# Patient Record
Sex: Male | Born: 1985 | Race: White | Hispanic: No | Marital: Single | Smoking: Current every day smoker
Health system: Southern US, Community
[De-identification: ages and names within clinical notes are randomized; demographics above are authoritative.]

## PROBLEM LIST (undated history)

## (undated) HISTORY — PX: FRACTURE SURGERY: SHX138

---
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
## 2005-07-14 ENCOUNTER — Emergency Department (HOSPITAL_COMMUNITY): Admission: EM | Admit: 2005-07-14 | Discharge: 2005-07-14 | Payer: Self-pay | Admitting: *Deleted

## 2019-02-08 ENCOUNTER — Emergency Department (HOSPITAL_COMMUNITY): Payer: Self-pay

## 2019-02-08 ENCOUNTER — Other Ambulatory Visit: Payer: Self-pay

## 2019-02-08 ENCOUNTER — Emergency Department (HOSPITAL_COMMUNITY)
Admission: EM | Admit: 2019-02-08 | Discharge: 2019-02-08 | Disposition: A | Discharge status: Home / Self Care | Payer: Self-pay | Attending: Emergency Medicine | Admitting: Emergency Medicine

## 2019-02-08 ENCOUNTER — Encounter (HOSPITAL_COMMUNITY): Payer: Self-pay | Admitting: Oncology

## 2019-02-08 DIAGNOSIS — S52572A Other intraarticular fracture of lower end of left radius, initial encounter for closed fracture: Secondary | ICD-10-CM | POA: Insufficient documentation

## 2019-02-08 DIAGNOSIS — Y9351 Activity, roller skating (inline) and skateboarding: Secondary | ICD-10-CM | POA: Insufficient documentation

## 2019-02-08 DIAGNOSIS — Y999 Unspecified external cause status: Secondary | ICD-10-CM | POA: Insufficient documentation

## 2019-02-08 DIAGNOSIS — Y929 Unspecified place or not applicable: Secondary | ICD-10-CM | POA: Insufficient documentation

## 2019-02-08 DIAGNOSIS — W102XXA Fall (on)(from) incline, initial encounter: Secondary | ICD-10-CM | POA: Insufficient documentation

## 2019-02-08 LAB — CBC WITH DIFFERENTIAL/PLATELET
Abs Immature Granulocytes: 0.04 10*3/uL (ref 0.00–0.07)
Basophils Absolute: 0.1 10*3/uL (ref 0.0–0.1)
Basophils Relative: 1 %
Eosinophils Absolute: 0.2 10*3/uL (ref 0.0–0.5)
Eosinophils Relative: 1 %
HCT: 52.1 % — ABNORMAL HIGH (ref 39.0–52.0)
Hemoglobin: 18.3 g/dL — ABNORMAL HIGH (ref 13.0–17.0)
Immature Granulocytes: 0 %
Lymphocytes Relative: 31 %
Lymphs Abs: 3.6 10*3/uL (ref 0.7–4.0)
MCH: 33.2 pg (ref 26.0–34.0)
MCHC: 35.1 g/dL (ref 30.0–36.0)
MCV: 94.4 fL (ref 80.0–100.0)
Monocytes Absolute: 0.9 10*3/uL (ref 0.1–1.0)
Monocytes Relative: 8 %
Neutro Abs: 6.9 10*3/uL (ref 1.7–7.7)
Neutrophils Relative %: 59 %
Platelets: 274 10*3/uL (ref 150–400)
RBC: 5.52 MIL/uL (ref 4.22–5.81)
RDW: 13.4 % (ref 11.5–15.5)
WBC: 11.7 10*3/uL — ABNORMAL HIGH (ref 4.0–10.5)
nRBC: 0 % (ref 0.0–0.2)

## 2019-02-08 LAB — BASIC METABOLIC PANEL
Anion gap: 17 — ABNORMAL HIGH (ref 5–15)
BUN: 16 mg/dL (ref 6–20)
CO2: 17 mmol/L — ABNORMAL LOW (ref 22–32)
Calcium: 9.5 mg/dL (ref 8.9–10.3)
Chloride: 99 mmol/L (ref 98–111)
Creatinine, Ser: 1.22 mg/dL (ref 0.61–1.24)
GFR calc Af Amer: >60 mL/min (ref >60)
GFR calc non Af Amer: >60 mL/min (ref >60)
Glucose, Bld: 145 mg/dL — ABNORMAL HIGH (ref 70–99)
Potassium: 3.4 mmol/L — ABNORMAL LOW (ref 3.5–5.1)
Sodium: 133 mmol/L — ABNORMAL LOW (ref 135–145)

## 2019-02-08 MED ORDER — OXYCODONE-ACETAMINOPHEN 5-325 MG PO TABS: 1.0000 | ORAL_TABLET | ORAL | 0 refills | Status: DC | PRN

## 2019-02-08 MED ORDER — HYDROMORPHONE HCL 1 MG/ML IJ SOLN
1.0000 mg | Freq: Once | INTRAMUSCULAR | Status: AC
  Administered 2019-02-08: 20:00:00 1 mg via INTRAVENOUS
  Filled 2019-02-08: qty 1

## 2019-02-08 MED ORDER — HYDROMORPHONE HCL 1 MG/ML IJ SOLN
1.0000 mg | Freq: Once | INTRAMUSCULAR | Status: AC
  Administered 2019-02-08: 22:00:00 1 mg via INTRAVENOUS
  Filled 2019-02-08: qty 1

## 2019-02-08 MED ORDER — HYDROMORPHONE HCL 1 MG/ML IJ SOLN
1.0000 mg | Freq: Once | INTRAMUSCULAR | Status: AC
  Administered 2019-02-08: 21:00:00 1 mg via INTRAVENOUS
  Filled 2019-02-08: qty 1

## 2019-02-08 MED ORDER — PROPOFOL 10 MG/ML IV BOLUS
INTRAVENOUS | Status: AC
  Filled 2019-02-08: qty 20

## 2019-02-08 MED ORDER — HYDROMORPHONE HCL 1 MG/ML IJ SOLN
1.0000 mg | Freq: Once | INTRAMUSCULAR | Status: AC
  Administered 2019-02-08: 19:00:00 1 mg via INTRAVENOUS
  Filled 2019-02-08: qty 1

## 2019-02-08 MED ORDER — OXYCODONE-ACETAMINOPHEN 5-325 MG PO TABS: 1.0000 | ORAL_TABLET | Freq: Once | ORAL | Status: DC

## 2019-02-08 MED ORDER — PROPOFOL 10 MG/ML IV BOLUS
INTRAVENOUS | Status: AC | PRN
  Administered 2019-02-08 (×2): 10 mg via INTRAVENOUS
  Administered 2019-02-08: 50 mg via INTRAVENOUS
  Administered 2019-02-08: 20 mg via INTRAVENOUS
  Administered 2019-02-08 (×2): 10 mg via INTRAVENOUS
  Administered 2019-02-08: 20 mg via INTRAVENOUS

## 2019-02-08 MED ORDER — ONDANSETRON HCL 4 MG/2ML IJ SOLN
4.0000 mg | Freq: Once | INTRAMUSCULAR | Status: AC
  Administered 2019-02-08: 19:00:00 4 mg via INTRAVENOUS
  Filled 2019-02-08: qty 2

## 2019-02-08 NOTE — ED Notes (Signed)
Patient transported to X-ray 

## 2019-02-08 NOTE — ED Provider Notes (Signed)
Medical screening examination/treatment/procedure(s) were conducted as a shared visit with non-physician practitioner(s) and myself.  I personally evaluated the patient during the encounter. Briefly, the patient is a 33 y.o. male with no significant medical history presents the ED with left wrist deformity after skating.  Appears neurologically intact.  Normal pulses.  Obvious deformity.  X-ray showed left wrist fracture.  Patient was sedated with propofol by myself and my physician assistant reduced the fracture.  Repeat x-ray showed much improved alignment.  Patient was placed in a splint.  Will prescribe pain medication.  We will have him follow-up with hand surgery.  This chart was dictated using voice recognition software.  Despite best efforts to proofread,  errors can occur which can change the documentation meaning.   .Sedation  Date/Time: 02/08/2019 9:26 PM Performed by: Lennice Sites, DO Authorized by: Lennice Sites, DO   Consent:    Consent obtained:  Verbal   Consent given by:  Patient   Risks discussed:  Allergic reaction, dysrhythmia, inadequate sedation, nausea, prolonged hypoxia resulting in organ damage, prolonged sedation necessitating reversal, respiratory compromise necessitating ventilatory assistance and intubation and vomiting   Alternatives discussed:  Analgesia without sedation, anxiolysis and regional anesthesia Universal protocol:    Procedure explained and questions answered to patient or proxy's satisfaction: yes     Relevant documents present and verified: yes     Test results available and properly labeled: yes     Imaging studies available: yes     Required blood products, implants, devices, and special equipment available: yes     Site/side marked: yes     Immediately prior to procedure a time out was called: yes     Patient identity confirmation method:  Verbally with patient Indications:    Procedure performed:  Fracture reduction   Procedure necessitating  sedation performed by:  Physician performing sedation Pre-sedation assessment:    Time since last food or drink:  Hours   ASA classification: class 1 - normal, healthy patient     Neck mobility: normal     Mouth opening:  3 or more finger widths   Thyromental distance:  4 finger widths   Mallampati score:  I - soft palate, uvula, fauces, pillars visible   Pre-sedation assessments completed and reviewed: airway patency, cardiovascular function, hydration status, mental status, nausea/vomiting, pain level, respiratory function and temperature   Immediate pre-procedure details:    Reassessment: Patient reassessed immediately prior to procedure     Reviewed: vital signs, relevant labs/tests and NPO status     Verified: bag valve mask available, emergency equipment available, intubation equipment available, IV patency confirmed, oxygen available and suction available   Procedure details (see MAR for exact dosages):    Preoxygenation:  Nasal cannula   Sedation:  Propofol   Intra-procedure monitoring:  Blood pressure monitoring, cardiac monitor, continuous pulse oximetry, frequent LOC assessments, frequent vital sign checks and continuous capnometry   Intra-procedure events: none     Total Provider sedation time (minutes):  30 Post-procedure details:    Post-sedation assessment completed:  02/08/2019 9:27 PM   Attendance: Constant attendance by certified staff until patient recovered     Recovery: Patient returned to pre-procedure baseline     Post-sedation assessments completed and reviewed: airway patency, cardiovascular function, hydration status, mental status, nausea/vomiting, pain level, respiratory function and temperature     Patient is stable for discharge or admission: yes     Patient tolerance:  Tolerated well, no immediate complications  EKG Interpretation None           Virgina NorfolkCuratolo, Shantale Holtmeyer, DO 02/08/19 2127

## 2019-02-08 NOTE — ED Triage Notes (Signed)
Pt has deformity to left wrist.  Pt was skating states he was doing a jump, was approximately 8 feet in the air and landed on his left wrist.  Pt rates pain 10/10.

## 2019-02-08 NOTE — ED Notes (Signed)
Pt is A&O x 4.  Respirations even and unlabored.  Pt c/o pain.  Will inform [place], [place].

## 2019-02-08 NOTE — ED Provider Notes (Signed)
[facility] EM[facility]GENC[facility] Provider Note   CSN: [id number] Arrival date & time: 02/08/19  1854     History   Chief Complaint No chief complaint on file.   HPI Scott [name] is a 33 y.o. male.     The history is provided by the patient. No language interpreter was used.  Wrist Injury    33 year old male presenting for evaluation of wrist injury.  Patient states he was skating prior to arrival when he jumped off a ramp and landed awkwardly on his left wrist.  He report acute onset of severe pain about the wrist radiates to his left elbow.  Pain is severe, persistent without any numbness.  No other injury.  No shoulder pain.  No past medical history on file.  There are no active problems to display for this patient.   Past Surgical History:  Procedure Laterality Date  . FRACTURE SURGERY          Home Medications    Prior to Admission medications   Not on File    Family History No family history on file.  Social History Social History   Tobacco Use  . Smoking status: Not on file  Substance Use Topics  . Alcohol use: Not on file  . Drug use: Not on file     Allergies   Patient has no allergy information on record.   Review of Systems Review of Systems  All other systems reviewed and are negative.    Physical Exam Updated Vital Signs BP (!) 145/100 (BP Location: Right Arm)   Pulse (!) 105   Temp 98.4 F (36.9 C) (Oral)   Resp 20   SpO2 95%   Physical Exam Vitals signs and nursing note reviewed.  Constitutional:      Appearance: He is well-developed. He is diaphoretic.     Comments: Patient appears very uncomfortable, crying, diaphoretic  HENT:     Head: Atraumatic.  Eyes:     Conjunctiva/sclera: Conjunctivae normal.  Neck:     Musculoskeletal: Neck supple.  Cardiovascular:     Rate and Rhythm: Tachycardia present.  Pulmonary:     Comments: Mildly tachypneic Musculoskeletal:        General: Deformity (Left  wrist: Obvious close deformity about the wrist with intact radial pulse.  Exquisitely tender to palpation.  Tenderness to left posterior elbow on palpation with decreased range of motion.  Sensation is intact to left hand.) present.  Skin:    Findings: No rash.  Neurological:     Mental Status: He is alert.      ED Treatments / Results  Labs (all labs ordered are listed, but only abnormal results are displayed) Labs Reviewed - No data to display  EKG None  Radiology Dg Elbow Complete Left  Result Date: 02/08/2019 CLINICAL DATA:  Pain following fall EXAM: LEFT ELBOW - COMPLETE 3+ VIEW COMPARISON:  None. FINDINGS: Frontal, lateral, and bilateral oblique views were obtained. No appreciable fracture or dislocation. Joint spaces appear normal. No joint effusion. No erosive change. IMPRESSION: No fracture or dislocation. No joint effusion. No appreciable arthropathic change. Electronically Signed   By: Bretta BangWilliam  Woodruff III M.D.   On: 02/08/2019 19:50   Dg Wrist Complete Left  Addendum Date: 02/08/2019   ADDENDUM REPORT: 02/08/2019 21:11 ADDENDUM: There is also avulsion of the ulnar styloid. Electronically Signed   By: Bretta BangWilliam  Woodruff III M.D.   On: 02/08/2019 21:11   Result Date: 02/08/2019 CLINICAL DATA:  Pain following  fall EXAM: LEFT WRIST - COMPLETE 3+ VIEW COMPARISON:  None. FINDINGS: Frontal, oblique, and lateral views obtained. There is a comminuted fracture of the distal radial metaphysis with dorsal angulation distally. Fracture fragments extend into the radiocarpal joint as well as into the distal diaphysis of the radius. No other fractures are evident. No dislocation. Joint spaces appear normal. No erosive change. IMPRESSION: Comminuted fracture distal radius with dorsal angulation distally. Fracture fragments extend into the radiocarpal joint. No dislocation. No other fractures. No appreciable arthropathy. Electronically Signed: By: Lowella Grip III M.D. On: 02/08/2019 19:49    Dg Wrist Complete Left  Result Date: 02/08/2019 CLINICAL DATA:  Post reduction for fracture EXAM: LEFT WRIST - COMPLETE 3+ VIEW COMPARISON:  February 08, 2019 study obtained earlier in the day FINDINGS: Frontal, oblique, and lateral views obtained. Immobilization device in place. Comminuted fracture of the distal radius is in overall near anatomic alignment. Note that there are fracture fragments which are displaced dorsally with respect to the remainder of the radius. There is avulsion of the ulnar styloid. No dislocation. No appreciable arthropathy. IMPRESSION: The comminuted fracture of the distal radius is now in overall near anatomic alignment, although there are fracture fragments displaced dorsally in the distal metaphyseal region. There is avulsion of the ulnar styloid. No other fractures are evident. No dislocation. No evident arthropathy. Electronically Signed   By: Lowella Grip III M.D.   On: 02/08/2019 21:11    Procedures .Ortho Injury Treatment  Date/Time: 02/08/2019 9:23 PM Performed by: Domenic Moras, PA-C Authorized by: Domenic Moras, PA-C   Consent:    Consent obtained:  Written   Consent given by:  Patient   Risks discussed:  Fracture, nerve damage and recurrent dislocation   Alternatives discussed:  Referral and delayed treatmentInjury location: wrist Location details: left wrist Injury type: fracture Fracture type: distal radius and distal radius and ulnar styloid Pre-procedure neurovascular assessment: neurovascularly intact Pre-procedure distal perfusion: normal Pre-procedure neurological function: normal Pre-procedure range of motion: reduced  Anesthesia: Local anesthesia used: no  Patient sedated: Yes. Refer to sedation procedure documentation for details of sedation. Manipulation performed: yes Skeletal traction used: yes Reduction successful: yes X-ray confirmed reduction: yes Immobilization: splint and sling Splint type: sugar tong Supplies used: Ortho-Glass  Post-procedure neurovascular assessment: post-procedure neurovascularly intact Post-procedure distal perfusion: normal Post-procedure range of motion: improved Patient tolerance: patient tolerated the procedure well with no immediate complications    (including critical care time)  Medications Ordered in ED Medications  HYDROmorphone (DILAUDID) injection 1 mg (has no administration in time range)  HYDROmorphone (DILAUDID) injection 1 mg (1 mg Intravenous Given 02/08/19 1915)  ondansetron (ZOFRAN) injection 4 mg (4 mg Intravenous Given 02/08/19 1915)     Initial Impression / Assessment and Plan / ED Course  I have reviewed the triage vital signs and the nursing notes.  Pertinent labs & imaging results that were available during my care of the patient were reviewed by me and considered in my medical decision making (see chart for details).        BP (!) 153/91   Pulse 74   Temp 98.4 F (36.9 C) (Oral)   Resp 18   Ht 6' (1.829 m)   Wt 68 kg   SpO2 96%   BMI 20.34 kg/m    Final Clinical Impressions(s) / ED Diagnoses   Final diagnoses:  Other closed intra-articular fracture of distal end of left radius, initial encounter    ED Discharge Orders  Ordered    oxyCODONE-acetaminophen (PERCOCET) 5-325 MG tablet  Every 4 hours PRN,   Status:  Discontinued     02/08/19 2133    oxyCODONE-acetaminophen (PERCOCET) 5-325 MG tablet  Every 4 hours PRN     02/08/19 2137         8:18 PM Patient had a mechanical fall and injured his left wrist.  He has an obvious close deformity on initial examination.  He is neurovascular intact.  X-ray of the left wrist demonstrate comminuted fracture of the distal radius with dorsal angulation distally with fragment extending to the radiocarpal joint.  will perform conscious sedation and reduce wrist, place patient in a sugar tong splint.  9:23 PM Successful reduction of left wrist fracture with near anatomical alignment on postreduction  film.  Reduction was done under supervision and guidance of Dr. Lockie Molauratolo.  Appreciate consultation from hand specialist Dr. Izora Ribasoley who agrees to see patient next week for further care.   Fayrene Helperran, Towana Stenglein, PA-C 02/08/19 2137    Virgina Norfolkuratolo, Adam, DO 02/08/19 2352

## 2019-02-08 NOTE — Discharge Instructions (Addendum)
Please call and follow up closely with hand specialist next week for further management of your broken wrist.  Your elbow is normal.  Take pain medication as needed.

## 2020-07-07 IMAGING — CR LEFT ELBOW - COMPLETE 3+ VIEW
4 series · 4 of 4 positions shown · non-contrast
Comparison: None.

CLINICAL DATA: Pain following fall

EXAM:
LEFT ELBOW - COMPLETE 3+ VIEW

[elbow ap]
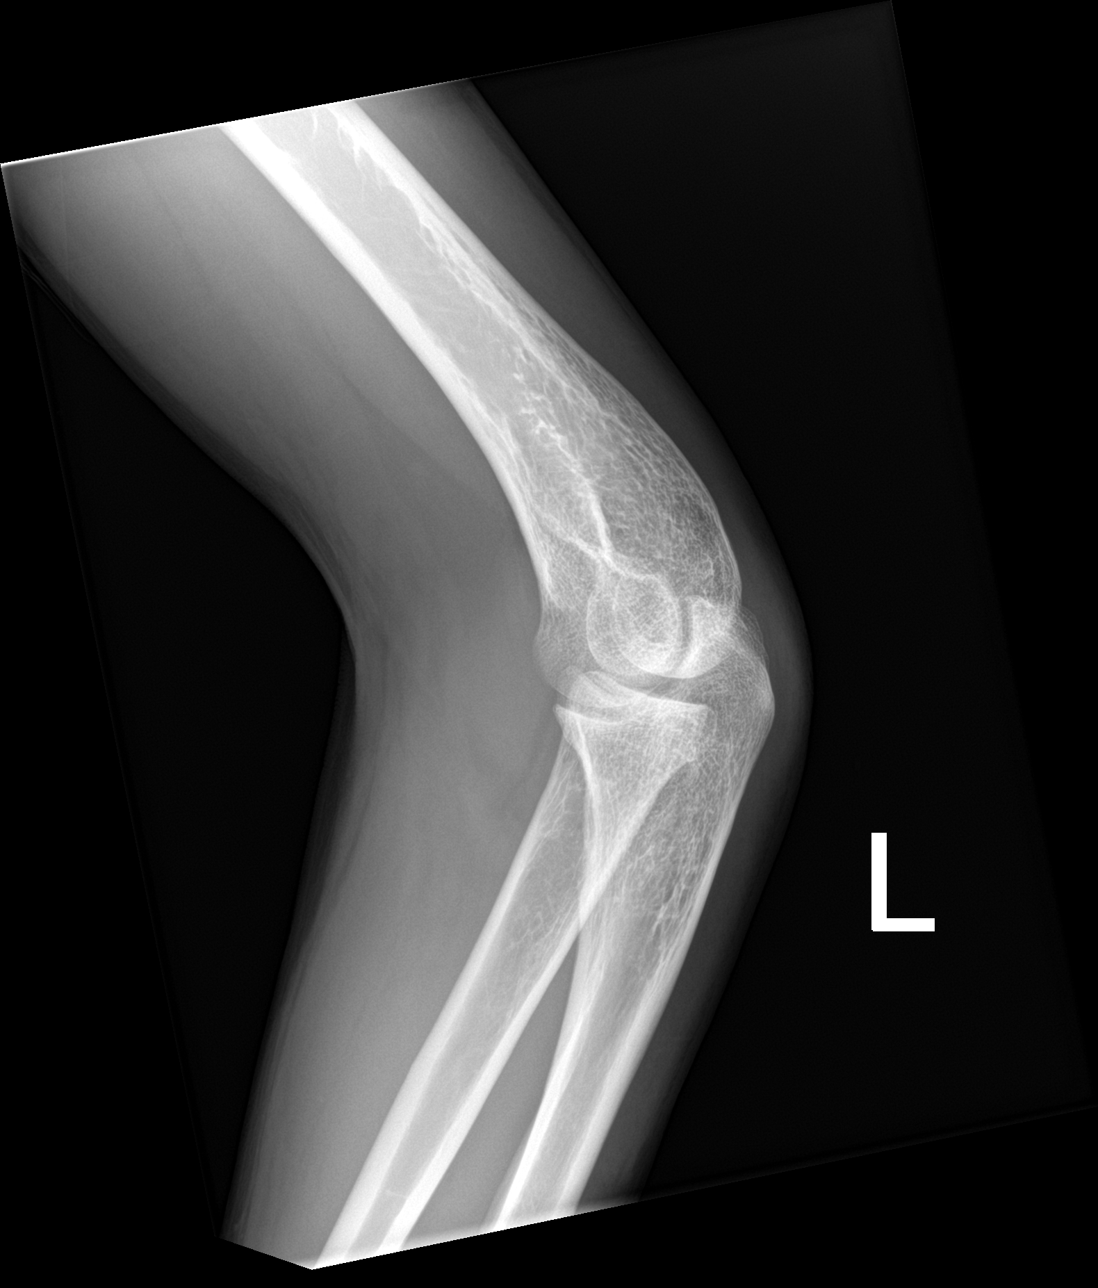

[elbow obl (1 of 2)]
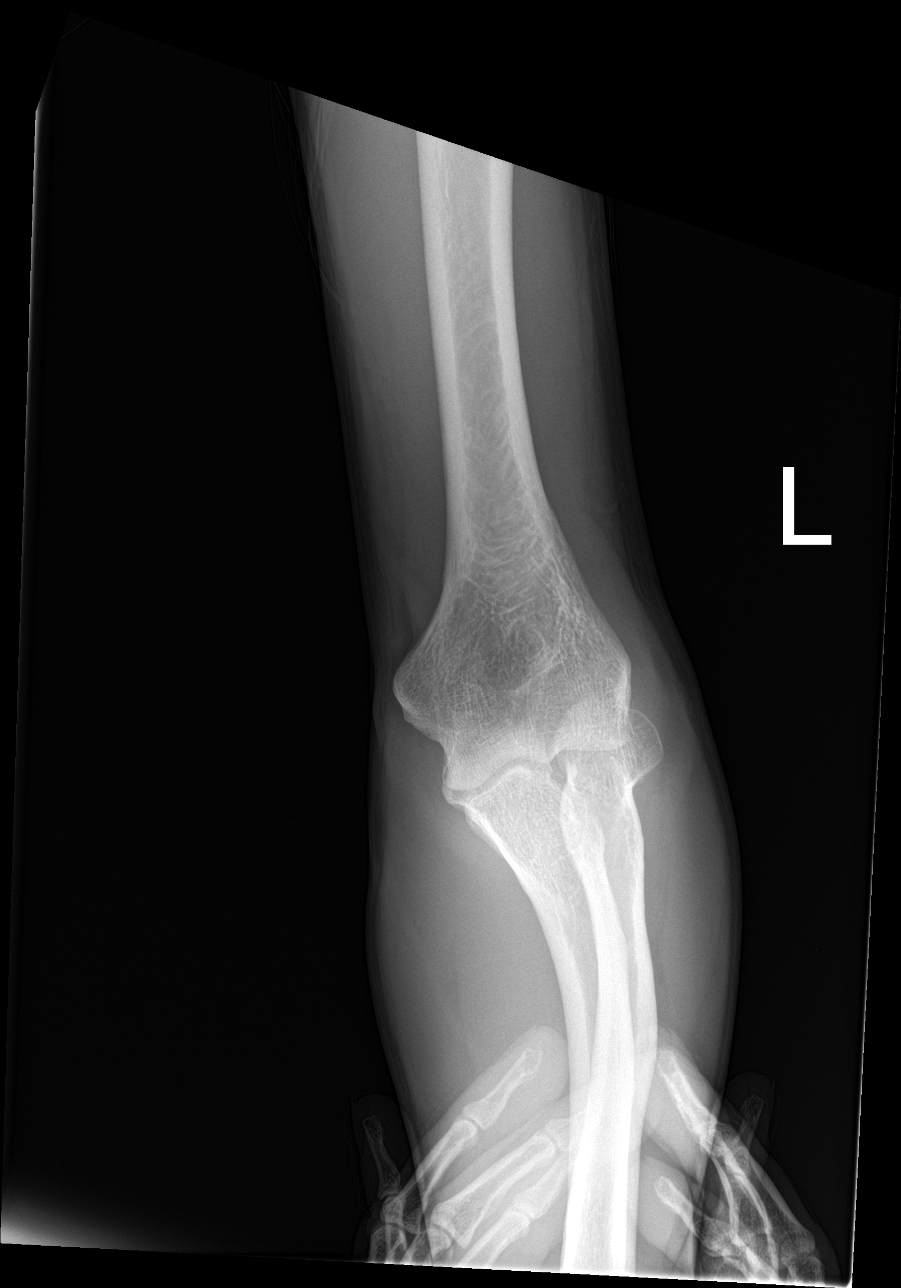

[elbow obl (2 of 2)]
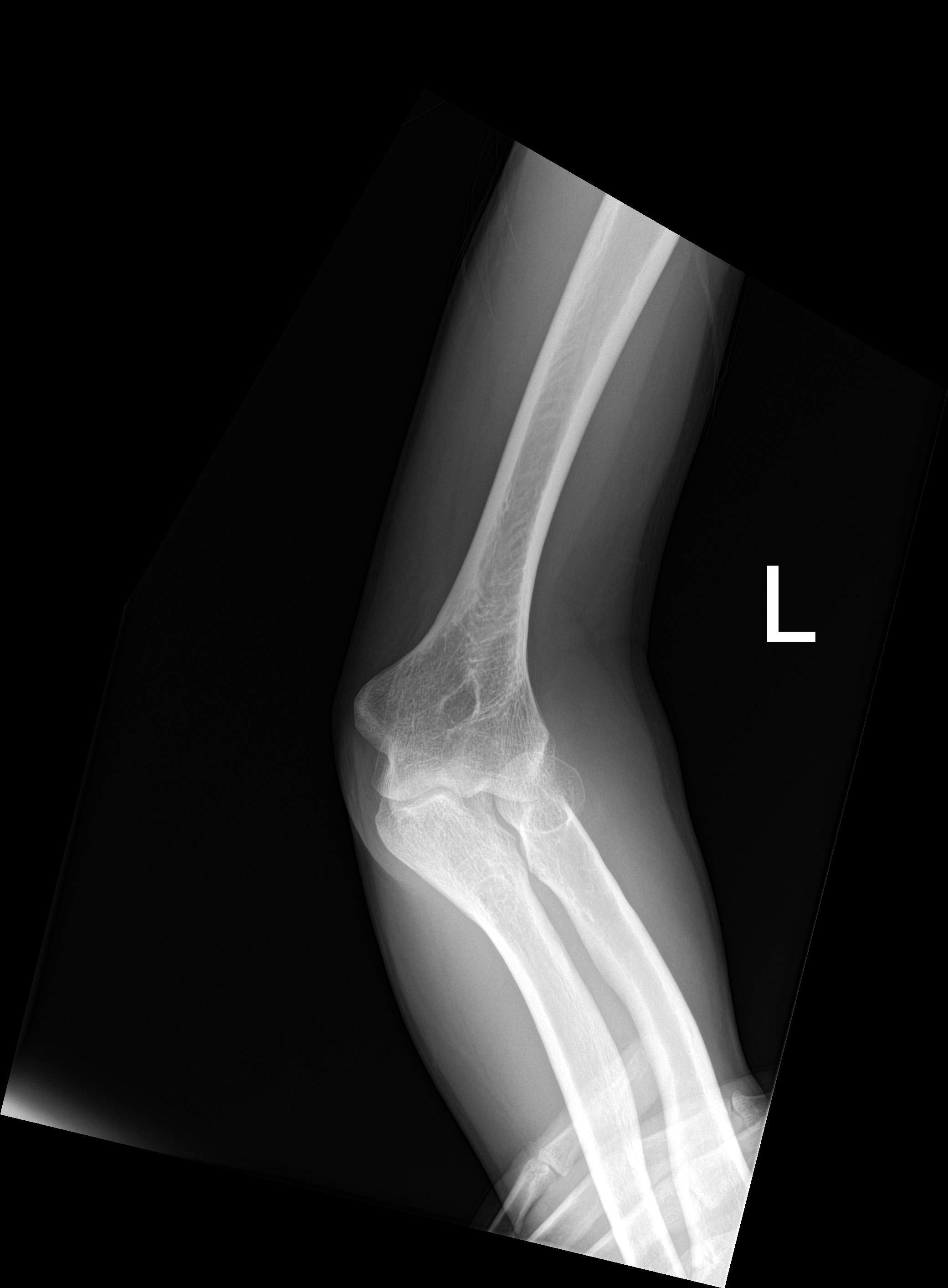

[elbow lat]
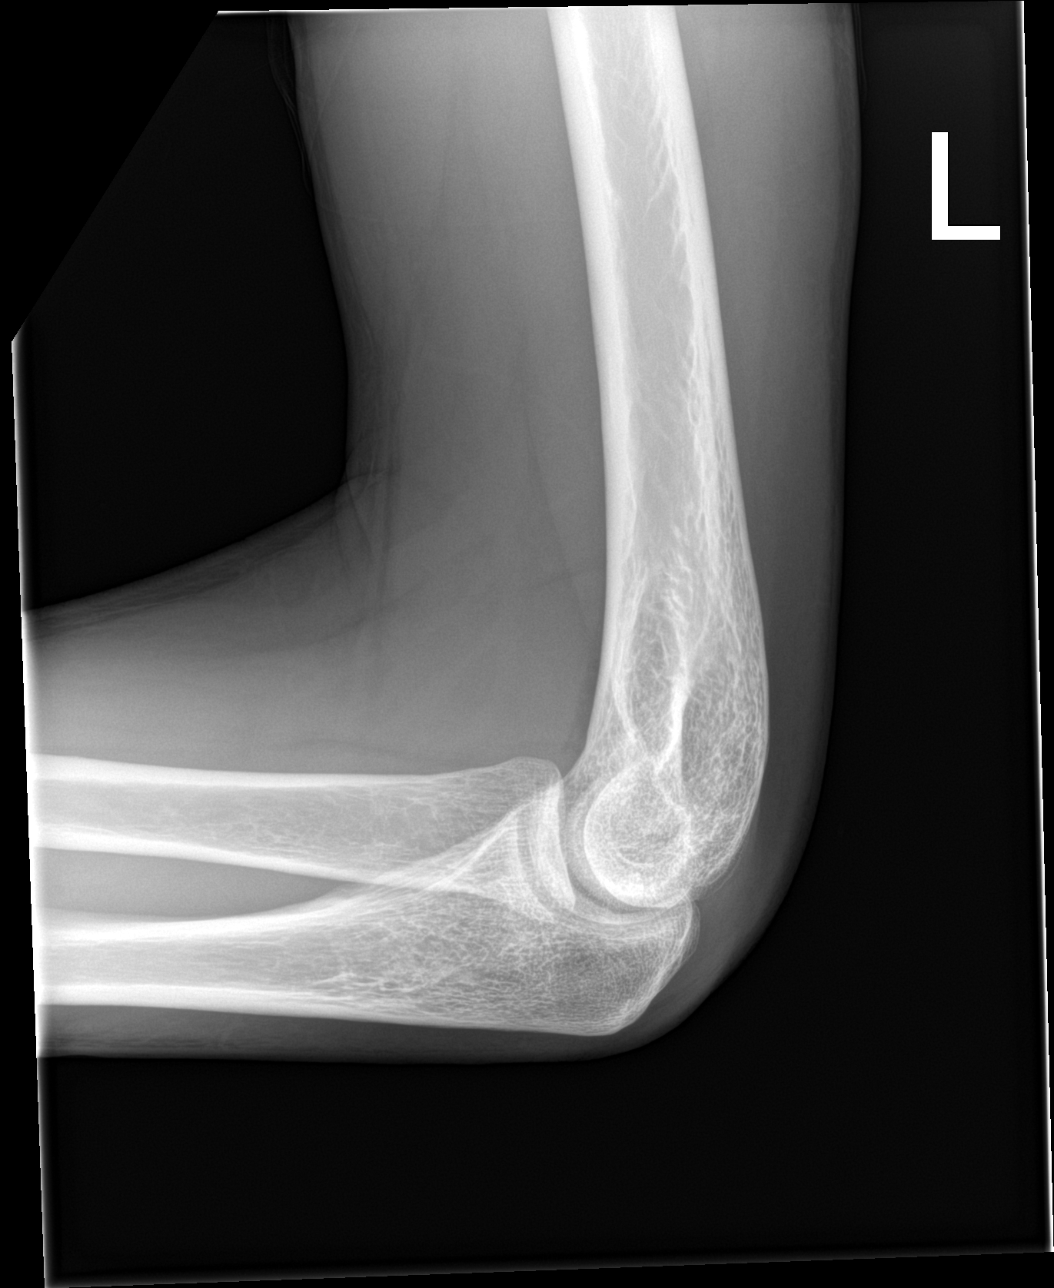

[4 of 4 positions shown; findings below may reference images not displayed]

FINDINGS: Frontal, lateral, and bilateral oblique views were obtained. No
appreciable fracture or dislocation. Joint spaces appear normal. No
joint effusion. No erosive change.
IMPRESSION: No fracture or dislocation. No joint effusion. No appreciable
arthropathic change.

## 2021-02-15 ENCOUNTER — Inpatient Hospital Stay (HOSPITAL_COMMUNITY)
Admission: EM | Admit: 2021-02-15 | Discharge: 2021-04-01 | DRG: 004 | Disposition: A | Discharge status: Rehabilitation Facility | Payer: 59 | Attending: Surgery | Admitting: Surgery

## 2021-02-15 ENCOUNTER — Emergency Department (HOSPITAL_COMMUNITY): Payer: 59

## 2021-02-15 DIAGNOSIS — Z452 Encounter for adjustment and management of vascular access device: Secondary | ICD-10-CM

## 2021-02-15 DIAGNOSIS — S020XXA Fracture of vault of skull, initial encounter for closed fracture: Secondary | ICD-10-CM | POA: Diagnosis present

## 2021-02-15 DIAGNOSIS — I82409 Acute embolism and thrombosis of unspecified deep veins of unspecified lower extremity: Secondary | ICD-10-CM | POA: Diagnosis not present

## 2021-02-15 DIAGNOSIS — S06819A Injury of right internal carotid artery, intracranial portion, not elsewhere classified with loss of consciousness of unspecified duration, initial encounter: Secondary | ICD-10-CM | POA: Diagnosis present

## 2021-02-15 DIAGNOSIS — Z4659 Encounter for fitting and adjustment of other gastrointestinal appliance and device: Secondary | ICD-10-CM

## 2021-02-15 DIAGNOSIS — N390 Urinary tract infection, site not specified: Secondary | ICD-10-CM | POA: Diagnosis not present

## 2021-02-15 DIAGNOSIS — S069X9A Unspecified intracranial injury with loss of consciousness of unspecified duration, initial encounter: Secondary | ICD-10-CM | POA: Diagnosis present

## 2021-02-15 DIAGNOSIS — Z9289 Personal history of other medical treatment: Secondary | ICD-10-CM

## 2021-02-15 DIAGNOSIS — L8990 Pressure ulcer of unspecified site, unspecified stage: Secondary | ICD-10-CM | POA: Insufficient documentation

## 2021-02-15 DIAGNOSIS — Z20822 Contact with and (suspected) exposure to covid-19: Secondary | ICD-10-CM | POA: Diagnosis present

## 2021-02-15 DIAGNOSIS — F17210 Nicotine dependence, cigarettes, uncomplicated: Secondary | ICD-10-CM | POA: Diagnosis present

## 2021-02-15 DIAGNOSIS — R41844 Frontal lobe and executive function deficit: Secondary | ICD-10-CM | POA: Diagnosis not present

## 2021-02-15 DIAGNOSIS — I82451 Acute embolism and thrombosis of right peroneal vein: Secondary | ICD-10-CM | POA: Diagnosis not present

## 2021-02-15 DIAGNOSIS — R4182 Altered mental status, unspecified: Secondary | ICD-10-CM | POA: Diagnosis present

## 2021-02-15 DIAGNOSIS — E870 Hyperosmolality and hypernatremia: Secondary | ICD-10-CM | POA: Diagnosis not present

## 2021-02-15 DIAGNOSIS — S01112A Laceration without foreign body of left eyelid and periocular area, initial encounter: Secondary | ICD-10-CM | POA: Diagnosis present

## 2021-02-15 DIAGNOSIS — S0291XA Unspecified fracture of skull, initial encounter for closed fracture: Secondary | ICD-10-CM

## 2021-02-15 DIAGNOSIS — T07XXXA Unspecified multiple injuries, initial encounter: Secondary | ICD-10-CM | POA: Diagnosis not present

## 2021-02-15 DIAGNOSIS — S42192A Fracture of other part of scapula, left shoulder, initial encounter for closed fracture: Secondary | ICD-10-CM | POA: Diagnosis present

## 2021-02-15 DIAGNOSIS — S06829A Injury of left internal carotid artery, intracranial portion, not elsewhere classified with loss of consciousness of unspecified duration, initial encounter: Secondary | ICD-10-CM | POA: Diagnosis present

## 2021-02-15 DIAGNOSIS — J9600 Acute respiratory failure, unspecified whether with hypoxia or hypercapnia: Secondary | ICD-10-CM

## 2021-02-15 DIAGNOSIS — Y92410 Unspecified street and highway as the place of occurrence of the external cause: Secondary | ICD-10-CM

## 2021-02-15 DIAGNOSIS — S0181XA Laceration without foreign body of other part of head, initial encounter: Secondary | ICD-10-CM | POA: Diagnosis present

## 2021-02-15 DIAGNOSIS — D75838 Other thrombocytosis: Secondary | ICD-10-CM | POA: Diagnosis not present

## 2021-02-15 DIAGNOSIS — S069X0S Unspecified intracranial injury without loss of consciousness, sequela: Secondary | ICD-10-CM | POA: Diagnosis not present

## 2021-02-15 DIAGNOSIS — S022XXA Fracture of nasal bones, initial encounter for closed fracture: Secondary | ICD-10-CM | POA: Diagnosis present

## 2021-02-15 DIAGNOSIS — S42022S Displaced fracture of shaft of left clavicle, sequela: Secondary | ICD-10-CM | POA: Diagnosis not present

## 2021-02-15 DIAGNOSIS — S42102A Fracture of unspecified part of scapula, left shoulder, initial encounter for closed fracture: Secondary | ICD-10-CM | POA: Diagnosis present

## 2021-02-15 DIAGNOSIS — Z23 Encounter for immunization: Secondary | ICD-10-CM

## 2021-02-15 DIAGNOSIS — J939 Pneumothorax, unspecified: Secondary | ICD-10-CM

## 2021-02-15 DIAGNOSIS — S0219XA Other fracture of base of skull, initial encounter for closed fracture: Secondary | ICD-10-CM | POA: Diagnosis present

## 2021-02-15 DIAGNOSIS — J80 Acute respiratory distress syndrome: Secondary | ICD-10-CM | POA: Diagnosis present

## 2021-02-15 DIAGNOSIS — S065X9A Traumatic subdural hemorrhage with loss of consciousness of unspecified duration, initial encounter: Secondary | ICD-10-CM | POA: Diagnosis present

## 2021-02-15 DIAGNOSIS — I609 Nontraumatic subarachnoid hemorrhage, unspecified: Secondary | ICD-10-CM

## 2021-02-15 DIAGNOSIS — Z79899 Other long term (current) drug therapy: Secondary | ICD-10-CM

## 2021-02-15 DIAGNOSIS — S066X9A Traumatic subarachnoid hemorrhage with loss of consciousness of unspecified duration, initial encounter: Principal | ICD-10-CM | POA: Diagnosis present

## 2021-02-15 DIAGNOSIS — Z930 Tracheostomy status: Secondary | ICD-10-CM

## 2021-02-15 DIAGNOSIS — J189 Pneumonia, unspecified organism: Secondary | ICD-10-CM

## 2021-02-15 DIAGNOSIS — R531 Weakness: Secondary | ICD-10-CM | POA: Diagnosis not present

## 2021-02-15 DIAGNOSIS — R000 Tachycardia, unspecified: Secondary | ICD-10-CM | POA: Diagnosis not present

## 2021-02-15 DIAGNOSIS — R339 Retention of urine, unspecified: Secondary | ICD-10-CM | POA: Diagnosis present

## 2021-02-15 DIAGNOSIS — R1310 Dysphagia, unspecified: Secondary | ICD-10-CM | POA: Diagnosis present

## 2021-02-15 DIAGNOSIS — R0902 Hypoxemia: Secondary | ICD-10-CM

## 2021-02-15 DIAGNOSIS — J15212 Pneumonia due to Methicillin resistant Staphylococcus aureus: Secondary | ICD-10-CM | POA: Diagnosis present

## 2021-02-15 DIAGNOSIS — Z01818 Encounter for other preprocedural examination: Secondary | ICD-10-CM

## 2021-02-15 DIAGNOSIS — S42112A Displaced fracture of body of scapula, left shoulder, initial encounter for closed fracture: Secondary | ICD-10-CM | POA: Diagnosis present

## 2021-02-15 DIAGNOSIS — S069XAA Unspecified intracranial injury with loss of consciousness status unknown, initial encounter: Secondary | ICD-10-CM | POA: Diagnosis present

## 2021-02-15 DIAGNOSIS — E739 Lactose intolerance, unspecified: Secondary | ICD-10-CM | POA: Diagnosis present

## 2021-02-15 DIAGNOSIS — S40212A Abrasion of left shoulder, initial encounter: Secondary | ICD-10-CM | POA: Diagnosis present

## 2021-02-15 DIAGNOSIS — J9601 Acute respiratory failure with hypoxia: Secondary | ICD-10-CM

## 2021-02-15 DIAGNOSIS — Z781 Physical restraint status: Secondary | ICD-10-CM

## 2021-02-15 DIAGNOSIS — F1020 Alcohol dependence, uncomplicated: Secondary | ICD-10-CM | POA: Diagnosis present

## 2021-02-15 DIAGNOSIS — J9690 Respiratory failure, unspecified, unspecified whether with hypoxia or hypercapnia: Secondary | ICD-10-CM

## 2021-02-15 DIAGNOSIS — R1312 Dysphagia, oropharyngeal phase: Secondary | ICD-10-CM | POA: Diagnosis not present

## 2021-02-15 DIAGNOSIS — I9589 Other hypotension: Secondary | ICD-10-CM | POA: Diagnosis not present

## 2021-02-15 DIAGNOSIS — R4020 Unspecified coma: Secondary | ICD-10-CM | POA: Diagnosis not present

## 2021-02-15 DIAGNOSIS — E876 Hypokalemia: Secondary | ICD-10-CM | POA: Diagnosis not present

## 2021-02-15 DIAGNOSIS — S069X0D Unspecified intracranial injury without loss of consciousness, subsequent encounter: Secondary | ICD-10-CM | POA: Diagnosis not present

## 2021-02-15 DIAGNOSIS — S069X3S Unspecified intracranial injury with loss of consciousness of 1 hour to 5 hours 59 minutes, sequela: Secondary | ICD-10-CM | POA: Diagnosis not present

## 2021-02-15 DIAGNOSIS — S42022A Displaced fracture of shaft of left clavicle, initial encounter for closed fracture: Secondary | ICD-10-CM | POA: Diagnosis present

## 2021-02-15 DIAGNOSIS — J151 Pneumonia due to Pseudomonas: Secondary | ICD-10-CM | POA: Diagnosis not present

## 2021-02-15 DIAGNOSIS — Z0189 Encounter for other specified special examinations: Secondary | ICD-10-CM

## 2021-02-15 DIAGNOSIS — H6093 Unspecified otitis externa, bilateral: Secondary | ICD-10-CM | POA: Diagnosis not present

## 2021-02-15 DIAGNOSIS — I10 Essential (primary) hypertension: Secondary | ICD-10-CM | POA: Diagnosis not present

## 2021-02-15 DIAGNOSIS — R140 Abdominal distension (gaseous): Secondary | ICD-10-CM

## 2021-02-15 DIAGNOSIS — S066X9D Traumatic subarachnoid hemorrhage with loss of consciousness of unspecified duration, subsequent encounter: Secondary | ICD-10-CM | POA: Diagnosis not present

## 2021-02-15 DIAGNOSIS — R509 Fever, unspecified: Secondary | ICD-10-CM

## 2021-02-15 DIAGNOSIS — I160 Hypertensive urgency: Secondary | ICD-10-CM | POA: Diagnosis present

## 2021-02-15 DIAGNOSIS — S42009A Fracture of unspecified part of unspecified clavicle, initial encounter for closed fracture: Secondary | ICD-10-CM | POA: Diagnosis present

## 2021-02-15 DIAGNOSIS — M25512 Pain in left shoulder: Secondary | ICD-10-CM | POA: Diagnosis not present

## 2021-02-15 DIAGNOSIS — T1490XA Injury, unspecified, initial encounter: Secondary | ICD-10-CM

## 2021-02-15 DIAGNOSIS — E8770 Fluid overload, unspecified: Secondary | ICD-10-CM | POA: Diagnosis not present

## 2021-02-15 DIAGNOSIS — R0602 Shortness of breath: Secondary | ICD-10-CM

## 2021-02-15 DIAGNOSIS — R0603 Acute respiratory distress: Secondary | ICD-10-CM

## 2021-02-15 DIAGNOSIS — T148XXA Other injury of unspecified body region, initial encounter: Secondary | ICD-10-CM

## 2021-02-15 LAB — I-STAT ARTERIAL BLOOD GAS, ED
Acid-Base Excess: 0 mmol/L (ref 0.0–2.0)
Bicarbonate: 26.5 mmol/L (ref 20.0–28.0)
Calcium, Ion: 1.15 mmol/L (ref 1.15–1.40)
HCT: 46 % (ref 39.0–52.0)
Hemoglobin: 15.6 g/dL (ref 13.0–17.0)
O2 Saturation: 100 %
Patient temperature: 98.6
Potassium: 3.8 mmol/L (ref 3.5–5.1)
Sodium: 139 mmol/L (ref 135–145)
TCO2: 28 mmol/L (ref 22–32)
pCO2 arterial: 50.8 mmHg — ABNORMAL HIGH (ref 32.0–48.0)
pH, Arterial: 7.325 — ABNORMAL LOW (ref 7.350–7.450)
pO2, Arterial: 573 mmHg — ABNORMAL HIGH (ref 83.0–108.0)

## 2021-02-15 LAB — I-STAT CHEM 8, ED
BUN: 16 mg/dL (ref 6–20)
Calcium, Ion: 1.06 mmol/L — ABNORMAL LOW (ref 1.15–1.40)
Chloride: 106 mmol/L (ref 98–111)
Creatinine, Ser: 1.2 mg/dL (ref 0.61–1.24)
Glucose, Bld: 133 mg/dL — ABNORMAL HIGH (ref 70–99)
HCT: 47 % (ref 39.0–52.0)
Hemoglobin: 16 g/dL (ref 13.0–17.0)
Potassium: 3.6 mmol/L (ref 3.5–5.1)
Sodium: 140 mmol/L (ref 135–145)
TCO2: 22 mmol/L (ref 22–32)

## 2021-02-15 LAB — COMPREHENSIVE METABOLIC PANEL
ALT: 29 U/L (ref 0–44)
AST: 40 U/L (ref 15–41)
Albumin: 4.2 g/dL (ref 3.5–5.0)
Alkaline Phosphatase: 96 U/L (ref 38–126)
Anion gap: 13 (ref 5–15)
BUN: 15 mg/dL (ref 6–20)
CO2: 21 mmol/L — ABNORMAL LOW (ref 22–32)
Calcium: 8.7 mg/dL — ABNORMAL LOW (ref 8.9–10.3)
Chloride: 104 mmol/L (ref 98–111)
Creatinine, Ser: 0.94 mg/dL (ref 0.61–1.24)
GFR, Estimated: >60 mL/min (ref >60)
Glucose, Bld: 132 mg/dL — ABNORMAL HIGH (ref 70–99)
Potassium: 3.5 mmol/L (ref 3.5–5.1)
Sodium: 138 mmol/L (ref 135–145)
Total Bilirubin: 0.3 mg/dL (ref 0.3–1.2)
Total Protein: 6.8 g/dL (ref 6.5–8.1)

## 2021-02-15 LAB — CBC
HCT: 46.4 % (ref 39.0–52.0)
Hemoglobin: 16 g/dL (ref 13.0–17.0)
MCH: 32.3 pg (ref 26.0–34.0)
MCHC: 34.5 g/dL (ref 30.0–36.0)
MCV: 93.5 fL (ref 80.0–100.0)
Platelets: 256 10*3/uL (ref 150–400)
RBC: 4.96 MIL/uL (ref 4.22–5.81)
RDW: 13.2 % (ref 11.5–15.5)
WBC: 16.8 10*3/uL — ABNORMAL HIGH (ref 4.0–10.5)
nRBC: 0 % (ref 0.0–0.2)

## 2021-02-15 LAB — PROTIME-INR
INR: 1 (ref 0.8–1.2)
Prothrombin Time: 13.4 seconds (ref 11.4–15.2)

## 2021-02-15 LAB — SAMPLE TO BLOOD BANK

## 2021-02-15 LAB — RESP PANEL BY RT-PCR (FLU A&B, COVID) ARPGX2
Influenza A by PCR: NEGATIVE
Influenza B by PCR: NEGATIVE
SARS Coronavirus 2 by RT PCR: NEGATIVE

## 2021-02-15 LAB — HIV ANTIBODY (ROUTINE TESTING W REFLEX): HIV Screen 4th Generation wRfx: NONREACTIVE

## 2021-02-15 LAB — ETHANOL: Alcohol, Ethyl (B): 166 mg/dL — ABNORMAL HIGH (ref <10)

## 2021-02-15 LAB — LACTIC ACID, PLASMA: Lactic Acid, Venous: 3.1 mmol/L (ref 0.5–1.9)

## 2021-02-15 MED ORDER — LIDOCAINE HCL (PF) 1 % IJ SOLN
INTRAMUSCULAR | Status: AC
  Filled 2021-02-15: qty 30

## 2021-02-15 MED ORDER — BACITRACIN ZINC 500 UNIT/GM EX OINT
TOPICAL_OINTMENT | Freq: Two times a day (BID) | CUTANEOUS | Status: DC
  Administered 2021-02-15 – 2021-03-26 (×39): 1 via TOPICAL
  Filled 2021-02-15 (×34): qty 0.9
  Filled 2021-02-15: qty 28.4
  Filled 2021-02-15 (×2): qty 0.9

## 2021-02-15 MED ORDER — LEVETIRACETAM IN NACL 500 MG/100ML IV SOLN
500.0000 mg | Freq: Two times a day (BID) | INTRAVENOUS | Status: DC
  Administered 2021-02-15 – 2021-02-21 (×12): 500 mg via INTRAVENOUS
  Filled 2021-02-15 (×13): qty 100

## 2021-02-15 MED ORDER — FENTANYL CITRATE (PF) 100 MCG/2ML IJ SOLN
50.0000 ug | Freq: Once | INTRAMUSCULAR | Status: DC
Start: 2021-02-15 — End: 2021-02-22

## 2021-02-15 MED ORDER — PROPOFOL 1000 MG/100ML IV EMUL
0.0000 ug/kg/min | INTRAVENOUS | Status: DC
  Administered 2021-02-15: 30 ug/kg/min via INTRAVENOUS

## 2021-02-15 MED ORDER — FENTANYL CITRATE (PF) 100 MCG/2ML IJ SOLN
100.0000 ug | INTRAMUSCULAR | Status: DC | PRN
Start: 2021-02-15 — End: 2021-02-15

## 2021-02-15 MED ORDER — MIDAZOLAM HCL 2 MG/2ML IJ SOLN
2.0000 mg | INTRAMUSCULAR | Status: DC | PRN
  Administered 2021-02-16 – 2021-02-27 (×9): 2 mg via INTRAVENOUS
  Filled 2021-02-15 (×5): qty 2

## 2021-02-15 MED ORDER — ORAL CARE MOUTH RINSE
15.0000 mL | OROMUCOSAL | Status: DC
  Administered 2021-02-15 – 2021-03-23 (×348): 15 mL via OROMUCOSAL

## 2021-02-15 MED ORDER — MIDAZOLAM HCL 2 MG/2ML IJ SOLN
INTRAMUSCULAR | Status: AC
  Administered 2021-02-17: 2 mg via INTRAVENOUS
  Filled 2021-02-15: qty 2

## 2021-02-15 MED ORDER — ETOMIDATE 2 MG/ML IV SOLN
INTRAVENOUS | Status: AC | PRN
Start: 2021-02-15 — End: 2021-02-15
  Administered 2021-02-15: 20 mg via INTRAVENOUS

## 2021-02-15 MED ORDER — MIDAZOLAM HCL 2 MG/2ML IJ SOLN
2.0000 mg | INTRAMUSCULAR | Status: AC | PRN
Start: 2021-02-15 — End: 2021-02-17
  Administered 2021-02-15 – 2021-02-16 (×2): 2 mg via INTRAVENOUS
  Filled 2021-02-15 (×3): qty 2

## 2021-02-15 MED ORDER — ONDANSETRON 4 MG PO TBDP: 4.0000 mg | ORAL_TABLET | Freq: Four times a day (QID) | ORAL | Status: DC | PRN

## 2021-02-15 MED ORDER — SUCCINYLCHOLINE CHLORIDE 20 MG/ML IJ SOLN
INTRAMUSCULAR | Status: AC | PRN
  Administered 2021-02-15: 120 mg via INTRAVENOUS

## 2021-02-15 MED ORDER — SODIUM CHLORIDE 0.9 % IV SOLN
INTRAVENOUS | Status: AC | PRN
  Administered 2021-02-15: 75 mL/h via INTRAVENOUS

## 2021-02-15 MED ORDER — FENTANYL BOLUS VIA INFUSION
50.0000 ug | INTRAVENOUS | Status: DC | PRN
  Administered 2021-02-21: 100 ug via INTRAVENOUS
  Administered 2021-02-21: 50 ug via INTRAVENOUS
  Administered 2021-02-21: 100 ug via INTRAVENOUS
  Administered 2021-02-21: 50 ug via INTRAVENOUS
  Administered 2021-02-21: 100 ug via INTRAVENOUS
  Administered 2021-02-22: 50 ug via INTRAVENOUS
  Filled 2021-02-15: qty 100

## 2021-02-15 MED ORDER — ACETAMINOPHEN 160 MG/5ML PO SOLN
650.0000 mg | Freq: Four times a day (QID) | ORAL | Status: DC
  Administered 2021-02-15 – 2021-02-16 (×2): 650 mg
  Filled 2021-02-15 (×2): qty 20.3

## 2021-02-15 MED ORDER — OXYCODONE HCL 5 MG/5ML PO SOLN: 5.0000 mg | ORAL | Status: DC | PRN

## 2021-02-15 MED ORDER — FENTANYL CITRATE (PF) 100 MCG/2ML IJ SOLN
100.0000 ug | INTRAMUSCULAR | Status: DC | PRN
  Administered 2021-02-15: 100 ug via INTRAVENOUS

## 2021-02-15 MED ORDER — SODIUM CHLORIDE 0.9 % IV SOLN: INTRAVENOUS | Status: DC

## 2021-02-15 MED ORDER — CHLORHEXIDINE GLUCONATE 0.12% ORAL RINSE (MEDLINE KIT)
15.0000 mL | Freq: Two times a day (BID) | OROMUCOSAL | Status: DC
Start: 2021-02-15 — End: 2021-03-23
  Administered 2021-02-15 – 2021-03-23 (×72): 15 mL via OROMUCOSAL

## 2021-02-15 MED ORDER — ONDANSETRON HCL 4 MG/2ML IJ SOLN
4.0000 mg | Freq: Four times a day (QID) | INTRAMUSCULAR | Status: DC | PRN
  Administered 2021-03-28: 4 mg via INTRAVENOUS
  Filled 2021-02-15: qty 2

## 2021-02-15 MED ORDER — IOHEXOL 300 MG/ML  SOLN
100.0000 mL | Freq: Once | INTRAMUSCULAR | Status: AC | PRN
  Administered 2021-02-15: 100 mL via INTRAVENOUS

## 2021-02-15 MED ORDER — TETANUS-DIPHTH-ACELL PERTUSSIS 5-2.5-18.5 LF-MCG/0.5 IM SUSY
0.5000 mL | PREFILLED_SYRINGE | Freq: Once | INTRAMUSCULAR | Status: AC
  Administered 2021-02-15: 0.5 mL via INTRAMUSCULAR
  Filled 2021-02-15: qty 0.5

## 2021-02-15 MED ORDER — PROPOFOL 1000 MG/100ML IV EMUL
0.0000 ug/kg/min | INTRAVENOUS | Status: DC
  Administered 2021-02-15: 45 ug/kg/min via INTRAVENOUS
  Administered 2021-02-15 – 2021-02-16 (×2): 40 ug/kg/min via INTRAVENOUS
  Administered 2021-02-16: 50 ug/kg/min via INTRAVENOUS
  Administered 2021-02-16: 40 ug/kg/min via INTRAVENOUS
  Administered 2021-02-16: 30 ug/kg/min via INTRAVENOUS
  Administered 2021-02-16: 40 ug/kg/min via INTRAVENOUS
  Administered 2021-02-17 – 2021-02-21 (×28): 50 ug/kg/min via INTRAVENOUS
  Filled 2021-02-15 (×2): qty 100
  Filled 2021-02-15: qty 200
  Filled 2021-02-15 (×19): qty 100
  Filled 2021-02-15: qty 300
  Filled 2021-02-15 (×3): qty 100
  Filled 2021-02-15: qty 300
  Filled 2021-02-15 (×3): qty 100

## 2021-02-15 MED ORDER — DOCUSATE SODIUM 50 MG/5ML PO LIQD
100.0000 mg | Freq: Two times a day (BID) | ORAL | Status: DC
  Administered 2021-02-16 – 2021-03-15 (×32): 100 mg
  Filled 2021-02-15 (×35): qty 10

## 2021-02-15 MED ORDER — CHLORHEXIDINE GLUCONATE CLOTH 2 % EX PADS
6.0000 | MEDICATED_PAD | Freq: Every day | CUTANEOUS | Status: DC
  Administered 2021-02-16 – 2021-03-01 (×14): 6 via TOPICAL

## 2021-02-15 MED ORDER — HYDRALAZINE HCL 20 MG/ML IJ SOLN
10.0000 mg | INTRAMUSCULAR | Status: AC | PRN
  Administered 2021-02-20 – 2021-02-21 (×4): 10 mg via INTRAVENOUS
  Filled 2021-02-15 (×4): qty 1

## 2021-02-15 MED ORDER — POLYETHYLENE GLYCOL 3350 17 G PO PACK
17.0000 g | PACK | Freq: Every day | ORAL | Status: DC
  Administered 2021-02-16 – 2021-03-14 (×13): 17 g
  Filled 2021-02-15 (×16): qty 1

## 2021-02-15 MED ORDER — METOPROLOL TARTRATE 5 MG/5ML IV SOLN
5.0000 mg | Freq: Four times a day (QID) | INTRAVENOUS | Status: DC | PRN
Start: 2021-02-15 — End: 2021-02-21
  Administered 2021-02-20 – 2021-02-21 (×3): 5 mg via INTRAVENOUS
  Filled 2021-02-15 (×3): qty 5

## 2021-02-15 MED ORDER — FENTANYL 2500MCG IN NS 250ML (10MCG/ML) PREMIX INFUSION
0.0000 ug/h | INTRAVENOUS | Status: DC
  Administered 2021-02-15: 100 ug/h via INTRAVENOUS
  Administered 2021-02-16: 200 ug/h via INTRAVENOUS
  Administered 2021-02-17: 250 ug/h via INTRAVENOUS
  Administered 2021-02-17: 150 ug/h via INTRAVENOUS
  Administered 2021-02-18: 200 ug/h via INTRAVENOUS
  Administered 2021-02-18: 250 ug/h via INTRAVENOUS
  Administered 2021-02-19: 200 ug/h via INTRAVENOUS
  Administered 2021-02-19: 50 ug/h via INTRAVENOUS
  Administered 2021-02-20 (×3): 400 ug/h via INTRAVENOUS
  Administered 2021-02-20: 350 ug/h via INTRAVENOUS
  Administered 2021-02-21: 40 ug/h via INTRAVENOUS
  Administered 2021-02-21: 400 ug/h via INTRAVENOUS
  Administered 2021-02-21: 350 ug/h via INTRAVENOUS
  Filled 2021-02-15 (×16): qty 250

## 2021-02-15 NOTE — Progress Notes (Signed)
Transition of Care [facility]) - CAGE-AID Screening   Patient Details  Name: Scott [name] MRN: [id number] Date of Birth: [birth year]  Transition of Care [facility]): Etheleen Sia, RN, TRN Phone Number: 02/15/2021, 10:12 PM   CAGE-AID Screening: Substance Abuse Screening unable to be completed due to: : Patient unable to participate (Ventilated and Sedated)
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
## 2021-02-15 NOTE — ED Notes (Signed)
ENT at bedside

## 2021-02-15 NOTE — Progress Notes (Signed)
Orthopedic Tech Progress Note Patient Details:  [name] 03-Dec-1985 [id number] Level 1 Trauma Patient ID: Scott [name], male   DOB: 1985-12-18, 35 y.o.   MRN: [id number]  [name] 02/15/2021, 7:41 PM
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
## 2021-02-15 NOTE — ED Notes (Signed)
Father Harlene Ramus [phone] would like an update
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
## 2021-02-16 ENCOUNTER — Inpatient Hospital Stay (HOSPITAL_COMMUNITY): Payer: 59

## 2021-02-16 LAB — URINALYSIS, ROUTINE W REFLEX MICROSCOPIC
Bilirubin Urine: NEGATIVE
Glucose, UA: NEGATIVE mg/dL
Hgb urine dipstick: NEGATIVE
Ketones, ur: 5 mg/dL — AB
Leukocytes,Ua: NEGATIVE
Nitrite: NEGATIVE
Protein, ur: NEGATIVE mg/dL
Specific Gravity, Urine: >1.046 — ABNORMAL HIGH (ref 1.005–1.030)
pH: 5 (ref 5.0–8.0)

## 2021-02-16 LAB — COMPREHENSIVE METABOLIC PANEL
ALT: 29 U/L (ref 0–44)
AST: 43 U/L — ABNORMAL HIGH (ref 15–41)
Albumin: 3.1 g/dL — ABNORMAL LOW (ref 3.5–5.0)
Alkaline Phosphatase: 61 U/L (ref 38–126)
Anion gap: 6 (ref 5–15)
BUN: 11 mg/dL (ref 6–20)
CO2: 22 mmol/L (ref 22–32)
Calcium: 7.6 mg/dL — ABNORMAL LOW (ref 8.9–10.3)
Chloride: 109 mmol/L (ref 98–111)
Creatinine, Ser: 0.75 mg/dL (ref 0.61–1.24)
GFR, Estimated: >60 mL/min (ref >60)
Glucose, Bld: 92 mg/dL (ref 70–99)
Potassium: 3.6 mmol/L (ref 3.5–5.1)
Sodium: 137 mmol/L (ref 135–145)
Total Bilirubin: 0.9 mg/dL (ref 0.3–1.2)
Total Protein: 5.2 g/dL — ABNORMAL LOW (ref 6.5–8.1)

## 2021-02-16 LAB — BASIC METABOLIC PANEL
Anion gap: 11 (ref 5–15)
BUN: 11 mg/dL (ref 6–20)
CO2: 23 mmol/L (ref 22–32)
Calcium: 8.5 mg/dL — ABNORMAL LOW (ref 8.9–10.3)
Chloride: 102 mmol/L (ref 98–111)
Creatinine, Ser: 0.71 mg/dL (ref 0.61–1.24)
GFR, Estimated: >60 mL/min (ref >60)
Glucose, Bld: 115 mg/dL — ABNORMAL HIGH (ref 70–99)
Potassium: 3.5 mmol/L (ref 3.5–5.1)
Sodium: 136 mmol/L (ref 135–145)

## 2021-02-16 LAB — TRIGLYCERIDES: Triglycerides: 211 mg/dL — ABNORMAL HIGH (ref <150)

## 2021-02-16 LAB — CBC
HCT: 42.3 % (ref 39.0–52.0)
Hemoglobin: 14.5 g/dL (ref 13.0–17.0)
MCH: 31.3 pg (ref 26.0–34.0)
MCHC: 34.3 g/dL (ref 30.0–36.0)
MCV: 91.4 fL (ref 80.0–100.0)
Platelets: 215 10*3/uL (ref 150–400)
RBC: 4.63 MIL/uL (ref 4.22–5.81)
RDW: 13.4 % (ref 11.5–15.5)
WBC: 12.6 10*3/uL — ABNORMAL HIGH (ref 4.0–10.5)
nRBC: 0 % (ref 0.0–0.2)

## 2021-02-16 LAB — RAPID URINE DRUG SCREEN, HOSP PERFORMED
Amphetamines: NOT DETECTED
Barbiturates: NOT DETECTED
Benzodiazepines: POSITIVE — AB
Cocaine: NOT DETECTED
Opiates: NOT DETECTED
Tetrahydrocannabinol: POSITIVE — AB

## 2021-02-16 LAB — MRSA NEXT GEN BY PCR, NASAL: MRSA by PCR Next Gen: NOT DETECTED

## 2021-02-16 LAB — GLUCOSE, CAPILLARY
Glucose-Capillary: 118 mg/dL — ABNORMAL HIGH (ref 70–99)
Glucose-Capillary: 134 mg/dL — ABNORMAL HIGH (ref 70–99)
Glucose-Capillary: 125 mg/dL — ABNORMAL HIGH (ref 70–99)

## 2021-02-16 LAB — PHOSPHORUS: Phosphorus: 3.1 mg/dL (ref 2.5–4.6)

## 2021-02-16 LAB — MAGNESIUM: Magnesium: 1.9 mg/dL (ref 1.7–2.4)

## 2021-02-16 MED ORDER — FOLIC ACID 1 MG PO TABS
1.0000 mg | ORAL_TABLET | Freq: Every day | ORAL | Status: DC
  Administered 2021-02-16 – 2021-04-01 (×45): 1 mg
  Filled 2021-02-16 (×45): qty 1

## 2021-02-16 MED ORDER — PROSOURCE TF PO LIQD
45.0000 mL | Freq: Two times a day (BID) | ORAL | Status: DC
  Administered 2021-02-16: 45 mL
  Filled 2021-02-16: qty 45

## 2021-02-16 MED ORDER — ADULT MULTIVITAMIN W/MINERALS CH
1.0000 | ORAL_TABLET | Freq: Every day | ORAL | Status: DC
  Administered 2021-02-16 – 2021-04-01 (×45): 1
  Filled 2021-02-16 (×45): qty 1

## 2021-02-16 MED ORDER — OXYCODONE HCL 5 MG/5ML PO SOLN
5.0000 mg | ORAL | Status: DC | PRN
  Administered 2021-02-16: 10 mg
  Administered 2021-02-16: 5 mg
  Administered 2021-02-16 – 2021-03-13 (×19): 10 mg
  Administered 2021-03-16: 5 mg
  Administered 2021-03-16 – 2021-03-22 (×3): 10 mg
  Filled 2021-02-16 (×8): qty 10
  Filled 2021-02-16: qty 5
  Filled 2021-02-16 (×17): qty 10

## 2021-02-16 MED ORDER — LORAZEPAM 2 MG/ML IJ SOLN: 1.0000 mg | INTRAMUSCULAR | Status: AC | PRN

## 2021-02-16 MED ORDER — ACETAMINOPHEN 160 MG/5ML PO SOLN
1000.0000 mg | Freq: Four times a day (QID) | ORAL | Status: DC
  Administered 2021-02-16 – 2021-03-15 (×100): 1000 mg
  Filled 2021-02-16 (×103): qty 40.6

## 2021-02-16 MED ORDER — THIAMINE HCL 100 MG PO TABS
100.0000 mg | ORAL_TABLET | Freq: Every day | ORAL | Status: DC
  Administered 2021-02-16 – 2021-04-01 (×37): 100 mg
  Filled 2021-02-16 (×37): qty 1

## 2021-02-16 MED ORDER — METHOCARBAMOL 500 MG PO TABS
1000.0000 mg | ORAL_TABLET | Freq: Three times a day (TID) | ORAL | Status: DC
Start: 2021-02-16 — End: 2021-03-26
  Administered 2021-02-16 – 2021-03-26 (×111): 1000 mg
  Filled 2021-02-16 (×109): qty 2

## 2021-02-16 MED ORDER — PIVOT 1.5 CAL PO LIQD
1000.0000 mL | ORAL | Status: DC
  Administered 2021-02-16 – 2021-02-21 (×5): 1000 mL
  Filled 2021-02-16 (×2): qty 1000

## 2021-02-16 MED ORDER — PANTOPRAZOLE SODIUM 40 MG PO PACK
40.0000 mg | PACK | Freq: Every day | ORAL | Status: DC
  Administered 2021-02-17 – 2021-02-21 (×5): 40 mg
  Filled 2021-02-16 (×5): qty 20

## 2021-02-16 MED ORDER — LORAZEPAM 1 MG PO TABS
1.0000 mg | ORAL_TABLET | ORAL | Status: AC | PRN
  Administered 2021-02-17: 2 mg
  Filled 2021-02-16: qty 2

## 2021-02-16 MED ORDER — BETHANECHOL CHLORIDE 10 MG PO TABS
10.0000 mg | ORAL_TABLET | Freq: Three times a day (TID) | ORAL | Status: DC
  Administered 2021-02-16 – 2021-02-27 (×34): 10 mg
  Filled 2021-02-16 (×35): qty 1

## 2021-02-16 MED ORDER — VITAL HIGH PROTEIN PO LIQD
1000.0000 mL | ORAL | Status: AC
  Administered 2021-02-16: 1000 mL

## 2021-02-16 MED ORDER — IOHEXOL 350 MG/ML SOLN
75.0000 mL | Freq: Once | INTRAVENOUS | Status: AC | PRN
  Administered 2021-02-16: 75 mL via INTRAVENOUS

## 2021-02-16 MED ORDER — PANTOPRAZOLE SODIUM 40 MG IV SOLR
40.0000 mg | INTRAVENOUS | Status: DC
  Administered 2021-02-16: 40 mg via INTRAVENOUS
  Filled 2021-02-16: qty 40

## 2021-02-16 MED ORDER — THIAMINE HCL 100 MG/ML IJ SOLN
100.0000 mg | Freq: Every day | INTRAMUSCULAR | Status: DC
  Administered 2021-02-23 – 2021-03-31 (×8): 100 mg via INTRAVENOUS
  Filled 2021-02-16 (×11): qty 2

## 2021-02-16 MED ORDER — QUETIAPINE FUMARATE 25 MG PO TABS
50.0000 mg | ORAL_TABLET | Freq: Two times a day (BID) | ORAL | Status: DC
  Administered 2021-02-16 (×2): 50 mg
  Filled 2021-02-16 (×2): qty 2

## 2021-02-16 NOTE — Progress Notes (Signed)
Pt placed on PSV 10/5 by MD. Pt is tolerating well at this time. RT to continue to monitor. 
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
## 2021-02-16 NOTE — Procedures (Signed)
Cortrak  Person Inserting Tube:  Kendell Bane C, RD Tube Type:  Cortrak - 43 inches Tube Size:  10 Tube Location:  Left nare Initial Placement:  Stomach Secured by: Bridle Technique Used to Measure Tube Placement:  Marking at nare/corner of mouth Cortrak Secured At:  80 cm  Cortrak Tube Team Note:  Consult received to place a Cortrak feeding tube.   X-ray is required, abdominal x-ray has been ordered by the Cortrak team. Please confirm tube placement before using the Cortrak tube.   If the tube becomes dislodged please keep the tube and contact the Cortrak team at [email or web address] (password [email or web address]) for replacement.  If after hours and replacement cannot be delayed, place a NG tube and confirm placement with an abdominal x-ray.    Cammy Copa., RD, LDN, CNSC See AMiON for contact information
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
## 2021-02-16 NOTE — Progress Notes (Signed)
Pt placed back on full vent support due to RR at 6-7 times per minute. RN at bedside.

## 2021-02-16 NOTE — Progress Notes (Signed)
Patient transported to CT scan/MRI on full vent support.

## 2021-02-16 NOTE — Progress Notes (Addendum)
Clinical update provided to patient's wife at bedside. All questions answered. Wife reports patient stopped smoking 1y ago. C-collar removed per NSGY recs. Tolerated PSV x3.5h this AM. Foley placed for urinary retention. Urecholine started. Per ortho, clavicle is operative. Cleared for OR with Ortho.   Additional critical care time:  Diamantina Monks, MD General and Trauma Surgery [facility]
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
## 2021-02-17 ENCOUNTER — Other Ambulatory Visit: Payer: Self-pay | Admitting: Otolaryngology

## 2021-02-17 LAB — BASIC METABOLIC PANEL
Anion gap: 5 (ref 5–15)
BUN: 9 mg/dL (ref 6–20)
CO2: 25 mmol/L (ref 22–32)
Calcium: 7.8 mg/dL — ABNORMAL LOW (ref 8.9–10.3)
Chloride: 108 mmol/L (ref 98–111)
Creatinine, Ser: 0.66 mg/dL (ref 0.61–1.24)
GFR, Estimated: >60 mL/min (ref >60)
Glucose, Bld: 114 mg/dL — ABNORMAL HIGH (ref 70–99)
Potassium: 3.8 mmol/L (ref 3.5–5.1)
Sodium: 138 mmol/L (ref 135–145)

## 2021-02-17 LAB — GLUCOSE, CAPILLARY
Glucose-Capillary: 169 mg/dL — ABNORMAL HIGH (ref 70–99)
Glucose-Capillary: 118 mg/dL — ABNORMAL HIGH (ref 70–99)
Glucose-Capillary: 129 mg/dL — ABNORMAL HIGH (ref 70–99)
Glucose-Capillary: 115 mg/dL — ABNORMAL HIGH (ref 70–99)
Glucose-Capillary: 147 mg/dL — ABNORMAL HIGH (ref 70–99)

## 2021-02-17 LAB — CBC
HCT: 34.9 % — ABNORMAL LOW (ref 39.0–52.0)
Hemoglobin: 12 g/dL — ABNORMAL LOW (ref 13.0–17.0)
MCH: 32.3 pg (ref 26.0–34.0)
MCHC: 34.4 g/dL (ref 30.0–36.0)
MCV: 93.8 fL (ref 80.0–100.0)
Platelets: 169 10*3/uL (ref 150–400)
RBC: 3.72 MIL/uL — ABNORMAL LOW (ref 4.22–5.81)
RDW: 13.6 % (ref 11.5–15.5)
WBC: 8.7 10*3/uL (ref 4.0–10.5)
nRBC: 0 % (ref 0.0–0.2)

## 2021-02-17 MED ORDER — CLONAZEPAM 1 MG PO TABS
1.0000 mg | ORAL_TABLET | Freq: Two times a day (BID) | ORAL | Status: DC
  Administered 2021-02-17 – 2021-02-21 (×10): 1 mg
  Filled 2021-02-17 (×5): qty 1
  Filled 2021-02-17: qty 2
  Filled 2021-02-17 (×4): qty 1

## 2021-02-17 MED ORDER — QUETIAPINE FUMARATE 100 MG PO TABS
100.0000 mg | ORAL_TABLET | Freq: Two times a day (BID) | ORAL | Status: DC
  Administered 2021-02-17 – 2021-02-21 (×10): 100 mg
  Filled 2021-02-17 (×10): qty 1

## 2021-02-17 NOTE — Progress Notes (Signed)
RR 6 breath/min, SaO2 99%. Fentanyl decreased, RT notified. Switched vent to SIMV 20, TV 600, Peep 5, FiO2 40% per RT's request.
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
## 2021-02-18 LAB — CBC
HCT: 29.8 % — ABNORMAL LOW (ref 39.0–52.0)
Hemoglobin: 10.3 g/dL — ABNORMAL LOW (ref 13.0–17.0)
MCH: 32.5 pg (ref 26.0–34.0)
MCHC: 34.6 g/dL (ref 30.0–36.0)
MCV: 94 fL (ref 80.0–100.0)
Platelets: 144 10*3/uL — ABNORMAL LOW (ref 150–400)
RBC: 3.17 MIL/uL — ABNORMAL LOW (ref 4.22–5.81)
RDW: 13.8 % (ref 11.5–15.5)
WBC: 10.4 10*3/uL (ref 4.0–10.5)
nRBC: 0 % (ref 0.0–0.2)

## 2021-02-18 LAB — BASIC METABOLIC PANEL
Anion gap: 3 — ABNORMAL LOW (ref 5–15)
BUN: 8 mg/dL (ref 6–20)
CO2: 24 mmol/L (ref 22–32)
Calcium: 7.9 mg/dL — ABNORMAL LOW (ref 8.9–10.3)
Chloride: 112 mmol/L — ABNORMAL HIGH (ref 98–111)
Creatinine, Ser: 0.65 mg/dL (ref 0.61–1.24)
GFR, Estimated: >60 mL/min (ref >60)
Glucose, Bld: 120 mg/dL — ABNORMAL HIGH (ref 70–99)
Potassium: 3.7 mmol/L (ref 3.5–5.1)
Sodium: 139 mmol/L (ref 135–145)

## 2021-02-18 LAB — MAGNESIUM: Magnesium: 2 mg/dL (ref 1.7–2.4)

## 2021-02-18 LAB — PHOSPHORUS: Phosphorus: 1.4 mg/dL — ABNORMAL LOW (ref 2.5–4.6)

## 2021-02-18 LAB — GLUCOSE, CAPILLARY
Glucose-Capillary: 100 mg/dL — ABNORMAL HIGH (ref 70–99)
Glucose-Capillary: 116 mg/dL — ABNORMAL HIGH (ref 70–99)
Glucose-Capillary: 122 mg/dL — ABNORMAL HIGH (ref 70–99)
Glucose-Capillary: 128 mg/dL — ABNORMAL HIGH (ref 70–99)
Glucose-Capillary: 130 mg/dL — ABNORMAL HIGH (ref 70–99)
Glucose-Capillary: 169 mg/dL — ABNORMAL HIGH (ref 70–99)

## 2021-02-18 MED ORDER — BUPIVACAINE-EPINEPHRINE (PF) 0.25% -1:200000 IJ SOLN
INTRAMUSCULAR | Status: AC
  Filled 2021-02-18: qty 30

## 2021-02-18 MED ORDER — POTASSIUM PHOSPHATES 15 MMOLE/5ML IV SOLN
45.0000 mmol | Freq: Once | INTRAVENOUS | Status: AC
  Administered 2021-02-18: 45 mmol via INTRAVENOUS
  Filled 2021-02-18: qty 15
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
## 2021-02-19 ENCOUNTER — Inpatient Hospital Stay (HOSPITAL_COMMUNITY): Payer: 59

## 2021-02-19 LAB — GLUCOSE, CAPILLARY
Glucose-Capillary: 133 mg/dL — ABNORMAL HIGH (ref 70–99)
Glucose-Capillary: 118 mg/dL — ABNORMAL HIGH (ref 70–99)
Glucose-Capillary: 157 mg/dL — ABNORMAL HIGH (ref 70–99)
Glucose-Capillary: 122 mg/dL — ABNORMAL HIGH (ref 70–99)
Glucose-Capillary: 123 mg/dL — ABNORMAL HIGH (ref 70–99)

## 2021-02-19 LAB — CULTURE, RESPIRATORY W GRAM STAIN: Culture: NORMAL

## 2021-02-19 LAB — CBC
HCT: 33.6 % — ABNORMAL LOW (ref 39.0–52.0)
Hemoglobin: 11.2 g/dL — ABNORMAL LOW (ref 13.0–17.0)
MCH: 31.7 pg (ref 26.0–34.0)
MCHC: 33.3 g/dL (ref 30.0–36.0)
MCV: 95.2 fL (ref 80.0–100.0)
Platelets: 150 10*3/uL (ref 150–400)
RBC: 3.53 MIL/uL — ABNORMAL LOW (ref 4.22–5.81)
RDW: 14.6 % (ref 11.5–15.5)
WBC: 9.6 10*3/uL (ref 4.0–10.5)
nRBC: 0 % (ref 0.0–0.2)

## 2021-02-19 LAB — BASIC METABOLIC PANEL
Anion gap: 7 (ref 5–15)
BUN: 9 mg/dL (ref 6–20)
CO2: 24 mmol/L (ref 22–32)
Calcium: 8.1 mg/dL — ABNORMAL LOW (ref 8.9–10.3)
Chloride: 109 mmol/L (ref 98–111)
Creatinine, Ser: 0.56 mg/dL — ABNORMAL LOW (ref 0.61–1.24)
GFR, Estimated: >60 mL/min (ref >60)
Glucose, Bld: 144 mg/dL — ABNORMAL HIGH (ref 70–99)
Potassium: 3.8 mmol/L (ref 3.5–5.1)
Sodium: 140 mmol/L (ref 135–145)

## 2021-02-19 LAB — TRIGLYCERIDES: Triglycerides: 81 mg/dL (ref <150)

## 2021-02-19 LAB — MAGNESIUM: Magnesium: 1.9 mg/dL (ref 1.7–2.4)

## 2021-02-19 LAB — PHOSPHORUS: Phosphorus: 2.7 mg/dL (ref 2.5–4.6)

## 2021-02-19 MED ORDER — MIDAZOLAM HCL 2 MG/2ML IJ SOLN
4.0000 mg | Freq: Once | INTRAMUSCULAR | Status: AC
  Administered 2021-02-20: 4 mg via INTRAVENOUS
  Filled 2021-02-19: qty 4

## 2021-02-19 MED ORDER — HALOPERIDOL LACTATE 5 MG/ML IJ SOLN
INTRAMUSCULAR | Status: AC
  Administered 2021-02-19: 10 mg
  Filled 2021-02-19: qty 1

## 2021-02-19 MED ORDER — HALOPERIDOL 5 MG PO TABS
5.0000 mg | ORAL_TABLET | Freq: Once | ORAL | Status: DC
Start: 2021-02-20 — End: 2021-02-22
  Filled 2021-02-19: qty 1

## 2021-02-19 MED ORDER — HALOPERIDOL LACTATE 5 MG/ML IJ SOLN: 10.0000 mg | Freq: Once | INTRAMUSCULAR | Status: DC

## 2021-02-19 MED ORDER — HALOPERIDOL 5 MG PO TABS
5.0000 mg | ORAL_TABLET | Freq: Once | ORAL | Status: DC
Start: 2021-02-20 — End: 2021-02-19

## 2021-02-19 MED ORDER — HALOPERIDOL LACTATE 5 MG/ML IJ SOLN
5.0000 mg | Freq: Once | INTRAMUSCULAR | Status: DC
Start: 2021-02-20 — End: 2021-02-19

## 2021-02-19 MED ORDER — HALOPERIDOL LACTATE 5 MG/ML IJ SOLN
INTRAMUSCULAR | Status: AC
  Administered 2021-02-19: 5 mg
  Filled 2021-02-19: qty 1

## 2021-02-19 NOTE — Progress Notes (Signed)
No new problems or events overnight.  Patient still requires sedation for safety.  When patient's sedation is lifted the patient is combative and difficult to control.  The patient shows no signs of higher brain function or awakening.  He will not follow commands.  Follow-up head CT scan demonstrates continued presence of significant cerebral contusions and associated edema particular in his right temporal lobe.  No new areas of significant hemorrhage or mass-effect.  Severe multifocal traumatic brain injury.  Continue supportive care.  No indications to image further.
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
## 2021-02-20 ENCOUNTER — Inpatient Hospital Stay (HOSPITAL_COMMUNITY): Payer: 59

## 2021-02-20 ENCOUNTER — Inpatient Hospital Stay: Payer: Self-pay

## 2021-02-20 LAB — BASIC METABOLIC PANEL
Anion gap: 7 (ref 5–15)
BUN: 9 mg/dL (ref 6–20)
CO2: 24 mmol/L (ref 22–32)
Calcium: 8 mg/dL — ABNORMAL LOW (ref 8.9–10.3)
Chloride: 108 mmol/L (ref 98–111)
Creatinine, Ser: 0.49 mg/dL — ABNORMAL LOW (ref 0.61–1.24)
GFR, Estimated: >60 mL/min (ref >60)
Glucose, Bld: 177 mg/dL — ABNORMAL HIGH (ref 70–99)
Potassium: 3.9 mmol/L (ref 3.5–5.1)
Sodium: 139 mmol/L (ref 135–145)

## 2021-02-20 LAB — CBC
HCT: 32.5 % — ABNORMAL LOW (ref 39.0–52.0)
Hemoglobin: 11 g/dL — ABNORMAL LOW (ref 13.0–17.0)
MCH: 32.3 pg (ref 26.0–34.0)
MCHC: 33.8 g/dL (ref 30.0–36.0)
MCV: 95.3 fL (ref 80.0–100.0)
Platelets: 191 10*3/uL (ref 150–400)
RBC: 3.41 MIL/uL — ABNORMAL LOW (ref 4.22–5.81)
RDW: 14.6 % (ref 11.5–15.5)
WBC: 11.2 10*3/uL — ABNORMAL HIGH (ref 4.0–10.5)
nRBC: 0 % (ref 0.0–0.2)

## 2021-02-20 LAB — GLUCOSE, CAPILLARY
Glucose-Capillary: 120 mg/dL — ABNORMAL HIGH (ref 70–99)
Glucose-Capillary: 140 mg/dL — ABNORMAL HIGH (ref 70–99)
Glucose-Capillary: 147 mg/dL — ABNORMAL HIGH (ref 70–99)
Glucose-Capillary: 159 mg/dL — ABNORMAL HIGH (ref 70–99)
Glucose-Capillary: 162 mg/dL — ABNORMAL HIGH (ref 70–99)
Glucose-Capillary: 169 mg/dL — ABNORMAL HIGH (ref 70–99)

## 2021-02-20 LAB — MAGNESIUM: Magnesium: 1.9 mg/dL (ref 1.7–2.4)

## 2021-02-20 LAB — PHOSPHORUS: Phosphorus: 2.1 mg/dL — ABNORMAL LOW (ref 2.5–4.6)

## 2021-02-20 MED ORDER — MIDAZOLAM-SODIUM CHLORIDE 100-0.9 MG/100ML-% IV SOLN
0.5000 mg/h | INTRAVENOUS | Status: DC
  Administered 2021-02-20: 0.5 mg/h via INTRAVENOUS
  Administered 2021-02-21: 2 mg/h via INTRAVENOUS
  Administered 2021-02-21: 10 mg/h via INTRAVENOUS
  Administered 2021-02-21: 5 mg/h via INTRAVENOUS
  Administered 2021-02-22 – 2021-02-23 (×5): 10 mg/h via INTRAVENOUS
  Administered 2021-02-24: 9 mg/h via INTRAVENOUS
  Administered 2021-02-24: 10 mg/h via INTRAVENOUS
  Administered 2021-02-25: 8 mg/h via INTRAVENOUS
  Filled 2021-02-20 (×12): qty 100

## 2021-02-20 MED ORDER — SODIUM CHLORIDE 0.9% FLUSH
10.0000 mL | Freq: Two times a day (BID) | INTRAVENOUS | Status: DC
  Administered 2021-02-20 – 2021-03-07 (×27): 10 mL
  Administered 2021-03-09: 20 mL

## 2021-02-20 MED ORDER — DEXMEDETOMIDINE HCL IN NACL 400 MCG/100ML IV SOLN
0.4000 ug/kg/h | INTRAVENOUS | Status: DC
  Administered 2021-02-20: 0.8 ug/kg/h via INTRAVENOUS
  Administered 2021-02-20: 0.4 ug/kg/h via INTRAVENOUS
  Administered 2021-02-20: 0.8 ug/kg/h via INTRAVENOUS
  Administered 2021-02-21: 1 ug/kg/h via INTRAVENOUS
  Administered 2021-02-21: 0.8 ug/kg/h via INTRAVENOUS
  Administered 2021-02-21 – 2021-02-23 (×9): 1 ug/kg/h via INTRAVENOUS
  Filled 2021-02-20 (×8): qty 100
  Filled 2021-02-20: qty 200
  Filled 2021-02-20 (×4): qty 100

## 2021-02-20 MED ORDER — VECURONIUM BROMIDE 10 MG IV SOLR
0.1000 mg/kg | INTRAVENOUS | Status: DC | PRN
  Administered 2021-02-20 – 2021-02-21 (×3): 8.4 mg via INTRAVENOUS
  Filled 2021-02-20 (×3): qty 10

## 2021-02-20 MED ORDER — SODIUM CHLORIDE 0.9% FLUSH: 10.0000 mL | INTRAVENOUS | Status: DC | PRN
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
## 2021-02-20 NOTE — Progress Notes (Signed)
Peripherally Inserted Central Catheter Placement  The IV Nurse has discussed with the patient and/or persons authorized to consent for the patient, the purpose of this procedure and the potential benefits and risks involved with this procedure.  The benefits include less needle sticks, lab draws from the catheter, and the patient may be discharged home with the catheter. Risks include, but not limited to, infection, bleeding, blood clot (thrombus formation), and puncture of an artery; nerve damage and irregular heartbeat and possibility to perform a PICC exchange if needed/ordered by physician.  Alternatives to this procedure were also discussed.  Bard Power PICC patient education guide, fact sheet on infection prevention and patient information card has been provided to patient /or left at bedside.  POA, girlfriend "wife", signed consent.  Father also at bedside agreeable to PICC placement.  PICC Placement Documentation  PICC Triple Lumen 02/20/21 PICC Right Basilic 39 cm 0 cm (Active)  Indication for Insertion or Continuance of Line Prolonged intravenous therapies 02/20/21 1446  Exposed Catheter (cm) 0 cm 02/20/21 1446  Site Assessment Clean;Dry;Intact 02/20/21 1446  Lumen #1 Status Flushed;Saline locked;Blood return noted 02/20/21 1446  Lumen #2 Status Flushed;Saline locked;Blood return noted 02/20/21 1446  Lumen #3 Status Flushed;Saline locked;Blood return noted 02/20/21 1446  Dressing Type Transparent 02/20/21 1446  Dressing Status Clean;Dry;Intact 02/20/21 1446  Antimicrobial disc in place? Yes 02/20/21 1446  Safety Lock Not Applicable 02/20/21 1446  Line Care Connections checked and tightened 02/20/21 1446  Line Adjustment (NICU/IV Team Only) No 02/20/21 1446  Dressing Intervention New dressing 02/20/21 1446  Dressing Change Due 02/27/21 02/20/21 1446       Elliot Dally 02/20/2021, 2:47 PM
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
## 2021-02-20 NOTE — Progress Notes (Signed)
Patient very agitated/restless, thrashing about, and raising up in the bed. Upon further assessment, the patient's IV access had been lost and the patient was no longer getting sedation medication.   Multiple IV access attempts were attempted by other ICU staff, and IV team was paged STAT, as well as TRN, Phineas Semen. TRN paged Dr. Fredricka Bonine for order for foot or IO IV access. During time without IV access and patient not receiving sedation medication, the patient's O2 saturation dropped to the low 70s. Respiratory was paged, and the patient was bagged. Patient continued to be very agitated/restless and 10mg  of Haldol was ordered and given IM.  IV access established by IV team and sedation medication resumed. Respiratory status improved, and O2 sats stable. Patient continued to be agitated while sedation was maxed out with propofol @50  and fentanyl @400 . Dr. paged by , TRN and orders for versed gtt and PRN paralytic were placed by Dr. .   Versed gtt started and PRN paralytic given. RN will continue to monitor.    Fredricka Bonine, RN

## 2021-02-21 ENCOUNTER — Inpatient Hospital Stay (HOSPITAL_COMMUNITY): Payer: 59

## 2021-02-21 LAB — CBC
HCT: 29.6 % — ABNORMAL LOW (ref 39.0–52.0)
Hemoglobin: 9.6 g/dL — ABNORMAL LOW (ref 13.0–17.0)
MCH: 31.8 pg (ref 26.0–34.0)
MCHC: 32.4 g/dL (ref 30.0–36.0)
MCV: 98 fL (ref 80.0–100.0)
Platelets: 208 10*3/uL (ref 150–400)
RBC: 3.02 MIL/uL — ABNORMAL LOW (ref 4.22–5.81)
RDW: 15 % (ref 11.5–15.5)
WBC: 12.3 10*3/uL — ABNORMAL HIGH (ref 4.0–10.5)
nRBC: 0 % (ref 0.0–0.2)

## 2021-02-21 LAB — POCT I-STAT 7, (LYTES, BLD GAS, ICA,H+H)
Acid-Base Excess: 0 mmol/L (ref 0.0–2.0)
Acid-Base Excess: 0 mmol/L (ref 0.0–2.0)
Acid-Base Excess: 1 mmol/L (ref 0.0–2.0)
Acid-Base Excess: 1 mmol/L (ref 0.0–2.0)
Bicarbonate: 24.5 mmol/L (ref 20.0–28.0)
Bicarbonate: 25.3 mmol/L (ref 20.0–28.0)
Bicarbonate: 27.2 mmol/L (ref 20.0–28.0)
Bicarbonate: 26.8 mmol/L (ref 20.0–28.0)
Calcium, Ion: 1.23 mmol/L (ref 1.15–1.40)
Calcium, Ion: 1.22 mmol/L (ref 1.15–1.40)
Calcium, Ion: 1.23 mmol/L (ref 1.15–1.40)
Calcium, Ion: 1.23 mmol/L (ref 1.15–1.40)
HCT: 29 % — ABNORMAL LOW (ref 39.0–52.0)
HCT: 29 % — ABNORMAL LOW (ref 39.0–52.0)
HCT: 27 % — ABNORMAL LOW (ref 39.0–52.0)
HCT: 27 % — ABNORMAL LOW (ref 39.0–52.0)
Hemoglobin: 9.9 g/dL — ABNORMAL LOW (ref 13.0–17.0)
Hemoglobin: 9.9 g/dL — ABNORMAL LOW (ref 13.0–17.0)
Hemoglobin: 9.2 g/dL — ABNORMAL LOW (ref 13.0–17.0)
Hemoglobin: 9.2 g/dL — ABNORMAL LOW (ref 13.0–17.0)
O2 Saturation: 91 %
O2 Saturation: 93 %
O2 Saturation: 96 %
O2 Saturation: 99 %
Patient temperature: 100.8
Patient temperature: 99.5
Patient temperature: 99.4
Patient temperature: 100.8
Potassium: 3.6 mmol/L (ref 3.5–5.1)
Potassium: 3.6 mmol/L (ref 3.5–5.1)
Potassium: 3.4 mmol/L — ABNORMAL LOW (ref 3.5–5.1)
Potassium: 3.7 mmol/L (ref 3.5–5.1)
Sodium: 142 mmol/L (ref 135–145)
Sodium: 142 mmol/L (ref 135–145)
Sodium: 143 mmol/L (ref 135–145)
Sodium: 143 mmol/L (ref 135–145)
TCO2: 26 mmol/L (ref 22–32)
TCO2: 27 mmol/L (ref 22–32)
TCO2: 29 mmol/L (ref 22–32)
TCO2: 28 mmol/L (ref 22–32)
pCO2 arterial: 40.4 mmHg (ref 32.0–48.0)
pCO2 arterial: 45 mmHg (ref 32.0–48.0)
pCO2 arterial: 52 mmHg — ABNORMAL HIGH (ref 32.0–48.0)
pCO2 arterial: 50.4 mmHg — ABNORMAL HIGH (ref 32.0–48.0)
pH, Arterial: 7.396 (ref 7.350–7.450)
pH, Arterial: 7.36 (ref 7.350–7.450)
pH, Arterial: 7.329 — ABNORMAL LOW (ref 7.350–7.450)
pH, Arterial: 7.34 — ABNORMAL LOW (ref 7.350–7.450)
pO2, Arterial: 64 mmHg — ABNORMAL LOW (ref 83.0–108.0)
pO2, Arterial: 70 mmHg — ABNORMAL LOW (ref 83.0–108.0)
pO2, Arterial: 88 mmHg (ref 83.0–108.0)
pO2, Arterial: 137 mmHg — ABNORMAL HIGH (ref 83.0–108.0)

## 2021-02-21 LAB — GLUCOSE, CAPILLARY
Glucose-Capillary: 114 mg/dL — ABNORMAL HIGH (ref 70–99)
Glucose-Capillary: 127 mg/dL — ABNORMAL HIGH (ref 70–99)
Glucose-Capillary: 136 mg/dL — ABNORMAL HIGH (ref 70–99)
Glucose-Capillary: 156 mg/dL — ABNORMAL HIGH (ref 70–99)
Glucose-Capillary: 159 mg/dL — ABNORMAL HIGH (ref 70–99)
Glucose-Capillary: 184 mg/dL — ABNORMAL HIGH (ref 70–99)
Glucose-Capillary: 98 mg/dL (ref 70–99)

## 2021-02-21 LAB — BASIC METABOLIC PANEL
Anion gap: 5 (ref 5–15)
BUN: 11 mg/dL (ref 6–20)
CO2: 23 mmol/L (ref 22–32)
Calcium: 7.6 mg/dL — ABNORMAL LOW (ref 8.9–10.3)
Chloride: 110 mmol/L (ref 98–111)
Creatinine, Ser: 0.44 mg/dL — ABNORMAL LOW (ref 0.61–1.24)
GFR, Estimated: >60 mL/min (ref >60)
Glucose, Bld: 148 mg/dL — ABNORMAL HIGH (ref 70–99)
Potassium: 3.4 mmol/L — ABNORMAL LOW (ref 3.5–5.1)
Sodium: 138 mmol/L (ref 135–145)

## 2021-02-21 MED ORDER — ASPIRIN 325 MG PO TABS
325.0000 mg | ORAL_TABLET | Freq: Every day | ORAL | Status: DC
  Administered 2021-02-21: 325 mg
  Filled 2021-02-21: qty 1

## 2021-02-21 MED ORDER — SODIUM CHLORIDE 0.9% FLUSH: 5.0000 mL | Freq: Once | INTRAVENOUS | Status: DC

## 2021-02-21 MED ORDER — ARTIFICIAL TEARS OPHTHALMIC OINT
1.0000 "application " | TOPICAL_OINTMENT | Freq: Three times a day (TID) | OPHTHALMIC | Status: DC
  Administered 2021-02-21: 1 via OPHTHALMIC
  Filled 2021-02-21: qty 3.5

## 2021-02-21 MED ORDER — LEVETIRACETAM 100 MG/ML PO SOLN: 500.0000 mg | Freq: Two times a day (BID) | ORAL | Status: DC

## 2021-02-21 MED ORDER — ESMOLOL HCL-SODIUM CHLORIDE 2000 MG/100ML IV SOLN
25.0000 ug/kg/min | INTRAVENOUS | Status: DC
Start: 2021-02-21 — End: 2021-02-27
  Administered 2021-02-21: 25 ug/kg/min via INTRAVENOUS
  Administered 2021-02-22 (×2): 125 ug/kg/min via INTRAVENOUS
  Administered 2021-02-22: 25 ug/kg/min via INTRAVENOUS
  Administered 2021-02-22: 75 ug/kg/min via INTRAVENOUS
  Administered 2021-02-22 – 2021-02-23 (×2): 125 ug/kg/min via INTRAVENOUS
  Administered 2021-02-23 (×5): 100 ug/kg/min via INTRAVENOUS
  Administered 2021-02-24 (×2): 125 ug/kg/min via INTRAVENOUS
  Administered 2021-02-24: 175 ug/kg/min via INTRAVENOUS
  Administered 2021-02-24 – 2021-02-25 (×5): 150 ug/kg/min via INTRAVENOUS
  Administered 2021-02-25: 175 ug/kg/min via INTRAVENOUS
  Administered 2021-02-25: 150 ug/kg/min via INTRAVENOUS
  Administered 2021-02-25: 250 ug/kg/min via INTRAVENOUS
  Administered 2021-02-25: 150 ug/kg/min via INTRAVENOUS
  Administered 2021-02-26: 200 ug/kg/min via INTRAVENOUS
  Administered 2021-02-26 (×5): 300 ug/kg/min via INTRAVENOUS
  Administered 2021-02-26: 200 ug/kg/min via INTRAVENOUS
  Administered 2021-02-26 (×2): 300 ug/kg/min via INTRAVENOUS
  Administered 2021-02-26 (×2): 200 ug/kg/min via INTRAVENOUS
  Administered 2021-02-26 – 2021-02-27 (×9): 300 ug/kg/min via INTRAVENOUS
  Filled 2021-02-21 (×55): qty 100

## 2021-02-21 MED ORDER — VANCOMYCIN HCL 2000 MG/400ML IV SOLN
2000.0000 mg | Freq: Once | INTRAVENOUS | Status: AC
  Administered 2021-02-21: 2000 mg via INTRAVENOUS
  Filled 2021-02-21: qty 400

## 2021-02-21 MED ORDER — ENOXAPARIN SODIUM 40 MG/0.4ML IJ SOSY: 40.0000 mg | PREFILLED_SYRINGE | Freq: Once | INTRAMUSCULAR | Status: DC

## 2021-02-21 MED ORDER — POTASSIUM CHLORIDE 10 MEQ/100ML IV SOLN
10.0000 meq | INTRAVENOUS | Status: AC
  Administered 2021-02-21 (×2): 10 meq via INTRAVENOUS
  Filled 2021-02-21 (×3): qty 100

## 2021-02-21 MED ORDER — VANCOMYCIN HCL 1250 MG/250ML IV SOLN
1250.0000 mg | Freq: Three times a day (TID) | INTRAVENOUS | Status: DC
  Administered 2021-02-21 – 2021-02-22 (×4): 1250 mg via INTRAVENOUS
  Filled 2021-02-21 (×5): qty 250

## 2021-02-21 MED ORDER — PROPOFOL 1000 MG/100ML IV EMUL
5.0000 ug/kg/min | INTRAVENOUS | Status: DC
  Administered 2021-02-21: 7 ug/kg/min via INTRAVENOUS
  Administered 2021-02-21: 75 ug/kg/min via INTRAVENOUS
  Administered 2021-02-22: 60 ug/kg/min via INTRAVENOUS
  Administered 2021-02-22: 75 ug/kg/min via INTRAVENOUS
  Administered 2021-02-22 (×2): 65 ug/kg/min via INTRAVENOUS
  Filled 2021-02-21: qty 300
  Filled 2021-02-21: qty 200
  Filled 2021-02-21 (×2): qty 100

## 2021-02-21 MED ORDER — SODIUM CHLORIDE 0.9 % IV SOLN: INTRAVENOUS | Status: DC | PRN

## 2021-02-21 MED ORDER — FUROSEMIDE 10 MG/ML IJ SOLN
INTRAMUSCULAR | Status: AC
  Administered 2021-02-21: 20 mg
  Filled 2021-02-21: qty 2

## 2021-02-21 MED ORDER — ENOXAPARIN SODIUM 30 MG/0.3ML IJ SOSY
30.0000 mg | PREFILLED_SYRINGE | Freq: Two times a day (BID) | INTRAMUSCULAR | Status: DC
  Administered 2021-02-21 – 2021-02-26 (×10): 30 mg via SUBCUTANEOUS
  Filled 2021-02-21 (×10): qty 0.3

## 2021-02-21 MED ORDER — METOPROLOL TARTRATE 5 MG/5ML IV SOLN
5.0000 mg | INTRAVENOUS | Status: DC | PRN
  Administered 2021-02-21 – 2021-02-26 (×5): 5 mg via INTRAVENOUS
  Filled 2021-02-21 (×5): qty 5

## 2021-02-21 MED ORDER — ARTIFICIAL TEARS OPHTHALMIC OINT
1.0000 | TOPICAL_OINTMENT | Freq: Three times a day (TID) | OPHTHALMIC | Status: DC
Start: 2021-02-21 — End: 2021-02-27
  Administered 2021-02-21 – 2021-02-26 (×14): 1 via OPHTHALMIC
  Filled 2021-02-21 (×4): qty 3.5

## 2021-02-21 MED ORDER — SODIUM CHLORIDE 0.9 % IV SOLN
0.0000 ug/kg/min | INTRAVENOUS | Status: DC
  Administered 2021-02-21: 3 ug/kg/min via INTRAVENOUS
  Administered 2021-02-22: 3.5 ug/kg/min via INTRAVENOUS
  Administered 2021-02-22: 3 ug/kg/min via INTRAVENOUS
  Administered 2021-02-23 – 2021-02-24 (×2): 3.5 ug/kg/min via INTRAVENOUS
  Filled 2021-02-21 (×6): qty 20

## 2021-02-21 MED ORDER — SODIUM CHLORIDE 0.9 % IV SOLN
2.0000 g | Freq: Three times a day (TID) | INTRAVENOUS | Status: AC
  Administered 2021-02-21 – 2021-03-02 (×29): 2 g via INTRAVENOUS
  Filled 2021-02-21 (×29): qty 2

## 2021-02-21 MED ORDER — PROSOURCE TF PO LIQD
45.0000 mL | Freq: Every day | ORAL | Status: DC
  Administered 2021-02-21 – 2021-03-02 (×9): 45 mL
  Filled 2021-02-21 (×9): qty 45

## 2021-02-21 MED ORDER — LEVETIRACETAM 100 MG/ML PO SOLN
500.0000 mg | Freq: Two times a day (BID) | ORAL | Status: DC
  Administered 2021-02-21: 500 mg
  Filled 2021-02-21: qty 5

## 2021-02-21 MED ORDER — IPRATROPIUM-ALBUTEROL 0.5-2.5 (3) MG/3ML IN SOLN
3.0000 mL | RESPIRATORY_TRACT | Status: DC
  Administered 2021-02-21 – 2021-02-24 (×17): 3 mL via RESPIRATORY_TRACT
  Filled 2021-02-21 (×18): qty 3
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
## 2021-02-21 NOTE — Procedures (Signed)
Arterial Catheter Insertion Procedure Note  Scott [name]  [id number]  09-02-1985  Date:02/21/21  Time:4:04 PM    Provider Performing: Elyn Peers    Procedure: Insertion of Arterial Line (12197) without US guidance  Indication(s) Blood pressure monitoring and/or need for frequent ABGs  Consent Risks of the procedure as well as the alternatives and risks of each were explained to the patient and/or caregiver.  Consent for the procedure was obtained and is signed in the bedside chart  Anesthesia None   Time Out Verified patient identification, verified procedure, site/side was marked, verified correct patient position, special equipment/implants available, medications/allergies/relevant history reviewed, required imaging and test results available.   Sterile Technique Maximal sterile technique including full sterile barrier drape, hand hygiene, sterile gown, sterile gloves, mask, hair covering, sterile ultrasound probe cover (if used).   Procedure Description Area of catheter insertion was cleaned with chlorhexidine and draped in sterile fashion. Without real-time ultrasound guidance an arterial catheter was placed into the left radial artery.  Appropriate arterial tracings confirmed on monitor.     Complications/Tolerance None; patient tolerated the procedure well.   EBL Minimal   Specimen(s) None

## 2021-02-21 NOTE — Progress Notes (Signed)
Patient with overnight asynchrony, PRN vecuronium given x1. Patient synchronous this AM but copious brown/tan secretions. Spoke with Dr Dwain Sarna and order received for CXR and sputum cx. Results called to Dr Dwain Sarna, patient remains sedated and compliant with the ventilator. Will attempt initiation of precedex if agitation resumes.
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
## 2021-02-21 NOTE — Progress Notes (Signed)
ABG results ordered and obtained on ventilator settings of VT: 600, RR: 20, FIO2: 60%, and PEEP: 5.  Results given to MD and RT instructed that could make changes to ventilator based on ARDS protocol.  Patient placed on VT: 530 (patient's 7cc) due to patient's plateau being >30.  RR increased to 28.  FIO2 increased to 80% and PEEP increased to 12.  Patient also received PRN dose of Vecuronium by RN for ventilator synchrony.  Will obtain ABG in one hour for follow up based on changes made.    Ref. Range 02/21/2021 08:41  Sample type Unknown ARTERIAL  pH, Arterial Latest Ref Range: 7.350 - 7.450  7.396  pCO2 arterial Latest Ref Range: 32.0 - 48.0 mmHg 40.4  pO2, Arterial Latest Ref Range: 83.0 - 108.0 mmHg 64 (L)  TCO2 Latest Ref Range: 22 - 32 mmol/L 26  Acid-Base Excess Latest Ref Range: 0.0 - 2.0 mmol/L 0.0  Bicarbonate Latest Ref Range: 20.0 - 28.0 mmol/L 24.5  O2 Saturation Latest Units: % 91.0  Patient temperature Unknown 100.8 F  Collection site Unknown Radial

## 2021-02-21 NOTE — Progress Notes (Signed)
Pt noted to have distended abdomen with hyperresonance noted. TMD notified, ok to place OG for decompression and obtain port Abd film for confirmation. Pt remains tachycardic even with sedation and paralytics.   [place], [place] [phone]

## 2021-02-21 NOTE — Progress Notes (Signed)
ABG obtained on ventilator settings of VT: 530, RR: 28, FIO2: 80%, and PEEP: 12.  Increased PEEP to 14.  RN also paged MD and Lasix ordered.  Patient given nebulizer and CPT performed as well.    Ref. Range 02/21/2021 10:45  Sample type Unknown ARTERIAL  pH, Arterial Latest Ref Range: 7.350 - 7.450  7.360  pCO2 arterial Latest Ref Range: 32.0 - 48.0 mmHg 45.0  pO2, Arterial Latest Ref Range: 83.0 - 108.0 mmHg 70 (L)  TCO2 Latest Ref Range: 22 - 32 mmol/L 27  Acid-Base Excess Latest Ref Range: 0.0 - 2.0 mmol/L 0.0  Bicarbonate Latest Ref Range: 20.0 - 28.0 mmol/L 25.3  O2 Saturation Latest Units: % 93.0  Patient temperature Unknown 99.5 F  Collection site Unknown Radial

## 2021-02-22 ENCOUNTER — Inpatient Hospital Stay (HOSPITAL_COMMUNITY): Payer: 59

## 2021-02-22 LAB — POCT I-STAT 7, (LYTES, BLD GAS, ICA,H+H)
Acid-Base Excess: 3 mmol/L — ABNORMAL HIGH (ref 0.0–2.0)
Acid-Base Excess: 2 mmol/L (ref 0.0–2.0)
Bicarbonate: 28.2 mmol/L — ABNORMAL HIGH (ref 20.0–28.0)
Bicarbonate: 27.5 mmol/L (ref 20.0–28.0)
Calcium, Ion: 1.22 mmol/L (ref 1.15–1.40)
Calcium, Ion: 1.22 mmol/L (ref 1.15–1.40)
HCT: 26 % — ABNORMAL LOW (ref 39.0–52.0)
HCT: 26 % — ABNORMAL LOW (ref 39.0–52.0)
Hemoglobin: 8.8 g/dL — ABNORMAL LOW (ref 13.0–17.0)
Hemoglobin: 8.8 g/dL — ABNORMAL LOW (ref 13.0–17.0)
O2 Saturation: 98 %
O2 Saturation: 91 %
Patient temperature: 99.9
Patient temperature: 99.2
Potassium: 3.8 mmol/L (ref 3.5–5.1)
Potassium: 3.8 mmol/L (ref 3.5–5.1)
Sodium: 143 mmol/L (ref 135–145)
Sodium: 144 mmol/L (ref 135–145)
TCO2: 30 mmol/L (ref 22–32)
TCO2: 29 mmol/L (ref 22–32)
pCO2 arterial: 47 mmHg (ref 32.0–48.0)
pCO2 arterial: 46.9 mmHg (ref 32.0–48.0)
pH, Arterial: 7.389 (ref 7.350–7.450)
pH, Arterial: 7.378 (ref 7.350–7.450)
pO2, Arterial: 107 mmHg (ref 83.0–108.0)
pO2, Arterial: 64 mmHg — ABNORMAL LOW (ref 83.0–108.0)

## 2021-02-22 LAB — PHOSPHORUS: Phosphorus: 2.8 mg/dL (ref 2.5–4.6)

## 2021-02-22 LAB — CULTURE, BLOOD (ROUTINE X 2)
Culture: NO GROWTH
Culture: NO GROWTH
Special Requests: ADEQUATE
Special Requests: ADEQUATE

## 2021-02-22 LAB — GLUCOSE, CAPILLARY
Glucose-Capillary: 118 mg/dL — ABNORMAL HIGH (ref 70–99)
Glucose-Capillary: 107 mg/dL — ABNORMAL HIGH (ref 70–99)
Glucose-Capillary: 122 mg/dL — ABNORMAL HIGH (ref 70–99)
Glucose-Capillary: 136 mg/dL — ABNORMAL HIGH (ref 70–99)
Glucose-Capillary: 100 mg/dL — ABNORMAL HIGH (ref 70–99)
Glucose-Capillary: 94 mg/dL (ref 70–99)

## 2021-02-22 LAB — VANCOMYCIN, PEAK: Vancomycin Pk: 21 ug/mL — ABNORMAL LOW (ref 30–40)

## 2021-02-22 LAB — TRIGLYCERIDES: Triglycerides: 141 mg/dL (ref <150)

## 2021-02-22 LAB — BASIC METABOLIC PANEL
Anion gap: 6 (ref 5–15)
BUN: 14 mg/dL (ref 6–20)
CO2: 27 mmol/L (ref 22–32)
Calcium: 8.3 mg/dL — ABNORMAL LOW (ref 8.9–10.3)
Chloride: 109 mmol/L (ref 98–111)
Creatinine, Ser: 0.59 mg/dL — ABNORMAL LOW (ref 0.61–1.24)
GFR, Estimated: >60 mL/min (ref >60)
Glucose, Bld: 116 mg/dL — ABNORMAL HIGH (ref 70–99)
Potassium: 3.8 mmol/L (ref 3.5–5.1)
Sodium: 142 mmol/L (ref 135–145)

## 2021-02-22 LAB — CBC
HCT: 27.1 % — ABNORMAL LOW (ref 39.0–52.0)
Hemoglobin: 8.9 g/dL — ABNORMAL LOW (ref 13.0–17.0)
MCH: 31.8 pg (ref 26.0–34.0)
MCHC: 32.8 g/dL (ref 30.0–36.0)
MCV: 96.8 fL (ref 80.0–100.0)
Platelets: 240 10*3/uL (ref 150–400)
RBC: 2.8 MIL/uL — ABNORMAL LOW (ref 4.22–5.81)
RDW: 15.2 % (ref 11.5–15.5)
WBC: 10.4 10*3/uL (ref 4.0–10.5)
nRBC: 0.2 % (ref 0.0–0.2)

## 2021-02-22 LAB — VANCOMYCIN, TROUGH: Vancomycin Tr: 11 ug/mL — ABNORMAL LOW (ref 15–20)

## 2021-02-22 LAB — MAGNESIUM: Magnesium: 2.3 mg/dL (ref 1.7–2.4)

## 2021-02-22 MED ORDER — POTASSIUM PHOSPHATES 15 MMOLE/5ML IV SOLN
20.0000 mmol | Freq: Once | INTRAVENOUS | Status: AC
  Administered 2021-02-22: 20 mmol via INTRAVENOUS
  Filled 2021-02-22: qty 6.67

## 2021-02-22 MED ORDER — VANCOMYCIN HCL 10 G IV SOLR
250.0000 mg | Freq: Once | INTRAVENOUS | Status: DC
  Filled 2021-02-22: qty 2.5

## 2021-02-22 MED ORDER — SODIUM CHLORIDE 0.9 % IV SOLN
0.0000 mg/h | INTRAVENOUS | Status: DC
Start: 2021-02-22 — End: 2021-02-22
  Administered 2021-02-22: 0.5 mg/h via INTRAVENOUS
  Filled 2021-02-22: qty 5

## 2021-02-22 MED ORDER — SODIUM CHLORIDE 0.9 % IV SOLN
1.0000 mg/h | INTRAVENOUS | Status: DC
  Administered 2021-02-22: 1 mg/h via INTRAVENOUS
  Administered 2021-02-23 – 2021-02-25 (×2): 1.5 mg/h via INTRAVENOUS
  Administered 2021-02-25: 3 mg/h via INTRAVENOUS
  Administered 2021-02-26 – 2021-02-27 (×2): 4 mg/h via INTRAVENOUS
  Administered 2021-02-28 – 2021-03-01 (×2): 3 mg/h via INTRAVENOUS
  Administered 2021-03-01 – 2021-03-06 (×10): 4 mg/h via INTRAVENOUS
  Administered 2021-03-07 – 2021-03-11 (×4): 1 mg/h via INTRAVENOUS
  Administered 2021-03-13: 2.5 mg/h via INTRAVENOUS
  Administered 2021-03-14: 3 mg/h via INTRAVENOUS
  Administered 2021-03-14: 3.5 mg/h via INTRAVENOUS
  Administered 2021-03-15: 2 mg/h via INTRAVENOUS
  Administered 2021-03-15: 3 mg/h via INTRAVENOUS
  Filled 2021-02-22 (×35): qty 5

## 2021-02-22 MED ORDER — CLONAZEPAM 0.5 MG PO TABS
2.0000 mg | ORAL_TABLET | Freq: Three times a day (TID) | ORAL | Status: DC
  Administered 2021-02-22 – 2021-03-27 (×97): 2 mg
  Filled 2021-02-22 (×10): qty 2
  Filled 2021-02-22: qty 4
  Filled 2021-02-22 (×23): qty 2
  Filled 2021-02-22: qty 4
  Filled 2021-02-22 (×49): qty 2
  Filled 2021-02-22: qty 4
  Filled 2021-02-22 (×13): qty 2

## 2021-02-22 MED ORDER — QUETIAPINE FUMARATE 200 MG PO TABS
200.0000 mg | ORAL_TABLET | Freq: Two times a day (BID) | ORAL | Status: DC
  Administered 2021-02-22 – 2021-03-17 (×47): 200 mg
  Filled 2021-02-22 (×12): qty 1
  Filled 2021-02-22: qty 2
  Filled 2021-02-22 (×35): qty 1

## 2021-02-22 MED ORDER — FUROSEMIDE 10 MG/ML IJ SOLN
20.0000 mg | Freq: Once | INTRAMUSCULAR | Status: AC
  Administered 2021-02-22: 20 mg via INTRAVENOUS
  Filled 2021-02-22: qty 2

## 2021-02-22 MED ORDER — VANCOMYCIN HCL 1500 MG/300ML IV SOLN
1500.0000 mg | Freq: Three times a day (TID) | INTRAVENOUS | Status: DC
  Administered 2021-02-23 – 2021-02-24 (×4): 1500 mg via INTRAVENOUS
  Filled 2021-02-22 (×5): qty 300

## 2021-02-22 MED ORDER — PANTOPRAZOLE SODIUM 40 MG IV SOLR
40.0000 mg | INTRAVENOUS | Status: DC
  Administered 2021-02-22 – 2021-03-02 (×9): 40 mg via INTRAVENOUS
  Filled 2021-02-22 (×9): qty 40

## 2021-02-22 MED ORDER — VANCOMYCIN HCL 1000 MG IV SOLR
250.0000 mg | Freq: Once | INTRAVENOUS | Status: AC
  Administered 2021-02-22: 250 mg via INTRAVENOUS
  Filled 2021-02-22: qty 5

## 2021-02-22 MED ORDER — LEVETIRACETAM IN NACL 500 MG/100ML IV SOLN
500.0000 mg | Freq: Two times a day (BID) | INTRAVENOUS | Status: DC
  Administered 2021-02-22 – 2021-02-27 (×12): 500 mg via INTRAVENOUS
  Filled 2021-02-22 (×12): qty 100

## 2021-02-22 MED ORDER — HYDROMORPHONE BOLUS VIA INFUSION
0.5000 mg | INTRAVENOUS | Status: DC | PRN
  Administered 2021-02-22 – 2021-03-07 (×7): 0.5 mg via INTRAVENOUS
  Filled 2021-02-22: qty 1

## 2021-02-22 MED ORDER — ASPIRIN 300 MG RE SUPP
300.0000 mg | Freq: Every day | RECTAL | Status: DC
  Administered 2021-02-22 – 2021-02-28 (×7): 300 mg via RECTAL
  Filled 2021-02-22 (×8): qty 1

## 2021-02-22 MED ORDER — PROPOFOL 1000 MG/100ML IV EMUL
25.0000 ug/kg/min | INTRAVENOUS | Status: DC
  Administered 2021-02-22 (×2): 50 ug/kg/min via INTRAVENOUS
  Administered 2021-02-22 – 2021-02-24 (×9): 40 ug/kg/min via INTRAVENOUS
  Administered 2021-02-24: 35 ug/kg/min via INTRAVENOUS
  Administered 2021-02-24 – 2021-03-01 (×3): 40 ug/kg/min via INTRAVENOUS
  Administered 2021-03-01: 25 ug/kg/min via INTRAVENOUS
  Administered 2021-03-01 – 2021-03-02 (×3): 40 ug/kg/min via INTRAVENOUS
  Administered 2021-03-02: 45 ug/kg/min via INTRAVENOUS
  Administered 2021-03-02: 50 ug/kg/min via INTRAVENOUS
  Administered 2021-03-02: 40 ug/kg/min via INTRAVENOUS
  Administered 2021-03-03 (×3): 60 ug/kg/min via INTRAVENOUS
  Administered 2021-03-03 (×2): 70 ug/kg/min via INTRAVENOUS
  Administered 2021-03-03: 80 ug/kg/min via INTRAVENOUS
  Administered 2021-03-03: 60 ug/kg/min via INTRAVENOUS
  Administered 2021-03-03: 70 ug/kg/min via INTRAVENOUS
  Administered 2021-03-03: 60 ug/kg/min via INTRAVENOUS
  Administered 2021-03-04 (×3): 80 ug/kg/min via INTRAVENOUS
  Administered 2021-03-04: 70 ug/kg/min via INTRAVENOUS
  Administered 2021-03-04: 80 ug/kg/min via INTRAVENOUS
  Administered 2021-03-04: 70 ug/kg/min via INTRAVENOUS
  Administered 2021-03-04 (×3): 80 ug/kg/min via INTRAVENOUS
  Administered 2021-03-05 (×2): 60 ug/kg/min via INTRAVENOUS
  Administered 2021-03-05: 70 ug/kg/min via INTRAVENOUS
  Administered 2021-03-05: 80 ug/kg/min via INTRAVENOUS
  Administered 2021-03-05 (×2): 60 ug/kg/min via INTRAVENOUS
  Administered 2021-03-05: 70 ug/kg/min via INTRAVENOUS
  Administered 2021-03-05: 60 ug/kg/min via INTRAVENOUS
  Administered 2021-03-06: 40 ug/kg/min via INTRAVENOUS
  Administered 2021-03-06 (×2): 60 ug/kg/min via INTRAVENOUS
  Administered 2021-03-06: 50 ug/kg/min via INTRAVENOUS
  Administered 2021-03-07: 30 ug/kg/min via INTRAVENOUS
  Administered 2021-03-07: 20 ug/kg/min via INTRAVENOUS
  Administered 2021-03-07 (×2): 30 ug/kg/min via INTRAVENOUS
  Administered 2021-03-08 (×2): 20 ug/kg/min via INTRAVENOUS
  Administered 2021-03-08: 30 ug/kg/min via INTRAVENOUS
  Administered 2021-03-09: 20 ug/kg/min via INTRAVENOUS
  Administered 2021-03-09 – 2021-03-11 (×4): 10 ug/kg/min via INTRAVENOUS
  Administered 2021-03-11: 15 ug/kg/min via INTRAVENOUS
  Filled 2021-02-22 (×4): qty 100
  Filled 2021-02-22: qty 200
  Filled 2021-02-22 (×3): qty 100
  Filled 2021-02-22: qty 200
  Filled 2021-02-22 (×3): qty 100
  Filled 2021-02-22: qty 200
  Filled 2021-02-22: qty 100
  Filled 2021-02-22: qty 300
  Filled 2021-02-22 (×9): qty 100
  Filled 2021-02-22: qty 200
  Filled 2021-02-22 (×11): qty 100
  Filled 2021-02-22: qty 200
  Filled 2021-02-22 (×12): qty 100
  Filled 2021-02-22: qty 200
  Filled 2021-02-22 (×11): qty 100

## 2021-02-22 NOTE — Progress Notes (Signed)
Patient ID: QUINT CHESTNUT, male   DOB: [birth year], 35 y.o.   MRN: [id number]  Informed today of pt's clearance for surgery. Will schedule ORIF left clavicle for Thursday with Dr. [name]. Please hold TF Wed at [facility].    Freeman Caldron, PA-C Orthopedic Surgery [phone]
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
## 2021-02-23 ENCOUNTER — Inpatient Hospital Stay (HOSPITAL_COMMUNITY): Payer: 59

## 2021-02-23 LAB — CULTURE, RESPIRATORY W GRAM STAIN

## 2021-02-23 LAB — BASIC METABOLIC PANEL
Anion gap: 5 (ref 5–15)
BUN: 18 mg/dL (ref 6–20)
CO2: 30 mmol/L (ref 22–32)
Calcium: 8.7 mg/dL — ABNORMAL LOW (ref 8.9–10.3)
Chloride: 109 mmol/L (ref 98–111)
Creatinine, Ser: 0.77 mg/dL (ref 0.61–1.24)
GFR, Estimated: >60 mL/min (ref >60)
Glucose, Bld: 112 mg/dL — ABNORMAL HIGH (ref 70–99)
Potassium: 4.1 mmol/L (ref 3.5–5.1)
Sodium: 144 mmol/L (ref 135–145)

## 2021-02-23 LAB — POCT I-STAT 7, (LYTES, BLD GAS, ICA,H+H)
Acid-Base Excess: 4 mmol/L — ABNORMAL HIGH (ref 0.0–2.0)
Acid-Base Excess: 3 mmol/L — ABNORMAL HIGH (ref 0.0–2.0)
Bicarbonate: 32 mmol/L — ABNORMAL HIGH (ref 20.0–28.0)
Bicarbonate: 29.9 mmol/L — ABNORMAL HIGH (ref 20.0–28.0)
Calcium, Ion: 1.27 mmol/L (ref 1.15–1.40)
Calcium, Ion: 1.26 mmol/L (ref 1.15–1.40)
HCT: 24 % — ABNORMAL LOW (ref 39.0–52.0)
HCT: 24 % — ABNORMAL LOW (ref 39.0–52.0)
Hemoglobin: 8.2 g/dL — ABNORMAL LOW (ref 13.0–17.0)
Hemoglobin: 8.2 g/dL — ABNORMAL LOW (ref 13.0–17.0)
O2 Saturation: 92 %
O2 Saturation: 92 %
Patient temperature: 101.8
Patient temperature: 102.1
Potassium: 4.1 mmol/L (ref 3.5–5.1)
Potassium: 4 mmol/L (ref 3.5–5.1)
Sodium: 144 mmol/L (ref 135–145)
Sodium: 146 mmol/L — ABNORMAL HIGH (ref 135–145)
TCO2: 34 mmol/L — ABNORMAL HIGH (ref 22–32)
TCO2: 32 mmol/L (ref 22–32)
pCO2 arterial: 73.1 mmHg (ref 32.0–48.0)
pCO2 arterial: 61.9 mmHg — ABNORMAL HIGH (ref 32.0–48.0)
pH, Arterial: 7.259 — ABNORMAL LOW (ref 7.350–7.450)
pH, Arterial: 7.301 — ABNORMAL LOW (ref 7.350–7.450)
pO2, Arterial: 83 mmHg (ref 83.0–108.0)
pO2, Arterial: 79 mmHg — ABNORMAL LOW (ref 83.0–108.0)

## 2021-02-23 LAB — GLUCOSE, CAPILLARY
Glucose-Capillary: 103 mg/dL — ABNORMAL HIGH (ref 70–99)
Glucose-Capillary: 106 mg/dL — ABNORMAL HIGH (ref 70–99)
Glucose-Capillary: 108 mg/dL — ABNORMAL HIGH (ref 70–99)
Glucose-Capillary: 112 mg/dL — ABNORMAL HIGH (ref 70–99)
Glucose-Capillary: 112 mg/dL — ABNORMAL HIGH (ref 70–99)
Glucose-Capillary: 115 mg/dL — ABNORMAL HIGH (ref 70–99)

## 2021-02-23 LAB — CBC
HCT: 28.6 % — ABNORMAL LOW (ref 39.0–52.0)
Hemoglobin: 9.3 g/dL — ABNORMAL LOW (ref 13.0–17.0)
MCH: 32.1 pg (ref 26.0–34.0)
MCHC: 32.5 g/dL (ref 30.0–36.0)
MCV: 98.6 fL (ref 80.0–100.0)
Platelets: 317 10*3/uL (ref 150–400)
RBC: 2.9 MIL/uL — ABNORMAL LOW (ref 4.22–5.81)
RDW: 15.9 % — ABNORMAL HIGH (ref 11.5–15.5)
WBC: 20.5 10*3/uL — ABNORMAL HIGH (ref 4.0–10.5)
nRBC: 0.4 % — ABNORMAL HIGH (ref 0.0–0.2)

## 2021-02-23 LAB — MAGNESIUM: Magnesium: 2.6 mg/dL — ABNORMAL HIGH (ref 1.7–2.4)

## 2021-02-23 LAB — PHOSPHORUS: Phosphorus: 3 mg/dL (ref 2.5–4.6)

## 2021-02-23 MED ORDER — FUROSEMIDE 10 MG/ML IJ SOLN
20.0000 mg | Freq: Once | INTRAMUSCULAR | Status: AC
  Administered 2021-02-23: 20 mg via INTRAVENOUS
  Filled 2021-02-23: qty 2
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
## 2021-02-23 NOTE — Progress Notes (Signed)
This RN paged Trauma MD on call Dr. Bedelia Person about patient's temperature of 101.8. No new orders at this time. RN will give scheduled tylenol at 0500, place ice packs, and continue to monitor.   Harriett Sine, RN

## 2021-02-23 NOTE — Progress Notes (Signed)
RT obtained ABG at this time on PRVC VT460, RR 28, FIO2 65%, PEEP 16.  Ph 7.259 PCO2 73.1 Critical value reported to RN at this time. PO2 83 HCO3 32 SO2  92%  Pt's VT setting was on 460 when RT entered room. Unsure when change was made as no change was charted. RT placed patient back on original setting of VT 530.

## 2021-02-23 NOTE — Progress Notes (Signed)
This RN received critical lab value by Kellie Simmering, RT.  Critical Value: PCO2 of 73.1 MD paged: Yes; Dr. Bedelia Person New orders received: Advise RT to take a verbal order to switch VT setting from 530 to 460. Jomarie Longs, RT paged by this RN and made aware of new orders.   Harriett Sine, RN

## 2021-02-23 NOTE — Progress Notes (Signed)
Orthopaedic Trauma Service   Follow up xrays of L clavicle reviewed Length and alignment maintained  Upon review of CT pt really has fracture lines that extend essentially the entire length of his clavicle. Do not necessarily think we can get adequate fixation though as stated previously ORIF is recommended due to concomitant scapula fracture   Pt is also febrile today  Will continue to manage non-operatively   Gentle ROM L shoulder permitted NWB L UEx  Sling for comfort once extubated and participatory with therapies   Mearl Latin, PA-C [phone] (C) 02/23/2021, 9:09 AM  [facility] [address] [place] [address] [phone] Collier Bullock (F)
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
## 2021-02-24 ENCOUNTER — Encounter (HOSPITAL_COMMUNITY): Admission: EM | Disposition: A | Discharge status: Rehabilitation Facility | Payer: Self-pay | Source: Home / Self Care

## 2021-02-24 LAB — PHOSPHORUS: Phosphorus: 2.2 mg/dL — ABNORMAL LOW (ref 2.5–4.6)

## 2021-02-24 LAB — POCT I-STAT 7, (LYTES, BLD GAS, ICA,H+H)
Acid-Base Excess: 3 mmol/L — ABNORMAL HIGH (ref 0.0–2.0)
Bicarbonate: 29.3 mmol/L — ABNORMAL HIGH (ref 20.0–28.0)
Calcium, Ion: 1.28 mmol/L (ref 1.15–1.40)
HCT: 28 % — ABNORMAL LOW (ref 39.0–52.0)
Hemoglobin: 9.5 g/dL — ABNORMAL LOW (ref 13.0–17.0)
O2 Saturation: 95 %
Patient temperature: 100.2
Potassium: 3.3 mmol/L — ABNORMAL LOW (ref 3.5–5.1)
Sodium: 149 mmol/L — ABNORMAL HIGH (ref 135–145)
TCO2: 31 mmol/L (ref 22–32)
pCO2 arterial: 57.8 mmHg — ABNORMAL HIGH (ref 32.0–48.0)
pH, Arterial: 7.316 — ABNORMAL LOW (ref 7.350–7.450)
pO2, Arterial: 87 mmHg (ref 83.0–108.0)

## 2021-02-24 LAB — BASIC METABOLIC PANEL
Anion gap: 13 (ref 5–15)
BUN: 20 mg/dL (ref 6–20)
CO2: 27 mmol/L (ref 22–32)
Calcium: 9.2 mg/dL (ref 8.9–10.3)
Chloride: 107 mmol/L (ref 98–111)
Creatinine, Ser: 0.77 mg/dL (ref 0.61–1.24)
GFR, Estimated: >60 mL/min (ref >60)
Glucose, Bld: 111 mg/dL — ABNORMAL HIGH (ref 70–99)
Potassium: 3.1 mmol/L — ABNORMAL LOW (ref 3.5–5.1)
Sodium: 147 mmol/L — ABNORMAL HIGH (ref 135–145)

## 2021-02-24 LAB — CBC
HCT: 25.5 % — ABNORMAL LOW (ref 39.0–52.0)
Hemoglobin: 8.2 g/dL — ABNORMAL LOW (ref 13.0–17.0)
MCH: 31.9 pg (ref 26.0–34.0)
MCHC: 32.2 g/dL (ref 30.0–36.0)
MCV: 99.2 fL (ref 80.0–100.0)
Platelets: 328 10*3/uL (ref 150–400)
RBC: 2.57 MIL/uL — ABNORMAL LOW (ref 4.22–5.81)
RDW: 16.2 % — ABNORMAL HIGH (ref 11.5–15.5)
WBC: 25.4 10*3/uL — ABNORMAL HIGH (ref 4.0–10.5)
nRBC: 0.2 % (ref 0.0–0.2)

## 2021-02-24 LAB — VANCOMYCIN, PEAK: Vancomycin Pk: 39 ug/mL (ref 30–40)

## 2021-02-24 LAB — MONKEYPOX VIRUS DNA, QUALITATIVE REAL-TIME PCR: Orthopoxvirus DNA, QL PCR: NOT DETECTED

## 2021-02-24 LAB — MAGNESIUM: Magnesium: 2.5 mg/dL — ABNORMAL HIGH (ref 1.7–2.4)

## 2021-02-24 LAB — VANCOMYCIN, TROUGH: Vancomycin Tr: 25 ug/mL (ref 15–20)

## 2021-02-24 LAB — GLUCOSE, CAPILLARY
Glucose-Capillary: 113 mg/dL — ABNORMAL HIGH (ref 70–99)
Glucose-Capillary: 136 mg/dL — ABNORMAL HIGH (ref 70–99)
Glucose-Capillary: 153 mg/dL — ABNORMAL HIGH (ref 70–99)
Glucose-Capillary: 112 mg/dL — ABNORMAL HIGH (ref 70–99)
Glucose-Capillary: 115 mg/dL — ABNORMAL HIGH (ref 70–99)

## 2021-02-24 SURGERY — OPEN REDUCTION INTERNAL FIXATION (ORIF) CLAVICULAR FRACTURE
Anesthesia: General | Laterality: Left

## 2021-02-24 MED ORDER — POTASSIUM & SODIUM PHOSPHATES 280-160-250 MG PO PACK
2.0000 | PACK | ORAL | Status: AC
Start: 2021-02-24 — End: 2021-02-24
  Administered 2021-02-24 (×3): 2
  Filled 2021-02-24 (×2): qty 2

## 2021-02-24 MED ORDER — FUROSEMIDE 10 MG/ML IJ SOLN
40.0000 mg | Freq: Once | INTRAMUSCULAR | Status: AC
  Administered 2021-02-24: 40 mg via INTRAVENOUS
  Filled 2021-02-24: qty 4

## 2021-02-24 MED ORDER — BISACODYL 10 MG RE SUPP
10.0000 mg | Freq: Once | RECTAL | Status: AC
  Administered 2021-02-24: 10 mg via RECTAL
  Filled 2021-02-24: qty 1

## 2021-02-24 MED ORDER — VANCOMYCIN HCL IN DEXTROSE 1-5 GM/200ML-% IV SOLN
1000.0000 mg | Freq: Three times a day (TID) | INTRAVENOUS | Status: AC
Start: 2021-02-24 — End: 2021-03-02
  Administered 2021-02-24 – 2021-03-02 (×20): 1000 mg via INTRAVENOUS
  Filled 2021-02-24 (×20): qty 200

## 2021-02-24 MED ORDER — POTASSIUM CHLORIDE 10 MEQ/50ML IV SOLN
10.0000 meq | INTRAVENOUS | Status: AC
  Administered 2021-02-24 (×4): 10 meq via INTRAVENOUS
  Filled 2021-02-24 (×4): qty 50

## 2021-02-24 MED ORDER — POTASSIUM & SODIUM PHOSPHATES 280-160-250 MG PO PACK
2.0000 | PACK | ORAL | Status: DC
Start: 2021-02-24 — End: 2021-02-24
  Filled 2021-02-24: qty 2

## 2021-02-24 MED ORDER — IPRATROPIUM-ALBUTEROL 0.5-2.5 (3) MG/3ML IN SOLN
3.0000 mL | Freq: Four times a day (QID) | RESPIRATORY_TRACT | Status: DC | PRN
  Administered 2021-03-01: 3 mL via RESPIRATORY_TRACT
  Filled 2021-02-24: qty 3

## 2021-02-24 NOTE — Progress Notes (Signed)
[facility]  Discussed with wife rationale for non-op treatment of L clavicle fracture Agreeable with treatment plan   Will continue to follow   Mearl Latin, PA-C [phone] (C) 02/24/2021, 1:49 PM  [facility] [address] [place] [address] [phone] Collier Bullock (F)

## 2021-02-24 NOTE — Progress Notes (Signed)
Notified by nursing staff of repeated requests from patient's significant other to discuss multiple concerns: use of chest PT in the setting of head injury, transfer to [facility], his pneumonia, room temperature, delay/cancellation of orthopedic surgery with significant other raising concern that [facility] has "no staff or is treating other patients ahead of him". Called Ms. Bing Plume this PM to discuss her litany of concerns and provided clinical update on his respiratory progress for the day. She states that she had requested cessation of chest PT and that her requests were ignored. I counseled her that chest PT is one of many tools we are using to improve his pulmonary status, which is his most critical illness at this point. I offered her the option of discontinuation as long as she understands this may be detrimental to his pulmonary status. She inquired about the risk of worsening his head injury. I  informed her this risk is incredibly small and that in my medical opinion, the benefits of this therapy far outweigh the risks. She wanted to know specifics regarding risks, to which I informed her I could not answer. She inquired about modified chest PT, which I told her was not an option. She became quite upset and stated she wanted to have the patient transferred to another hospital. I advised her of the steps required and informed her I and the other staff would assist in the transfer after she identifies an accepting facility and physician. She then returned to inquiring about risks of worsening head injury and modified chest PT. My answers remained the same and this was unsatisfactory to her. She became belligerent, threatening to file a complaint with the hospital, report me to the medical board, and "write negative reviews all over [employer] and everywhere else on the internet". I offered her an opportunity to ask any additional questions or address any other concerns, she declined. The call was terminated.   8/18 AM: I  requested Dr. Lovell Sheehan, the neurosurgeon on call, to reach out to Ms. Bing Plume to provide her with some additional clarity and/or reassurance regarding the negligible risks of chest PT on brain injury and he agreed to speak with her.     Diamantina Monks, MD General and Trauma Surgery [facility]
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
## 2021-02-24 NOTE — Progress Notes (Addendum)
Patient ID: Scott [name], male   DOB: July 09, 1986, 35 y.o.   MRN: [id number] Follow up - Trauma Critical Care  Patient Details:    Scott [name] is an 35 y.o. male.  Lines/tubes : Airway 8 mm (Active)  Secured at (cm) 24 cm 02/24/21 0352  Measured From Lips 02/24/21 0352  Secured Location Center 02/24/21 0352  Secured By [employer] 02/24/21 0352  Tube Holder Repositioned Yes 02/24/21 0352  Prone position No 02/24/21 0352  Cuff Pressure (cm H2O) Clear OR 27-39 CmH2O 02/23/21 1938  Site Condition Dry 02/24/21 0352     PICC Triple Lumen 02/20/21 PICC Right Basilic 39 cm 0 cm (Active)  Indication for Insertion or Continuance of Line Prolonged intravenous therapies 02/23/21 2000  Exposed Catheter (cm) 0 cm 02/20/21 1446  Site Assessment Clean;Dry;Intact 02/23/21 2000  Lumen #1 Status Infusing 02/23/21 2000  Lumen #2 Status Infusing 02/23/21 2000  Lumen #3 Status Infusing 02/23/21 2000  Dressing Type Transparent 02/23/21 2000  Dressing Status Clean;Dry;Intact 02/23/21 2000  Antimicrobial disc in place? Yes 02/23/21 2000  Safety Lock Not Applicable 02/21/21 0800  Line Care Connections checked and tightened 02/23/21 2000  Line Adjustment (NICU/IV Team Only) No 02/20/21 2000  Dressing Intervention New dressing 02/20/21 1446  Dressing Change Due 02/27/21 02/23/21 2000     Arterial Line 02/21/21 Left Radial (Active)  Site Assessment Clean;Dry;Intact 02/23/21 2000  Line Status Pulsatile blood flow 02/23/21 2000  Art Line Waveform Appropriate 02/23/21 2000  Art Line Interventions Connections checked and tightened 02/23/21 2000  Color/Movement/Sensation Capillary refill less than 3 sec 02/23/21 2000  Dressing Type Transparent;Non-restrictive 02/23/21 2000  Dressing Status Clean;Dry;Intact 02/23/21 2000  Dressing Change Due 02/27/21 02/23/21 2000     NG/OG Vented/Dual Lumen Orogastric 16 Fr. Oral (Active)  Tube Position (Required) Marking at nare/corner of mouth  02/23/21 2000  Measurement (cm) (Required) 55 cm 02/23/21 2000  Ongoing Placement Verification (Required) (See row information) Yes 02/23/21 2000  Site Assessment Clean;Dry;Intact 02/23/21 2000  Status Low intermittent suction 02/23/21 2000  Drainage Appearance Tan 02/23/21 2000  Output (mL) 20 mL 02/24/21 0600     Urethral Catheter [name] Troxler Straight-tip 16 Fr. (Active)  Indication for Insertion or Continuance of Catheter Chemically paralyzed patients 02/24/21 0714  Site Assessment Intact;Clean 02/24/21 0711  Catheter Maintenance Drainage bag/tubing not touching floor;Bag below level of bladder;Insertion date on drainage bag;No dependent loops;Catheter secured;Seal intact 02/24/21 0711  Collection Container Standard drainage bag 02/24/21 0711  Securement Method Securing device (Describe) 02/24/21 0711  Urinary Catheter Interventions (if applicable) Unclamped 02/24/21 0711  Output (mL) 200 mL 02/24/21 0600    Microbiology/Sepsis markers: Results for orders placed or performed during the hospital encounter of 02/15/21  Resp Panel by RT-PCR (Flu A&B, Covid) Nasopharyngeal Swab     Status: None   Collection Time: 02/15/21  8:14 PM   Specimen: Nasopharyngeal Swab; Nasopharyngeal(NP) swabs in vial transport medium  Result Value Ref Range Status   SARS Coronavirus 2 by RT PCR NEGATIVE NEGATIVE Final    Comment: (NOTE) SARS-CoV-2 target nucleic acids are NOT DETECTED.  The SARS-CoV-2 RNA is generally detectable in upper respiratory specimens during the acute phase of infection. The lowest concentration of SARS-CoV-2 viral copies this assay can detect is 138 copies/mL. A negative result does not preclude SARS-Cov-2 infection and should not be used as the sole basis for treatment or other patient management decisions. A negative result may occur with  improper specimen collection/handling, submission of specimen other than nasopharyngeal  swab, presence of viral mutation(s) within  the areas targeted by this assay, and inadequate number of viral copies(<138 copies/mL). A negative result must be combined with clinical observations, patient history, and epidemiological information. The expected result is Negative.  Fact Sheet for Patients:  BloggerCourse.com  Fact Sheet for Healthcare Providers:  SeriousBroker.it  This test is no t yet approved or cleared by the [place] FDA and  has been authorized for detection and/or diagnosis of SARS-CoV-2 by FDA under an Emergency Use Authorization (EUA). This EUA will remain  in effect (meaning this test can be used) for the duration of the COVID-19 declaration under Section 564(b)(1) of the Act, 21 U.S.C.section 360bbb-3(b)(1), unless the authorization is terminated  or revoked sooner.       Influenza A by PCR NEGATIVE NEGATIVE Final   Influenza B by PCR NEGATIVE NEGATIVE Final    Comment: (NOTE) The Xpert Xpress SARS-CoV-2/FLU/RSV plus assay is intended as an aid in the diagnosis of influenza from Nasopharyngeal swab specimens and should not be used as a sole basis for treatment. Nasal washings and aspirates are unacceptable for Xpert Xpress SARS-CoV-2/FLU/RSV testing.  Fact Sheet for Patients: BloggerCourse.com  Fact Sheet for Healthcare Providers: SeriousBroker.it  This test is not yet approved or cleared by the [place] FDA and has been authorized for detection and/or diagnosis of SARS-CoV-2 by FDA under an Emergency Use Authorization (EUA). This EUA will remain in effect (meaning this test can be used) for the duration of the COVID-19 declaration under Section 564(b)(1) of the Act, 21 U.S.C. section 360bbb-3(b)(1), unless the authorization is terminated or revoked.  Performed at [facility], [address], [place], [place] [address]   MRSA Next Gen by PCR, Nasal     Status: None    Collection Time: 02/15/21  9:58 PM   Specimen: Nasal Mucosa; Nasal Swab  Result Value Ref Range Status   MRSA by PCR Next Gen NOT DETECTED NOT DETECTED Final    Comment: (NOTE) The GeneXpert MRSA Assay (FDA approved for NASAL specimens only), is one component of a comprehensive MRSA colonization surveillance program. It is not intended to diagnose MRSA infection nor to guide or monitor treatment for MRSA infections. Test performance is not FDA approved in patients less than 44 years old. Performed at [facility], [address], [place], [place] [address]   Culture, Respiratory w Gram Stain     Status: None   Collection Time: 02/17/21  2:04 PM   Specimen: Tracheal Aspirate; Respiratory  Result Value Ref Range Status   Specimen Description TRACHEAL ASPIRATE  Final   Special Requests NONE  Final   Gram Stain   Final    ABUNDANT WBC PRESENT,BOTH PMN AND MONONUCLEAR ABUNDANT GRAM NEGATIVE RODS MODERATE GRAM POSITIVE COCCI RARE GRAM POSITIVE RODS    Culture   Final    FEW Normal respiratory flora-no Staph aureus or Pseudomonas seen Performed at [facility], [address], [place], [place] [address]    Report Status 02/19/2021 FINAL  Final  Culture, blood (routine x 2)     Status: None   Collection Time: 02/17/21  2:24 PM   Specimen: BLOOD  Result Value Ref Range Status   Specimen Description BLOOD BLOOD RIGHT FOREARM  Final   Special Requests   Final    BOTTLES DRAWN AEROBIC AND ANAEROBIC Blood Culture adequate volume   Culture   Final    NO GROWTH 5 DAYS Performed at [facility]  Lab, [address], [place], [place] [address]    Report Status 02/22/2021 FINAL  Final  Culture, blood (routine x 2)     Status: None   Collection Time: 02/17/21  3:01 PM   Specimen: BLOOD  Result Value Ref Range Status   Specimen Description BLOOD LEFT ANTECUBITAL  Final   Special Requests   Final    BOTTLES DRAWN AEROBIC AND ANAEROBIC Blood Culture adequate volume   Culture    Final    NO GROWTH 5 DAYS Performed at [facility], [address], [place], [place] [address]    Report Status 02/22/2021 FINAL  Final  Culture, Respiratory w Gram Stain     Status: None   Collection Time: 02/20/21 12:49 PM   Specimen: Tracheal Aspirate; Respiratory  Result Value Ref Range Status   Specimen Description TRACHEAL ASPIRATE  Final   Special Requests NONE  Final   Gram Stain   Final    MODERATE WBC PRESENT,BOTH PMN AND MONONUCLEAR ABUNDANT GRAM POSITIVE COCCI IN PAIRS ABUNDANT GRAM NEGATIVE RODS Performed at [facility], [address] [place], [place] [address]    Culture   Final    ABUNDANT METHICILLIN RESISTANT STAPHYLOCOCCUS AUREUS ABUNDANT PSEUDOMONAS AERUGINOSA    Report Status 02/23/2021 FINAL  Final   Organism ID, Bacteria METHICILLIN RESISTANT STAPHYLOCOCCUS AUREUS  Final   Organism ID, Bacteria PSEUDOMONAS AERUGINOSA  Final      Susceptibility   Methicillin resistant staphylococcus aureus - MIC*    CIPROFLOXACIN <=0.5 SENSITIVE Sensitive     ERYTHROMYCIN <=0.25 SENSITIVE Sensitive     GENTAMICIN <=0.5 SENSITIVE Sensitive     OXACILLIN >=4 RESISTANT Resistant     TETRACYCLINE <=1 SENSITIVE Sensitive     VANCOMYCIN 1 SENSITIVE Sensitive     TRIMETH/SULFA <=10 SENSITIVE Sensitive     CLINDAMYCIN <=0.25 SENSITIVE Sensitive     RIFAMPIN <=0.5 SENSITIVE Sensitive     Inducible Clindamycin NEGATIVE Sensitive     * ABUNDANT METHICILLIN RESISTANT STAPHYLOCOCCUS AUREUS   Pseudomonas aeruginosa - MIC*    CEFTAZIDIME 4 SENSITIVE Sensitive     CIPROFLOXACIN 0.5 SENSITIVE Sensitive     GENTAMICIN <=1 SENSITIVE Sensitive     IMIPENEM 2 SENSITIVE Sensitive     PIP/TAZO 8 SENSITIVE Sensitive     CEFEPIME 2 SENSITIVE Sensitive     * ABUNDANT PSEUDOMONAS AERUGINOSA  Culture, Respiratory w Gram Stain     Status: None (Preliminary result)   Collection Time: 02/23/21 10:05 AM   Specimen: Tracheal Aspirate; Respiratory  Result Value Ref Range Status    Specimen Description TRACHEAL ASPIRATE  Final   Special Requests NONE  Final   Gram Stain   Final    ABUNDANT WBC PRESENT, PREDOMINANTLY PMN FEW GRAM NEGATIVE RODS RARE GRAM POSITIVE COCCI Performed at [facility], [address], [place], [place] [address]    Culture PENDING  Incomplete   Report Status PENDING  Incomplete    Anti-infectives:  Anti-infectives (From admission, onward)    Start     Dose/Rate Route Frequency Ordered Stop   02/23/21 0200  vancomycin (VANCOREADY) IVPB 1500 mg/300 mL        1,500 mg 150 mL/hr over 120 Minutes Intravenous Every 8 hours 02/22/21 1931     02/22/21 2100  vancomycin (VANCOCIN) 250 mg in sodium chloride 0.9 % 100 mL IVPB        250 mg 100 mL/hr over 60 Minutes Intravenous  Once 02/22/21 2012 02/22/21 2150  02/22/21 2030  vancomycin (VANCOCIN) 250 mg in sodium chloride 0.9 % 500 mL IVPB  Status:  Discontinued        250 mg 250 mL/hr over 120 Minutes Intravenous  Once 02/22/21 1931 02/22/21 2012   02/21/21 1800  vancomycin (VANCOREADY) IVPB 1250 mg/250 mL  Status:  Discontinued        1,250 mg 166.7 mL/hr over 90 Minutes Intravenous Every 8 hours 02/21/21 0851 02/22/21 1931   02/21/21 1000  ceFEPIme (MAXIPIME) 2 g in sodium chloride 0.9 % 100 mL IVPB        2 g 200 mL/hr over 30 Minutes Intravenous Every 8 hours 02/21/21 0851     02/21/21 1000  vancomycin (VANCOREADY) IVPB 2000 mg/400 mL        2,000 mg 200 mL/hr over 120 Minutes Intravenous  Once 02/21/21 0851 02/21/21 1306       Best Practice/Protocols:  VTE Prophylaxis: Lovenox (prophylaxtic dose) Continous Sedation  Consults: Treatment Team:  Serena Colonel, MD Lisbeth Renshaw, MD Myrene Galas, MD Julio Sicks, MD    Studies:    Events:  Subjective:    Overnight Issues:   Objective:  Vital signs for last 24 hours: Temp:  [100.2 F (37.9 C)-102.5 F (39.2 C)] 100.7 F (38.2 C) (08/18 0400) Pulse Rate:  [113-138] 116 (08/18 0800) Resp:  [0-28] 28  (08/18 0800) BP: (110-151)/(7-82) 136/76 (08/18 0800) SpO2:  [76 %-94 %] 90 % (08/18 0800) Arterial Line BP: (104-151)/(51-72) 125/64 (08/18 0800) FiO2 (%):  [60 %] 60 % (08/18 0352) Weight:  [92.3 kg] 92.3 kg (08/18 0449)  Hemodynamic parameters for last 24 hours:    Intake/Output from previous day: 08/17 0701 - 08/18 0700 In: 3087.6 [I.V.:1662.7; IV Piggyback:1424.9] Out: 2057 [Urine:2017; Emesis/NG output:40]  Intake/Output this shift: No intake/output data recorded.  Vent settings for last 24 hours: Vent Mode: PRVC FiO2 (%):  [60 %] 60 % Set Rate:  [28 bmp] 28 bmp Vt Set:  [30 mL-530 mL] 530 mL PEEP:  [16 cmH20] 16 cmH20 Plateau Pressure:  [32 cmH20-40 cmH20] 37 cmH20  Physical Exam:  General: on vent Neuro: paralytic, pupils 2mm HEENT/Neck: ETT Resp: some rhonchi B CVS: RRR GI: distended but a bit softer Extremities: edema 2+  Results for orders placed or performed during the hospital encounter of 02/15/21 (from the past 24 hour(s))  Glucose, capillary     Status: Abnormal   Collection Time: 02/23/21  8:48 AM  Result Value Ref Range   Glucose-Capillary 108 (H) 70 - 99 mg/dL  Culture, Respiratory w Gram Stain     Status: None (Preliminary result)   Collection Time: 02/23/21 10:05 AM   Specimen: Tracheal Aspirate; Respiratory  Result Value Ref Range   Specimen Description TRACHEAL ASPIRATE    Special Requests NONE    Gram Stain      ABUNDANT WBC PRESENT, PREDOMINANTLY PMN FEW GRAM NEGATIVE RODS RARE GRAM POSITIVE COCCI Performed at [facility], [address] [place], [place] [address]    Culture PENDING    Report Status PENDING   I-STAT 7, (LYTES, BLD GAS, ICA, H+H)     Status: Abnormal   Collection Time: 02/23/21 10:14 AM  Result Value Ref Range   pH, Arterial 7.301 (L) 7.350 - 7.450   pCO2 arterial 61.9 (H) 32.0 - 48.0 mmHg   pO2, Arterial 79 (L) 83.0 - 108.0 mmHg   Bicarbonate 29.9 (H) 20.0 - 28.0 mmol/L   TCO2 32 22 - 32 mmol/L   O2  Saturation 92.0 %   Acid-Base Excess 3.0 (H) 0.0 - 2.0 mmol/L   Sodium 146 (H) 135 - 145 mmol/L   Potassium 4.0 3.5 - 5.1 mmol/L   Calcium, Ion 1.26 1.15 - 1.40 mmol/L   HCT 24.0 (L) 39.0 - 52.0 %   Hemoglobin 8.2 (L) 13.0 - 17.0 g/dL   Patient temperature 161.0102.1 F    Collection site Web designerart line    Drawn by Operator    Sample type ARTERIAL   Glucose, capillary     Status: Abnormal   Collection Time: 02/23/21 11:41 AM  Result Value Ref Range   Glucose-Capillary 106 (H) 70 - 99 mg/dL  Glucose, capillary     Status: Abnormal   Collection Time: 02/23/21  3:53 PM  Result Value Ref Range   Glucose-Capillary 112 (H) 70 - 99 mg/dL  Glucose, capillary     Status: Abnormal   Collection Time: 02/23/21  7:41 PM  Result Value Ref Range   Glucose-Capillary 115 (H) 70 - 99 mg/dL  Glucose, capillary     Status: Abnormal   Collection Time: 02/23/21 11:50 PM  Result Value Ref Range   Glucose-Capillary 112 (H) 70 - 99 mg/dL  I-STAT 7, (LYTES, BLD GAS, ICA, H+H)     Status: Abnormal   Collection Time: 02/24/21  4:09 AM  Result Value Ref Range   pH, Arterial 7.316 (L) 7.350 - 7.450   pCO2 arterial 57.8 (H) 32.0 - 48.0 mmHg   pO2, Arterial 87 83.0 - 108.0 mmHg   Bicarbonate 29.3 (H) 20.0 - 28.0 mmol/L   TCO2 31 22 - 32 mmol/L   O2 Saturation 95.0 %   Acid-Base Excess 3.0 (H) 0.0 - 2.0 mmol/L   Sodium 149 (H) 135 - 145 mmol/L   Potassium 3.3 (L) 3.5 - 5.1 mmol/L   Calcium, Ion 1.28 1.15 - 1.40 mmol/L   HCT 28.0 (L) 39.0 - 52.0 %   Hemoglobin 9.5 (L) 13.0 - 17.0 g/dL   Patient temperature 960.4100.2 F    Collection site Radial    Drawn by VP    Sample type ARTERIAL   Vancomycin, peak     Status: None   Collection Time: 02/24/21  4:45 AM  Result Value Ref Range   Vancomycin Pk 39 30 - 40 ug/mL  CBC     Status: Abnormal   Collection Time: 02/24/21  4:45 AM  Result Value Ref Range   WBC 25.4 (H) 4.0 - 10.5 K/uL   RBC 2.57 (L) 4.22 - 5.81 MIL/uL   Hemoglobin 8.2 (L) 13.0 - 17.0 g/dL   HCT 54.025.5 (L)  98.139.0 - 52.0 %   MCV 99.2 80.0 - 100.0 fL   MCH 31.9 26.0 - 34.0 pg   MCHC 32.2 30.0 - 36.0 g/dL   RDW 19.116.2 (H) 47.811.5 - 29.515.5 %   Platelets 328 150 - 400 K/uL   nRBC 0.2 0.0 - 0.2 %  Magnesium     Status: Abnormal   Collection Time: 02/24/21  4:45 AM  Result Value Ref Range   Magnesium 2.5 (H) 1.7 - 2.4 mg/dL  Phosphorus     Status: Abnormal   Collection Time: 02/24/21  4:45 AM  Result Value Ref Range   Phosphorus 2.2 (L) 2.5 - 4.6 mg/dL  Basic metabolic panel     Status: Abnormal   Collection Time: 02/24/21  4:45 AM  Result Value Ref Range   Sodium 147 (H) 135 - 145 mmol/L   Potassium 3.1 (L) 3.5 -  5.1 mmol/L   Chloride 107 98 - 111 mmol/L   CO2 27 22 - 32 mmol/L   Glucose, Bld 111 (H) 70 - 99 mg/dL   BUN 20 6 - 20 mg/dL   Creatinine, Ser 3.23 0.61 - 1.24 mg/dL   Calcium 9.2 8.9 - 55.7 mg/dL   GFR, Estimated >32 >20 mL/min   Anion gap 13 5 - 15  Glucose, capillary     Status: Abnormal   Collection Time: 02/24/21  4:52 AM  Result Value Ref Range   Glucose-Capillary 113 (H) 70 - 99 mg/dL  Glucose, capillary     Status: Abnormal   Collection Time: 02/24/21  7:13 AM  Result Value Ref Range   Glucose-Capillary 136 (H) 70 - 99 mg/dL    Assessment & Plan: Present on Admission:  Traumatic brain injury (HCC)    LOS: 9 days   Additional comments:I reviewed the patient's new clinical lab test results. Marland Kitchen [facility]   SAH, 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 Conchita Paris, keppra x7d for sz ppx G1 L ICA injury - ASA 325 Facial laceration involving left eyelid - ENT c/s, Dr. Pollyann Kennedy, repaired 8/9 Skull fx extending ito L TMJ, L sphenoid sinus, R mastoid; R nasal bone fx - ENT c/s, Dr. Pollyann Kennedy, repeat exam when more neurologically appropriate VDRF - see below Severe ARDS - sat goal 88%, 60% and PEEP 16, try to stop paralytic today and wean PEEP as able ID/PNA - cont vanc/cefepime for MRSA and pseud PNA. Remains febrile and WBC 25 so will check blood CX. Repeat resp  CX pending. Monkey pox pending. Hypertensive urgency - improved on esmolol gtt, cont until able to give PO L clavicle and L scapula fx - now non-op per Dr. [name] rash, scattered abrasions - local wound care Alcohol abuse - CIWA, TOC c/s H/o tobacco use disorder  FEN - cortrak, NGT placed 8/15 for distension and output high, continue NGT today, dulcolax supp, lasix x 1, replete hypokalemia and hypophosphatemia DVT - SCDs, LMWH  Foley - placed 8/15 Dispo - ICU. Clinical update provided to sister at bedside today and to his wife on speaker phone. I answered all of their questions and his wife was satisfied at the end of the call. Critical Care Total Time*: 45 Minutes  Violeta Gelinas, MD, MPH, FACS Trauma & General Surgery Use [email or web address] to contact on call provider  02/24/2021  *Care during the described time interval was provided by me. I have reviewed this patient's available data, including medical history, events of note, physical examination and test results as part of my evaluation.

## 2021-02-24 NOTE — Progress Notes (Signed)
Pharmacy Antibiotic Note  Scott [name] is a 35 y.o. male admitted on 02/15/2021 with hospital acquired pneumonia, concern for ARDS, and development of evolving infiltrates on imaging.  Pharmacy was consulted for vancomycin and cefepime dosing.   Patient currently on vanco 1500mg  q8hr Vanco P: 39 mcg/mL (drawn appropriately) Vanco Tr: 25 mcg/mL (drawn 2 hours early) Calculated AUC: 694 Renal function stable  Cultures growing: MRSA and PA  Plan: Will adjust to vancomycin 1000mg  q8hr and retime for later this afternoon to allow for continued elimination (eAUC 462) Plan to obtain vancomycin levels at steady state to monitor for accumulation Continue cefepime 2 gm IV Q 8 hrs Monitor WBC, temp, clinical improvement, cultures, renal function, vancomycin levels F/U culture results and de-escalate as appropriate  Height: 5\' 11"  (180.3 cm) Weight: 92.3 kg (203 lb 7.8 oz) IBW/kg (Calculated) : 75.3  Temp (24hrs), Avg:101 F (38.3 C), Min:100.2 F (37.9 C), Max:102.5 F (39.2 C)  Recent Labs  Lab 02/20/21 0200 02/21/21 0640 02/22/21 0529 02/22/21 1432 02/22/21 1726 02/23/21 0634 02/24/21 0445 02/24/21 0714  WBC 11.2* 12.3* 10.4  --   --  20.5* 25.4*  --   CREATININE 0.49* 0.44* 0.59*  --   --  0.77 0.77  --   VANCOTROUGH  --   --   --   --  11*  --   --  25*  VANCOPEAK  --   --   --  21*  --   --  39  --      Estimated Creatinine Clearance: 149.7 mL/min (by C-G formula based on SCr of 0.77 mg/dL).    Allergies  Allergen Reactions   Lactose Intolerance (Gi)     Antimicrobials this admission: Vancomycin 8/15  >>  Cefepime 8/15  >>   Dose adjustments this admission: N/A  Microbiology results: 8/18 Trach aspirate 8/11 Bcx X 2:  NG/final  8/11 Trach aspirate: few normal respiratory flora 8/14 Trach aspirate: MRSA, abundant Pseudomonas aeruginosa,  8/9 MRSA PCR: negative (new infiltrates post PCR) 8/9 COVID, flu A, flu B, HIV screen: negative  Thank you for allowing  pharmacy to be a part of this patient's care. 9/14 Maia Handa 02/24/21 9:40

## 2021-02-24 NOTE — Progress Notes (Signed)
Patient ID: Scott [name], male   DOB: 03-Aug-1985, 35 y.o.   MRN: [id number] I met with his wife at the bedside per her request for further clinical update. I answered all of her questions. Georganna Skeans, MD, MPH, FACS Please use [email or web address] to contact on call provider

## 2021-02-25 ENCOUNTER — Inpatient Hospital Stay (HOSPITAL_COMMUNITY): Payer: 59

## 2021-02-25 DIAGNOSIS — M7989 Other specified soft tissue disorders: Secondary | ICD-10-CM

## 2021-02-25 LAB — POCT I-STAT 7, (LYTES, BLD GAS, ICA,H+H)
Acid-Base Excess: 3 mmol/L — ABNORMAL HIGH (ref 0.0–2.0)
Bicarbonate: 30.5 mmol/L — ABNORMAL HIGH (ref 20.0–28.0)
Calcium, Ion: 1.24 mmol/L (ref 1.15–1.40)
HCT: 30 % — ABNORMAL LOW (ref 39.0–52.0)
Hemoglobin: 10.2 g/dL — ABNORMAL LOW (ref 13.0–17.0)
O2 Saturation: 92 %
Patient temperature: 98.1
Potassium: 3.1 mmol/L — ABNORMAL LOW (ref 3.5–5.1)
Sodium: 152 mmol/L — ABNORMAL HIGH (ref 135–145)
TCO2: 32 mmol/L (ref 22–32)
pCO2 arterial: 59.2 mmHg — ABNORMAL HIGH (ref 32.0–48.0)
pH, Arterial: 7.318 — ABNORMAL LOW (ref 7.350–7.450)
pO2, Arterial: 69 mmHg — ABNORMAL LOW (ref 83.0–108.0)

## 2021-02-25 LAB — CBC
HCT: 25.7 % — ABNORMAL LOW (ref 39.0–52.0)
Hemoglobin: 8 g/dL — ABNORMAL LOW (ref 13.0–17.0)
MCH: 31.3 pg (ref 26.0–34.0)
MCHC: 31.1 g/dL (ref 30.0–36.0)
MCV: 100.4 fL — ABNORMAL HIGH (ref 80.0–100.0)
Platelets: 356 10*3/uL (ref 150–400)
RBC: 2.56 MIL/uL — ABNORMAL LOW (ref 4.22–5.81)
RDW: 16.4 % — ABNORMAL HIGH (ref 11.5–15.5)
WBC: 24.3 10*3/uL — ABNORMAL HIGH (ref 4.0–10.5)
nRBC: 0.2 % (ref 0.0–0.2)

## 2021-02-25 LAB — URINALYSIS, ROUTINE W REFLEX MICROSCOPIC
Bilirubin Urine: NEGATIVE
Glucose, UA: NEGATIVE mg/dL
Hgb urine dipstick: NEGATIVE
Ketones, ur: NEGATIVE mg/dL
Leukocytes,Ua: NEGATIVE
Nitrite: NEGATIVE
Protein, ur: 30 mg/dL — AB
Specific Gravity, Urine: 1.024 (ref 1.005–1.030)
pH: 5 (ref 5.0–8.0)

## 2021-02-25 LAB — BASIC METABOLIC PANEL
Anion gap: 7 (ref 5–15)
BUN: 24 mg/dL — ABNORMAL HIGH (ref 6–20)
CO2: 29 mmol/L (ref 22–32)
Calcium: 8.5 mg/dL — ABNORMAL LOW (ref 8.9–10.3)
Chloride: 113 mmol/L — ABNORMAL HIGH (ref 98–111)
Creatinine, Ser: 0.61 mg/dL (ref 0.61–1.24)
GFR, Estimated: >60 mL/min (ref >60)
Glucose, Bld: 125 mg/dL — ABNORMAL HIGH (ref 70–99)
Potassium: 3.2 mmol/L — ABNORMAL LOW (ref 3.5–5.1)
Sodium: 149 mmol/L — ABNORMAL HIGH (ref 135–145)

## 2021-02-25 LAB — VANCOMYCIN, TROUGH: Vancomycin Tr: 10 ug/mL — ABNORMAL LOW (ref 15–20)

## 2021-02-25 LAB — SARS CORONAVIRUS 2 (TAT 6-24 HRS): SARS Coronavirus 2: NEGATIVE

## 2021-02-25 LAB — GLUCOSE, CAPILLARY
Glucose-Capillary: 108 mg/dL — ABNORMAL HIGH (ref 70–99)
Glucose-Capillary: 115 mg/dL — ABNORMAL HIGH (ref 70–99)
Glucose-Capillary: 118 mg/dL — ABNORMAL HIGH (ref 70–99)
Glucose-Capillary: 120 mg/dL — ABNORMAL HIGH (ref 70–99)
Glucose-Capillary: 125 mg/dL — ABNORMAL HIGH (ref 70–99)
Glucose-Capillary: 133 mg/dL — ABNORMAL HIGH (ref 70–99)
Glucose-Capillary: 145 mg/dL — ABNORMAL HIGH (ref 70–99)

## 2021-02-25 LAB — MAGNESIUM: Magnesium: 2.6 mg/dL — ABNORMAL HIGH (ref 1.7–2.4)

## 2021-02-25 LAB — PHOSPHORUS: Phosphorus: 2.2 mg/dL — ABNORMAL LOW (ref 2.5–4.6)

## 2021-02-25 LAB — TRIGLYCERIDES: Triglycerides: 188 mg/dL — ABNORMAL HIGH (ref <150)

## 2021-02-25 LAB — VANCOMYCIN, PEAK: Vancomycin Pk: 32 ug/mL (ref 30–40)

## 2021-02-25 MED ORDER — FUROSEMIDE 10 MG/ML IJ SOLN
60.0000 mg | Freq: Once | INTRAMUSCULAR | Status: AC
  Administered 2021-02-25: 60 mg via INTRAVENOUS
  Filled 2021-02-25: qty 6

## 2021-02-25 MED ORDER — PIVOT 1.5 CAL PO LIQD
1000.0000 mL | ORAL | Status: DC
  Administered 2021-02-25 – 2021-02-27 (×2): 1000 mL

## 2021-02-25 MED ORDER — POTASSIUM CHLORIDE 10 MEQ/50ML IV SOLN
10.0000 meq | INTRAVENOUS | Status: AC
  Administered 2021-02-25 (×6): 10 meq via INTRAVENOUS
  Filled 2021-02-25 (×6): qty 50

## 2021-02-25 MED ORDER — POTASSIUM PHOSPHATES 15 MMOLE/5ML IV SOLN
30.0000 mmol | Freq: Once | INTRAVENOUS | Status: AC
  Administered 2021-02-25: 30 mmol via INTRAVENOUS
  Filled 2021-02-25: qty 10

## 2021-02-25 NOTE — Progress Notes (Signed)
Pt still unresponsive despite sedation being titrated down, Dr. Bedelia Person made aware.  No new orders at this time.

## 2021-02-25 NOTE — Progress Notes (Signed)
Bilateral lower extremity venous duplex completed. Refer to "CV Proc" under chart review to view preliminary results.  Preliminary results discussed with Joni Reining, RN.  02/25/2021 4:07 PM Eula Fried., MHA, RVT, RDCS, RDMS

## 2021-02-25 NOTE — Progress Notes (Signed)
No significant change in status.  Patient's overall condition is stable.  Remains critically ill with pulmonary failure.  Continue supportive efforts.  No new neurosurgical recommendations.

## 2021-02-25 NOTE — Progress Notes (Signed)
0200- Discovered that patient's monkeypox test was negative, airborne precautions discontinued.

## 2021-02-25 NOTE — Progress Notes (Signed)
R calf DTV per vascular. Dr Bedelia Person notified.

## 2021-02-25 NOTE — Progress Notes (Signed)
Pharmacy Antibiotic Note  Scott [name] is a 35 y.o. male admitted on 02/15/2021 with hospital acquired pneumonia, concern for ARDS, and development of evolving infiltrates on imaging.  Pharmacy was consulted for vancomycin and cefepime dosing.   Patient currently on vanco 1000 mg IV q8h Vanco P: 32 mcg/mL (drawn appropriately) Vanco Tr: 10 mcg/mL (drawn appropriately) Calculated AUC: 535.8 which is therapeutic Renal function stable  Plan: Continue vancomycin 1000 mg IV q8h Goal AUC 400-550 Continue cefepime 2 gm IV q8h Monitor WBC, temp, clinical improvement, cultures, renal function, vancomycin levels F/U culture results and de-escalate as appropriate  Height: 5\' 11"  (180.3 cm) Weight: 92.1 kg (203 lb 0.7 oz) IBW/kg (Calculated) : 75.3  Temp (24hrs), Avg:98.9 F (37.2 C), Min:97.3 F (36.3 C), Max:99.8 F (37.7 C)  Recent Labs  Lab 02/21/21 0640 02/22/21 0529 02/22/21 1432 02/23/21 0634 02/24/21 0445 02/24/21 0714 02/25/21 0607 02/25/21 1400 02/25/21 1936  WBC 12.3* 10.4  --  20.5* 25.4*  --  24.3*  --   --   CREATININE 0.44* 0.59*  --  0.77 0.77  --  0.61  --   --   VANCOTROUGH  --   --    < >  --   --  25*  --   --  10*  VANCOPEAK  --   --    < >  --  39  --   --  32  --    < > = values in this interval not displayed.     Estimated Creatinine Clearance: 149.5 mL/min (by C-G formula based on SCr of 0.61 mg/dL).    Allergies  Allergen Reactions   Lactose Intolerance (Gi)     Antimicrobials this admission: Vancomycin 8/15  >>  Cefepime 8/15  >>   Dose adjustments this admission: N/A  Microbiology results: 8/18 Bcx: ngtd 8/17 Trach aspirate: Gram stain + GPC, GNR 8/11 Bcx X 2:  NG/final  8/11 Trach aspirate: few normal respiratory flora 8/14 Trach aspirate: MRSA, abundant Pseudomonas aeruginosa 8/9 MRSA PCR: negative (new infiltrates post PCR) 8/9 COVID, flu A, flu B, HIV screen: negative  Thank you for involving pharmacy in this patient's  care.  10/9, PharmD, BCPS Clinical Pharmacist Clinical phone for 02/25/2021 until 10p is [phone] 02/25/2021 9:04 PM  **Pharmacist phone directory can be found on [email or web address] listed under [facility]**

## 2021-02-25 NOTE — Progress Notes (Signed)
Trauma/Critical Care Follow Up Note  Subjective:    Overnight Issues:   Objective:  Vital signs for last 24 hours: Temp:  [97.3 F (36.3 C)-102.2 F (39 C)] 99.8 F (37.7 C) (08/19 0800) Pulse Rate:  [89-126] 109 (08/19 0800) Resp:  [18-32] 28 (08/19 0800) BP: (122-157)/(63-86) 157/86 (08/19 0800) SpO2:  [87 %-99 %] 94 % (08/19 0800) Arterial Line BP: (103-175)/(51-92) 160/76 (08/19 0800) FiO2 (%):  [40 %-60 %] 40 % (08/19 0800) Weight:  [92.1 kg] 92.1 kg (08/19 0500)  Hemodynamic parameters for last 24 hours:    Intake/Output from previous day: 08/18 0701 - 08/19 0700 In: 4028.2 [I.V.:2728.3; IV Piggyback:1300] Out: 3245 [Urine:3245]  Intake/Output this shift: Total I/O In: 105.7 [I.V.:105.7] Out: 200 [Urine:200]  Vent settings for last 24 hours: Vent Mode: PRVC FiO2 (%):  [40 %-60 %] 40 % Set Rate:  [28 bmp] 28 bmp Vt Set:  [30 mL-530 mL] 530 mL PEEP:  [14 cmH20-16 cmH20] 14 cmH20 Plateau Pressure:  [28 cmH20-37 cmH20] 28 cmH20  Physical Exam:  Gen: comfortable, no distress Neuro: not f/c HEENT: PERRL Neck: supple CV: RRR Pulm: unlabored breathing on MV Abd: soft, distended, +BM GU: clear yellow urine Extr: wwp, 1+ edema   Results for orders placed or performed during the hospital encounter of 02/15/21 (from the past 24 hour(s))  Glucose, capillary     Status: Abnormal   Collection Time: 02/24/21 12:14 PM  Result Value Ref Range   Glucose-Capillary 153 (H) 70 - 99 mg/dL  Glucose, capillary     Status: Abnormal   Collection Time: 02/24/21  3:47 PM  Result Value Ref Range   Glucose-Capillary 112 (H) 70 - 99 mg/dL  Glucose, capillary     Status: Abnormal   Collection Time: 02/24/21  7:26 PM  Result Value Ref Range   Glucose-Capillary 115 (H) 70 - 99 mg/dL  Glucose, capillary     Status: Abnormal   Collection Time: 02/25/21 12:37 AM  Result Value Ref Range   Glucose-Capillary 120 (H) 70 - 99 mg/dL  Glucose, capillary     Status: Abnormal    Collection Time: 02/25/21  3:50 AM  Result Value Ref Range   Glucose-Capillary 118 (H) 70 - 99 mg/dL  I-STAT 7, (LYTES, BLD GAS, ICA, H+H)     Status: Abnormal   Collection Time: 02/25/21  3:55 AM  Result Value Ref Range   pH, Arterial 7.318 (L) 7.350 - 7.450   pCO2 arterial 59.2 (H) 32.0 - 48.0 mmHg   pO2, Arterial 69 (L) 83.0 - 108.0 mmHg   Bicarbonate 30.5 (H) 20.0 - 28.0 mmol/L   TCO2 32 22 - 32 mmol/L   O2 Saturation 92.0 %   Acid-Base Excess 3.0 (H) 0.0 - 2.0 mmol/L   Sodium 152 (H) 135 - 145 mmol/L   Potassium 3.1 (L) 3.5 - 5.1 mmol/L   Calcium, Ion 1.24 1.15 - 1.40 mmol/L   HCT 30.0 (L) 39.0 - 52.0 %   Hemoglobin 10.2 (L) 13.0 - 17.0 g/dL   Patient temperature 19.3 F    Collection site Radial    Drawn by VP    Sample type ARTERIAL   Triglycerides     Status: Abnormal   Collection Time: 02/25/21  6:07 AM  Result Value Ref Range   Triglycerides 188 (H) <150 mg/dL  CBC     Status: Abnormal   Collection Time: 02/25/21  6:07 AM  Result Value Ref Range   WBC 24.3 (H) 4.0 - 10.5  K/uL   RBC 2.56 (L) 4.22 - 5.81 MIL/uL   Hemoglobin 8.0 (L) 13.0 - 17.0 g/dL   HCT 66.4 (L) 40.3 - 47.4 %   MCV 100.4 (H) 80.0 - 100.0 fL   MCH 31.3 26.0 - 34.0 pg   MCHC 31.1 30.0 - 36.0 g/dL   RDW 25.9 (H) 56.3 - 87.5 %   Platelets 356 150 - 400 K/uL   nRBC 0.2 0.0 - 0.2 %  Magnesium     Status: Abnormal   Collection Time: 02/25/21  6:07 AM  Result Value Ref Range   Magnesium 2.6 (H) 1.7 - 2.4 mg/dL  Phosphorus     Status: Abnormal   Collection Time: 02/25/21  6:07 AM  Result Value Ref Range   Phosphorus 2.2 (L) 2.5 - 4.6 mg/dL  Basic metabolic panel     Status: Abnormal   Collection Time: 02/25/21  6:07 AM  Result Value Ref Range   Sodium 149 (H) 135 - 145 mmol/L   Potassium 3.2 (L) 3.5 - 5.1 mmol/L   Chloride 113 (H) 98 - 111 mmol/L   CO2 29 22 - 32 mmol/L   Glucose, Bld 125 (H) 70 - 99 mg/dL   BUN 24 (H) 6 - 20 mg/dL   Creatinine, Ser 6.43 0.61 - 1.24 mg/dL   Calcium 8.5 (L) 8.9  - 10.3 mg/dL   GFR, Estimated >32 >95 mL/min   Anion gap 7 5 - 15  Glucose, capillary     Status: Abnormal   Collection Time: 02/25/21  8:01 AM  Result Value Ref Range   Glucose-Capillary 108 (H) 70 - 99 mg/dL    Assessment & Plan: The plan of care was discussed with the bedside nurse for the day, who is in agreement with this plan (lasix, K, wake-up assessment, wean peep) and no additional concerns were raised.   Present on Admission:  Traumatic brain injury (HCC)    LOS: 10 days   Additional comments:I reviewed the patient's new clinical lab test results.   and I reviewed the patients new imaging test results.    [facility]   SAH, 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 Conchita Paris, keppra x7d for sz ppx G1 L ICA injury - ASA 325 Facial laceration involving left eyelid - ENT c/s, Dr. Pollyann Kennedy, repaired 8/9 Skull fx extending ito L TMJ, L sphenoid sinus, R mastoid; R nasal bone fx - ENT c/s, Dr. Pollyann Kennedy, repeat exam when more neurologically appropriate VDRF - see below Severe ARDS - sat goal 88%, 40% and PEEP 16, paralytic off 8/18, wean PEEP as able, no lower than 12 today ID/PNA - cont vanc/cefepime day 5 for MRSA and pseud PNA. Secretions improved but Tmax 102.2 and WBC 24. Bcx send 8/18, NGTD, repeat resp cx with same orgs as initial. Monkey pox negative (lesions R forearm). Resend COVID swab, send UA, and check venous duplex.  Hypertensive urgency - improved on esmolol gtt, cont until able to give PO L clavicle and L scapula fx - now non-op per Dr. [name] rash, scattered abrasions - local wound care Alcohol abuse - CIWA, TOC c/s H/o tobacco use disorder  FEN - cortrak, NGT placed 8/15 for distension and output high, continue NGT today, start trickle TF, dulcolax supp, lasix x1, replete hypokalemia and hypophosphatemia DVT - SCDs, LMWH  Foley - placed 8/15, voiding trial today Dispo - ICU. Clinical update provided to patient's significant other via phone. All  questions answered.  Critical Care Total  Time: 60 minutes  Diamantina Monks, MD Trauma & General Surgery Please use [email or web address] to contact on call provider  02/25/2021  *Care during the described time interval was provided by me. I have reviewed this patient's available data, including medical history, events of note, physical examination and test results as part of my evaluation.

## 2021-02-26 ENCOUNTER — Inpatient Hospital Stay (HOSPITAL_COMMUNITY): Payer: 59

## 2021-02-26 LAB — POCT I-STAT 7, (LYTES, BLD GAS, ICA,H+H)
Acid-Base Excess: 7 mmol/L — ABNORMAL HIGH (ref 0.0–2.0)
Bicarbonate: 33.8 mmol/L — ABNORMAL HIGH (ref 20.0–28.0)
Calcium, Ion: 1.21 mmol/L (ref 1.15–1.40)
HCT: 29 % — ABNORMAL LOW (ref 39.0–52.0)
Hemoglobin: 9.9 g/dL — ABNORMAL LOW (ref 13.0–17.0)
O2 Saturation: 94 %
Patient temperature: 99.7
Potassium: 3.9 mmol/L (ref 3.5–5.1)
Sodium: 151 mmol/L — ABNORMAL HIGH (ref 135–145)
TCO2: 36 mmol/L — ABNORMAL HIGH (ref 22–32)
pCO2 arterial: 60.7 mmHg — ABNORMAL HIGH (ref 32.0–48.0)
pH, Arterial: 7.356 (ref 7.350–7.450)
pO2, Arterial: 78 mmHg — ABNORMAL LOW (ref 83.0–108.0)

## 2021-02-26 LAB — CBC
HCT: 26.9 % — ABNORMAL LOW (ref 39.0–52.0)
Hemoglobin: 8.4 g/dL — ABNORMAL LOW (ref 13.0–17.0)
MCH: 31 pg (ref 26.0–34.0)
MCHC: 31.2 g/dL (ref 30.0–36.0)
MCV: 99.3 fL (ref 80.0–100.0)
Platelets: 427 10*3/uL — ABNORMAL HIGH (ref 150–400)
RBC: 2.71 MIL/uL — ABNORMAL LOW (ref 4.22–5.81)
RDW: 16.1 % — ABNORMAL HIGH (ref 11.5–15.5)
WBC: 18.6 10*3/uL — ABNORMAL HIGH (ref 4.0–10.5)
nRBC: 0.4 % — ABNORMAL HIGH (ref 0.0–0.2)

## 2021-02-26 LAB — BASIC METABOLIC PANEL
Anion gap: 5 (ref 5–15)
BUN: 18 mg/dL (ref 6–20)
CO2: 31 mmol/L (ref 22–32)
Calcium: 8.1 mg/dL — ABNORMAL LOW (ref 8.9–10.3)
Chloride: 112 mmol/L — ABNORMAL HIGH (ref 98–111)
Creatinine, Ser: 0.5 mg/dL — ABNORMAL LOW (ref 0.61–1.24)
GFR, Estimated: >60 mL/min (ref >60)
Glucose, Bld: 141 mg/dL — ABNORMAL HIGH (ref 70–99)
Potassium: 4 mmol/L (ref 3.5–5.1)
Sodium: 148 mmol/L — ABNORMAL HIGH (ref 135–145)

## 2021-02-26 LAB — GLUCOSE, CAPILLARY
Glucose-Capillary: 126 mg/dL — ABNORMAL HIGH (ref 70–99)
Glucose-Capillary: 141 mg/dL — ABNORMAL HIGH (ref 70–99)
Glucose-Capillary: 120 mg/dL — ABNORMAL HIGH (ref 70–99)
Glucose-Capillary: 114 mg/dL — ABNORMAL HIGH (ref 70–99)
Glucose-Capillary: 144 mg/dL — ABNORMAL HIGH (ref 70–99)
Glucose-Capillary: 135 mg/dL — ABNORMAL HIGH (ref 70–99)

## 2021-02-26 LAB — HEPARIN LEVEL (UNFRACTIONATED): Heparin Unfractionated: <0.1 [IU]/mL — ABNORMAL LOW (ref 0.30–0.70)

## 2021-02-26 LAB — CULTURE, RESPIRATORY W GRAM STAIN

## 2021-02-26 MED ORDER — NITROGLYCERIN IN D5W 200-5 MCG/ML-% IV SOLN
0.0000 ug/min | INTRAVENOUS | Status: DC
  Administered 2021-02-26: 5 ug/min via INTRAVENOUS
  Administered 2021-02-27: 60 ug/min via INTRAVENOUS
  Filled 2021-02-26 (×3): qty 250

## 2021-02-26 MED ORDER — HEPARIN (PORCINE) 25000 UT/250ML-% IV SOLN
2300.0000 [IU]/h | INTRAVENOUS | Status: DC
  Administered 2021-02-26: 1400 [IU]/h via INTRAVENOUS
  Administered 2021-02-27: 1850 [IU]/h via INTRAVENOUS
  Administered 2021-02-27: 2150 [IU]/h via INTRAVENOUS
  Filled 2021-02-26 (×3): qty 250
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
## 2021-02-26 NOTE — Progress Notes (Signed)
ANTICOAGULATION CONSULT NOTE - Initial Consult  Pharmacy Consult for heparin Indication: DVT  Allergies  Allergen Reactions   Lactose Intolerance (Gi)     Patient Measurements: Height: 5\' 11"  (180.3 cm) Weight: 92.5 kg (203 lb 14.8 oz) IBW/kg (Calculated) : 75.3 Heparin Dosing Weight: TBW  Vital Signs: Temp: 100.5 F (38.1 C) (08/20 0800) Temp Source: Oral (08/20 0800) BP: 209/103 (08/20 1000) Pulse Rate: 100 (08/20 1000)  Labs: Recent Labs    02/24/21 0445 02/25/21 0355 02/25/21 0607 02/26/21 0413 02/26/21 0534  HGB 8.2*   < > 8.0* 9.9* 8.4*  HCT 25.5*   < > 25.7* 29.0* 26.9*  PLT 328  --  356  --  427*  CREATININE 0.77  --  0.61  --  0.50*   < > = values in this interval not displayed.    Estimated Creatinine Clearance: 149.8 mL/min (A) (by C-G formula based on SCr of 0.5 mg/dL (L)).   Medical History: No past medical history on file.   Assessment: 65 YOM presenting with TBI/SAH, now with acute R-calf DVT.  Trauma OK with heparin gtt s/p new head CT results, will target low end goals with no bolus.  Lovenox 30mg  SQ given for DVT ppx this AM @340   Goal of Therapy:  Heparin level 0.3-0.5 units/ml Monitor platelets by anticoagulation protocol: Yes   Plan:  Heparin gtt at 1400 units/hr, no bolus F/u 6 hour heparin level F/u long term AC plan  31, PharmD Clinical Pharmacist Please check AMION for all [facility] Pharmacy numbers 02/26/2021 11:15 AM

## 2021-02-26 NOTE — Progress Notes (Signed)
ANTICOAGULATION CONSULT NOTE - Follow Up Consult  Pharmacy Consult for Heparin Indication: DVT  Allergies  Allergen Reactions   Lactose Intolerance (Gi)     Patient Measurements: Height: 5\' 11"  (180.3 cm) Weight: 92.5 kg (203 lb 14.8 oz) IBW/kg (Calculated) : 75.3 Heparin Dosing Weight: 92.5 kg  Vital Signs: Temp: 100.2 F (37.9 C) (08/20 1600) Temp Source: Oral (08/20 1600) BP: 156/88 (08/20 1800) Pulse Rate: 102 (08/20 1800)  Labs: Recent Labs    02/24/21 0445 02/25/21 0355 02/25/21 0607 02/26/21 0413 02/26/21 0534 02/26/21 1744  HGB 8.2*   < > 8.0* 9.9* 8.4*  --   HCT 25.5*   < > 25.7* 29.0* 26.9*  --   PLT 328  --  356  --  427*  --   HEPARINUNFRC  --   --   --   --   --  <0.10*  CREATININE 0.77  --  0.61  --  0.50*  --    < > = values in this interval not displayed.    Estimated Creatinine Clearance: 149.8 mL/min (A) (by C-G formula based on SCr of 0.5 mg/dL (L)).   Medications:  Infusions:   sodium chloride 50 mL/hr at 02/26/21 1800   sodium chloride Stopped (02/25/21 1304)   ceFEPime (MAXIPIME) IV 200 mL/hr at 02/26/21 1800   esmolol 300 mcg/kg/min (02/26/21 1800)   heparin 1,400 Units/hr (02/26/21 1800)   HYDROmorphone 3 mg/hr (02/26/21 1800)   levETIRAcetam Stopped (02/26/21 0905)   midazolam 4 mg/hr (02/26/21 1800)   nitroGLYCERIN 40 mcg/min (02/26/21 1800)   propofol (DIPRIVAN) infusion Stopped (02/25/21 0541)   vancomycin Stopped (02/26/21 1315)    Assessment: 34 YOM presenting with TBI/SAH, now with acute R-calf DVT.  Trauma OK with heparin gtt s/p new head CT results of  hemorrhagic contusion of peripheral right temporal lobe and small overlying subdural hematomas.  Targeting low end of goal due to CT results above.  Hgb low-stable at 8.4. Platelets elevated at 427.  Initial heparin level undetectable at <0.1.   Goal of Therapy:  Heparin level 0.3 to 0.5  units/ml Monitor platelets by anticoagulation protocol: Yes   Plan:  Increase  heparin to 1650 units/hr No bolus due to CT results above Heparin level in 6 hours Daily heparin level and CBC  02/28/21, PharmD, BCPS, BCCCP Clinical Pharmacist Please refer to [facility] for [facility] Pharm[facility] numbers 02/26/2021,6:45 PM
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
## 2021-02-26 NOTE — Progress Notes (Signed)
Pt transported to CT and back to 4N 21 on full vent support. No complications noted.

## 2021-02-27 ENCOUNTER — Inpatient Hospital Stay (HOSPITAL_COMMUNITY): Payer: 59

## 2021-02-27 LAB — BASIC METABOLIC PANEL
Anion gap: 5 (ref 5–15)
BUN: 16 mg/dL (ref 6–20)
CO2: 35 mmol/L — ABNORMAL HIGH (ref 22–32)
Calcium: 8.1 mg/dL — ABNORMAL LOW (ref 8.9–10.3)
Chloride: 109 mmol/L (ref 98–111)
Creatinine, Ser: 0.51 mg/dL — ABNORMAL LOW (ref 0.61–1.24)
GFR, Estimated: >60 mL/min (ref >60)
Glucose, Bld: 170 mg/dL — ABNORMAL HIGH (ref 70–99)
Potassium: 3.7 mmol/L (ref 3.5–5.1)
Sodium: 149 mmol/L — ABNORMAL HIGH (ref 135–145)

## 2021-02-27 LAB — GLUCOSE, CAPILLARY
Glucose-Capillary: 134 mg/dL — ABNORMAL HIGH (ref 70–99)
Glucose-Capillary: 149 mg/dL — ABNORMAL HIGH (ref 70–99)
Glucose-Capillary: 145 mg/dL — ABNORMAL HIGH (ref 70–99)
Glucose-Capillary: 153 mg/dL — ABNORMAL HIGH (ref 70–99)

## 2021-02-27 LAB — HEPARIN LEVEL (UNFRACTIONATED)
Heparin Unfractionated: 0.11 [IU]/mL — ABNORMAL LOW (ref 0.30–0.70)
Heparin Unfractionated: 0.11 [IU]/mL — ABNORMAL LOW (ref 0.30–0.70)
Heparin Unfractionated: 0.12 [IU]/mL — ABNORMAL LOW (ref 0.30–0.70)
Heparin Unfractionated: 0.11 [IU]/mL — ABNORMAL LOW (ref 0.30–0.70)

## 2021-02-27 LAB — CBC
HCT: 25.5 % — ABNORMAL LOW (ref 39.0–52.0)
HCT: 25.9 % — ABNORMAL LOW (ref 39.0–52.0)
Hemoglobin: 7.9 g/dL — ABNORMAL LOW (ref 13.0–17.0)
Hemoglobin: 7.9 g/dL — ABNORMAL LOW (ref 13.0–17.0)
MCH: 31.6 pg (ref 26.0–34.0)
MCH: 31.3 pg (ref 26.0–34.0)
MCHC: 31 g/dL (ref 30.0–36.0)
MCHC: 30.5 g/dL (ref 30.0–36.0)
MCV: 102 fL — ABNORMAL HIGH (ref 80.0–100.0)
MCV: 102.8 fL — ABNORMAL HIGH (ref 80.0–100.0)
Platelets: 447 10*3/uL — ABNORMAL HIGH (ref 150–400)
Platelets: 455 10*3/uL — ABNORMAL HIGH (ref 150–400)
RBC: 2.5 MIL/uL — ABNORMAL LOW (ref 4.22–5.81)
RBC: 2.52 MIL/uL — ABNORMAL LOW (ref 4.22–5.81)
RDW: 16.4 % — ABNORMAL HIGH (ref 11.5–15.5)
RDW: 16.1 % — ABNORMAL HIGH (ref 11.5–15.5)
WBC: 18.6 10*3/uL — ABNORMAL HIGH (ref 4.0–10.5)
WBC: 19.1 10*3/uL — ABNORMAL HIGH (ref 4.0–10.5)
nRBC: 0.6 % — ABNORMAL HIGH (ref 0.0–0.2)
nRBC: 0.4 % — ABNORMAL HIGH (ref 0.0–0.2)

## 2021-02-27 MED ORDER — FENTANYL CITRATE (PF) 100 MCG/2ML IJ SOLN: 50.0000 ug | Freq: Once | INTRAMUSCULAR | Status: DC

## 2021-02-27 MED ORDER — FENTANYL 2500MCG IN NS 250ML (10MCG/ML) PREMIX INFUSION: 50.0000 ug/h | INTRAVENOUS | Status: DC

## 2021-02-27 MED ORDER — BETHANECHOL CHLORIDE 10 MG PO TABS
25.0000 mg | ORAL_TABLET | Freq: Three times a day (TID) | ORAL | Status: DC
  Administered 2021-02-27 – 2021-02-28 (×4): 25 mg
  Filled 2021-02-27 (×4): qty 3

## 2021-02-27 MED ORDER — ALTEPLASE 2 MG IJ SOLR
2.0000 mg | Freq: Once | INTRAMUSCULAR | Status: AC
  Administered 2021-02-27: 2 mg

## 2021-02-27 MED ORDER — GADOBUTROL 1 MMOL/ML IV SOLN
9.5000 mL | Freq: Once | INTRAVENOUS | Status: AC | PRN
  Administered 2021-02-27: 9.5 mL via INTRAVENOUS

## 2021-02-27 MED ORDER — MIDAZOLAM-SODIUM CHLORIDE 100-0.9 MG/100ML-% IV SOLN
2.0000 mg/h | INTRAVENOUS | Status: DC
  Administered 2021-02-27: 8 mg/h via INTRAVENOUS
  Administered 2021-02-27: 6 mg/h via INTRAVENOUS
  Administered 2021-02-28: 4 mg/h via INTRAVENOUS
  Administered 2021-03-01: 6 mg/h via INTRAVENOUS
  Filled 2021-02-27 (×3): qty 100

## 2021-02-27 MED ORDER — VECURONIUM BROMIDE 10 MG IV SOLR
0.8000 ug/kg/min | Status: DC
  Administered 2021-02-27: 1 ug/kg/min via INTRAVENOUS
  Administered 2021-02-28: 1.1 ug/kg/min via INTRAVENOUS
  Administered 2021-02-28 – 2021-03-01 (×2): 0.9 ug/kg/min via INTRAVENOUS
  Administered 2021-03-02: 0.901 ug/kg/min via INTRAVENOUS
  Administered 2021-03-03: 1 ug/kg/min via INTRAVENOUS
  Filled 2021-02-27 (×7): qty 100

## 2021-02-27 MED ORDER — CLEVIDIPINE BUTYRATE 0.5 MG/ML IV EMUL
0.0000 mg/h | INTRAVENOUS | Status: DC
Start: 2021-02-27 — End: 2021-03-02
  Administered 2021-02-27: 2 mg/h via INTRAVENOUS
  Administered 2021-02-27: 12 mg/h via INTRAVENOUS
  Administered 2021-02-27 (×2): 8 mg/h via INTRAVENOUS
  Administered 2021-02-27 (×2): 12 mg/h via INTRAVENOUS
  Administered 2021-02-28: 10 mg/h via INTRAVENOUS
  Administered 2021-02-28: 6 mg/h via INTRAVENOUS
  Administered 2021-02-28: 9 mg/h via INTRAVENOUS
  Administered 2021-02-28: 3 mg/h via INTRAVENOUS
  Administered 2021-02-28: 10 mg/h via INTRAVENOUS
  Administered 2021-03-01: 6 mg/h via INTRAVENOUS
  Administered 2021-03-01: 10 mg/h via INTRAVENOUS
  Administered 2021-03-01: 9 mg/h via INTRAVENOUS
  Filled 2021-02-27: qty 100
  Filled 2021-02-27 (×9): qty 50
  Filled 2021-02-27: qty 100
  Filled 2021-02-27 (×2): qty 50

## 2021-02-27 MED ORDER — METOPROLOL TARTRATE 5 MG/5ML IV SOLN
5.0000 mg | INTRAVENOUS | Status: DC | PRN
  Administered 2021-02-27 – 2021-03-02 (×5): 5 mg via INTRAVENOUS
  Filled 2021-02-27 (×5): qty 5

## 2021-02-27 MED ORDER — FENTANYL BOLUS VIA INFUSION
50.0000 ug | INTRAVENOUS | Status: DC | PRN
Start: 2021-02-27 — End: 2021-03-02
  Filled 2021-02-27: qty 50

## 2021-02-27 MED ORDER — FUROSEMIDE 10 MG/ML IJ SOLN
40.0000 mg | Freq: Once | INTRAMUSCULAR | Status: AC
  Administered 2021-02-27: 40 mg via INTRAVENOUS
  Filled 2021-02-27: qty 4

## 2021-02-27 MED ORDER — ARTIFICIAL TEARS OPHTHALMIC OINT
1.0000 "application " | TOPICAL_OINTMENT | Freq: Three times a day (TID) | OPHTHALMIC | Status: DC
  Administered 2021-02-27 – 2021-03-07 (×24): 1 via OPHTHALMIC
  Filled 2021-02-27 (×3): qty 3.5

## 2021-02-27 MED ORDER — MIDAZOLAM BOLUS VIA INFUSION
1.0000 mg | INTRAVENOUS | Status: DC | PRN
  Filled 2021-02-27: qty 2

## 2021-02-27 MED ORDER — IOHEXOL 350 MG/ML SOLN
75.0000 mL | Freq: Once | INTRAVENOUS | Status: AC | PRN
  Administered 2021-02-27: 75 mL via INTRAVENOUS

## 2021-02-27 NOTE — Progress Notes (Signed)
Patient ID: Scott [name], male   DOB: Nov 13, 1985, 35 y.o.   MRN: [id number] Patient remains comatose minimal response to noxious stimulation pupils equal reactive sedation has been held.  CT scan unchanged.  Unclear patient still has picture of DI versus third spaced sedation.  CTA pending

## 2021-02-27 NOTE — Progress Notes (Signed)
ANTICOAGULATION CONSULT NOTE - Follow Up Consult  Pharmacy Consult for Heparin Indication: DVT  Allergies  Allergen Reactions   Lactose Intolerance (Gi)     Patient Measurements: Height: 5\' 11"  (180.3 cm) Weight: 92.5 kg (203 lb 14.8 oz) IBW/kg (Calculated) : 75.3 Heparin Dosing Weight: 92.5 kg  Vital Signs: Temp: 100.6 F (38.1 C) (08/21 1921) Temp Source: Axillary (08/21 1600) BP: 140/66 (08/21 2200) Pulse Rate: 124 (08/21 2200)  Labs: Recent Labs    02/25/21 0607 02/26/21 0413 02/26/21 0534 02/26/21 1744 02/27/21 0100 02/27/21 0624 02/27/21 0743 02/27/21 1544 02/27/21 2233  HGB 8.0*   < > 8.4*  --  7.9* 7.9*  --   --   --   HCT 25.7*   < > 26.9*  --  25.5* 25.9*  --   --   --   PLT 356  --  427*  --  447* 455*  --   --   --   HEPARINUNFRC  --   --   --    < > 0.11*  --  0.11* 0.12* 0.11*  CREATININE 0.61  --  0.50*  --   --  0.51*  --   --   --    < > = values in this interval not displayed.     Estimated Creatinine Clearance: 149.8 mL/min (A) (by C-G formula based on SCr of 0.51 mg/dL (L)).   Assessment: 11 YOM presenting with TBI/SAH, now with acute R-calf DVT.  Trauma OK with heparin gtt s/p new head CT results of  hemorrhagic contusion of peripheral right temporal lobe and small overlying subdural hematomas.  Heparin level remains subtherapeutic (0.11) on heparin infusion at 2150 units/hr. Will continue cautious titration with target of low end goals. No issues with line or bleeding reported per RN.  Goal of Therapy:  Heparin level 0.3 to 0.5  units/ml Monitor platelets by anticoagulation protocol: Yes   Plan:  Increase heparin gtt to 2300 units/hr F/u 6 hour heparin level  31, PharmD, BCPS Please see amion for complete clinical pharmacist phone list 02/27/2021 11:23 PM

## 2021-02-27 NOTE — Progress Notes (Signed)
Pt transported to CT and back to 4N 21 on full vent support. No complications noted. 

## 2021-02-27 NOTE — Progress Notes (Signed)
Pt has a TL PICC in his RUA, placed 02-20-21;  gray port was noted to be occluded earlier this shift, not giving a blood return;  TPA was instilled per policy, however there was still no blood return after a couple hours;  CXR report from today noted;  picc looped into the RIJ;  the [facility] was elevated, and  Red and White ports flushed  with 44ml, but with some resistance; blood returns were noted; Wallace Cullens port would not flush or give blood return;  dressing to PICC line was removed and the PICC was pulled back 1cm per recommendation from Shellia Carwin, RN, PICC Team;  all 3 ports were again power flushed , with 20cc NS to each port; blood returns noted from all 3 ports this time;  fluids/meds resumed; pt's RN at bedside during PICC assessment;  suggest another CXR for PICC tip verification after having pulled the line back 1cm.
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
## 2021-02-28 LAB — BASIC METABOLIC PANEL
Anion gap: 6 (ref 5–15)
Anion gap: 6 (ref 5–15)
BUN: 15 mg/dL (ref 6–20)
BUN: 18 mg/dL (ref 6–20)
CO2: 37 mmol/L — ABNORMAL HIGH (ref 22–32)
CO2: 43 mmol/L — ABNORMAL HIGH (ref 22–32)
Calcium: 8.2 mg/dL — ABNORMAL LOW (ref 8.9–10.3)
Calcium: 8.3 mg/dL — ABNORMAL LOW (ref 8.9–10.3)
Chloride: 108 mmol/L (ref 98–111)
Chloride: 101 mmol/L (ref 98–111)
Creatinine, Ser: 0.48 mg/dL — ABNORMAL LOW (ref 0.61–1.24)
Creatinine, Ser: 0.6 mg/dL — ABNORMAL LOW (ref 0.61–1.24)
GFR, Estimated: >60 mL/min (ref >60)
GFR, Estimated: >60 mL/min (ref >60)
Glucose, Bld: 172 mg/dL — ABNORMAL HIGH (ref 70–99)
Glucose, Bld: 119 mg/dL — ABNORMAL HIGH (ref 70–99)
Potassium: 3.1 mmol/L — ABNORMAL LOW (ref 3.5–5.1)
Potassium: 3.4 mmol/L — ABNORMAL LOW (ref 3.5–5.1)
Sodium: 151 mmol/L — ABNORMAL HIGH (ref 135–145)
Sodium: 150 mmol/L — ABNORMAL HIGH (ref 135–145)

## 2021-02-28 LAB — POCT I-STAT 7, (LYTES, BLD GAS, ICA,H+H)
Acid-Base Excess: 13 mmol/L — ABNORMAL HIGH (ref 0.0–2.0)
Acid-Base Excess: 17 mmol/L — ABNORMAL HIGH (ref 0.0–2.0)
Bicarbonate: 39.4 mmol/L — ABNORMAL HIGH (ref 20.0–28.0)
Bicarbonate: 43.4 mmol/L — ABNORMAL HIGH (ref 20.0–28.0)
Calcium, Ion: 1.19 mmol/L (ref 1.15–1.40)
Calcium, Ion: 1.15 mmol/L (ref 1.15–1.40)
HCT: 25 % — ABNORMAL LOW (ref 39.0–52.0)
HCT: 26 % — ABNORMAL LOW (ref 39.0–52.0)
Hemoglobin: 8.5 g/dL — ABNORMAL LOW (ref 13.0–17.0)
Hemoglobin: 8.8 g/dL — ABNORMAL LOW (ref 13.0–17.0)
O2 Saturation: 92 %
O2 Saturation: 91 %
Patient temperature: 99.5
Patient temperature: 99.1
Potassium: 3.2 mmol/L — ABNORMAL LOW (ref 3.5–5.1)
Potassium: 3.1 mmol/L — ABNORMAL LOW (ref 3.5–5.1)
Sodium: 152 mmol/L — ABNORMAL HIGH (ref 135–145)
Sodium: 152 mmol/L — ABNORMAL HIGH (ref 135–145)
TCO2: 41 mmol/L — ABNORMAL HIGH (ref 22–32)
TCO2: 45 mmol/L — ABNORMAL HIGH (ref 22–32)
pCO2 arterial: 62.9 mmHg — ABNORMAL HIGH (ref 32.0–48.0)
pCO2 arterial: 62.5 mmHg — ABNORMAL HIGH (ref 32.0–48.0)
pH, Arterial: 7.407 (ref 7.350–7.450)
pH, Arterial: 7.45 (ref 7.350–7.450)
pO2, Arterial: 69 mmHg — ABNORMAL LOW (ref 83.0–108.0)
pO2, Arterial: 63 mmHg — ABNORMAL LOW (ref 83.0–108.0)

## 2021-02-28 LAB — CBC
HCT: 28.3 % — ABNORMAL LOW (ref 39.0–52.0)
Hemoglobin: 8.6 g/dL — ABNORMAL LOW (ref 13.0–17.0)
MCH: 31.4 pg (ref 26.0–34.0)
MCHC: 30.4 g/dL (ref 30.0–36.0)
MCV: 103.3 fL — ABNORMAL HIGH (ref 80.0–100.0)
Platelets: 584 10*3/uL — ABNORMAL HIGH (ref 150–400)
RBC: 2.74 MIL/uL — ABNORMAL LOW (ref 4.22–5.81)
RDW: 16.3 % — ABNORMAL HIGH (ref 11.5–15.5)
WBC: 20.7 10*3/uL — ABNORMAL HIGH (ref 4.0–10.5)
nRBC: 0.5 % — ABNORMAL HIGH (ref 0.0–0.2)

## 2021-02-28 LAB — GLUCOSE, CAPILLARY
Glucose-Capillary: 122 mg/dL — ABNORMAL HIGH (ref 70–99)
Glucose-Capillary: 124 mg/dL — ABNORMAL HIGH (ref 70–99)
Glucose-Capillary: 131 mg/dL — ABNORMAL HIGH (ref 70–99)
Glucose-Capillary: 139 mg/dL — ABNORMAL HIGH (ref 70–99)
Glucose-Capillary: 154 mg/dL — ABNORMAL HIGH (ref 70–99)
Glucose-Capillary: 158 mg/dL — ABNORMAL HIGH (ref 70–99)
Glucose-Capillary: 177 mg/dL — ABNORMAL HIGH (ref 70–99)

## 2021-02-28 LAB — APTT
aPTT: 66 s — ABNORMAL HIGH (ref 24–36)
aPTT: 69 s — ABNORMAL HIGH (ref 24–36)

## 2021-02-28 LAB — PHOSPHORUS: Phosphorus: 1.5 mg/dL — ABNORMAL LOW (ref 2.5–4.6)

## 2021-02-28 LAB — MAGNESIUM: Magnesium: 2.4 mg/dL (ref 1.7–2.4)

## 2021-02-28 LAB — TRIGLYCERIDES: Triglycerides: 148 mg/dL (ref <150)

## 2021-02-28 MED ORDER — PIVOT 1.5 CAL PO LIQD
1000.0000 mL | ORAL | Status: DC
  Administered 2021-02-28 – 2021-03-01 (×3): 1000 mL

## 2021-02-28 MED ORDER — FUROSEMIDE 10 MG/ML IJ SOLN
60.0000 mg | Freq: Once | INTRAMUSCULAR | Status: AC
  Administered 2021-02-28: 60 mg via INTRAVENOUS
  Filled 2021-02-28: qty 6

## 2021-02-28 MED ORDER — METOLAZONE 5 MG PO TABS
10.0000 mg | ORAL_TABLET | Freq: Once | ORAL | Status: AC
  Administered 2021-02-28: 10 mg
  Filled 2021-02-28: qty 2

## 2021-02-28 MED ORDER — SODIUM CHLORIDE 0.9 % IV SOLN
INTRAVENOUS | Status: DC | PRN
  Administered 2021-02-28: 250 mL via INTRAVENOUS
  Administered 2021-02-28: 1000 mL via INTRAVENOUS

## 2021-02-28 MED ORDER — METOPROLOL TARTRATE 25 MG PO TABS
25.0000 mg | ORAL_TABLET | Freq: Two times a day (BID) | ORAL | Status: DC
  Administered 2021-02-28 (×2): 25 mg
  Filled 2021-02-28 (×2): qty 1

## 2021-02-28 MED ORDER — SODIUM CHLORIDE 0.9 % IV SOLN
0.1500 mg/kg/h | INTRAVENOUS | Status: AC
  Administered 2021-02-28 – 2021-03-01 (×2): 0.15 mg/kg/h via INTRAVENOUS
  Filled 2021-02-28 (×2): qty 250

## 2021-02-28 MED ORDER — POTASSIUM CHLORIDE 10 MEQ/50ML IV SOLN
10.0000 meq | INTRAVENOUS | Status: AC
  Administered 2021-02-28 (×10): 10 meq via INTRAVENOUS
  Filled 2021-02-28 (×11): qty 50

## 2021-02-28 MED ORDER — FUROSEMIDE 10 MG/ML IJ SOLN
80.0000 mg | Freq: Once | INTRAMUSCULAR | Status: AC
  Administered 2021-02-28: 80 mg via INTRAVENOUS
  Filled 2021-02-28: qty 8
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
## 2021-03-01 LAB — POCT I-STAT 7, (LYTES, BLD GAS, ICA,H+H)
Acid-Base Excess: 21 mmol/L — ABNORMAL HIGH (ref 0.0–2.0)
Acid-Base Excess: 15 mmol/L — ABNORMAL HIGH (ref 0.0–2.0)
Acid-Base Excess: 22 mmol/L — ABNORMAL HIGH (ref 0.0–2.0)
Bicarbonate: 49.3 mmol/L — ABNORMAL HIGH (ref 20.0–28.0)
Bicarbonate: 44.2 mmol/L — ABNORMAL HIGH (ref 20.0–28.0)
Bicarbonate: 51.2 mmol/L — ABNORMAL HIGH (ref 20.0–28.0)
Calcium, Ion: 1.11 mmol/L — ABNORMAL LOW (ref 1.15–1.40)
Calcium, Ion: 1.09 mmol/L — ABNORMAL LOW (ref 1.15–1.40)
Calcium, Ion: 1.16 mmol/L (ref 1.15–1.40)
HCT: 32 % — ABNORMAL LOW (ref 39.0–52.0)
HCT: 26 % — ABNORMAL LOW (ref 39.0–52.0)
HCT: 26 % — ABNORMAL LOW (ref 39.0–52.0)
Hemoglobin: 10.9 g/dL — ABNORMAL LOW (ref 13.0–17.0)
Hemoglobin: 8.8 g/dL — ABNORMAL LOW (ref 13.0–17.0)
Hemoglobin: 8.8 g/dL — ABNORMAL LOW (ref 13.0–17.0)
O2 Saturation: 95 %
O2 Saturation: 91 %
O2 Saturation: 99 %
Patient temperature: 98.6
Patient temperature: 98.5
Patient temperature: 98.8
Potassium: 3.6 mmol/L (ref 3.5–5.1)
Potassium: 3.8 mmol/L (ref 3.5–5.1)
Potassium: 4.5 mmol/L (ref 3.5–5.1)
Sodium: 146 mmol/L — ABNORMAL HIGH (ref 135–145)
Sodium: 148 mmol/L — ABNORMAL HIGH (ref 135–145)
Sodium: 146 mmol/L — ABNORMAL HIGH (ref 135–145)
TCO2: >50 mmol/L — ABNORMAL HIGH (ref 22–32)
TCO2: 47 mmol/L — ABNORMAL HIGH (ref 22–32)
TCO2: >50 mmol/L — ABNORMAL HIGH (ref 22–32)
pCO2 arterial: 76.3 mmHg (ref 32.0–48.0)
pCO2 arterial: 93.3 mmHg (ref 32.0–48.0)
pCO2 arterial: 91.8 mmHg (ref 32.0–48.0)
pH, Arterial: 7.418 (ref 7.350–7.450)
pH, Arterial: 7.283 — ABNORMAL LOW (ref 7.350–7.450)
pH, Arterial: 7.355 (ref 7.350–7.450)
pO2, Arterial: 79 mmHg — ABNORMAL LOW (ref 83.0–108.0)
pO2, Arterial: 74 mmHg — ABNORMAL LOW (ref 83.0–108.0)
pO2, Arterial: 154 mmHg — ABNORMAL HIGH (ref 83.0–108.0)

## 2021-03-01 LAB — GLUCOSE, CAPILLARY
Glucose-Capillary: 120 mg/dL — ABNORMAL HIGH (ref 70–99)
Glucose-Capillary: 151 mg/dL — ABNORMAL HIGH (ref 70–99)
Glucose-Capillary: 175 mg/dL — ABNORMAL HIGH (ref 70–99)
Glucose-Capillary: 110 mg/dL — ABNORMAL HIGH (ref 70–99)
Glucose-Capillary: 112 mg/dL — ABNORMAL HIGH (ref 70–99)
Glucose-Capillary: 122 mg/dL — ABNORMAL HIGH (ref 70–99)

## 2021-03-01 LAB — BASIC METABOLIC PANEL
Anion gap: 7 (ref 5–15)
Anion gap: 8 (ref 5–15)
BUN: 18 mg/dL (ref 6–20)
BUN: 23 mg/dL — ABNORMAL HIGH (ref 6–20)
CO2: 42 mmol/L — ABNORMAL HIGH (ref 22–32)
CO2: 43 mmol/L — ABNORMAL HIGH (ref 22–32)
Calcium: 8.5 mg/dL — ABNORMAL LOW (ref 8.9–10.3)
Calcium: 8 mg/dL — ABNORMAL LOW (ref 8.9–10.3)
Chloride: 95 mmol/L — ABNORMAL LOW (ref 98–111)
Chloride: 94 mmol/L — ABNORMAL LOW (ref 98–111)
Creatinine, Ser: 0.48 mg/dL — ABNORMAL LOW (ref 0.61–1.24)
Creatinine, Ser: 0.63 mg/dL (ref 0.61–1.24)
GFR, Estimated: >60 mL/min (ref >60)
GFR, Estimated: >60 mL/min (ref >60)
Glucose, Bld: 143 mg/dL — ABNORMAL HIGH (ref 70–99)
Glucose, Bld: 114 mg/dL — ABNORMAL HIGH (ref 70–99)
Potassium: 3.3 mmol/L — ABNORMAL LOW (ref 3.5–5.1)
Potassium: 4.5 mmol/L (ref 3.5–5.1)
Sodium: 144 mmol/L (ref 135–145)
Sodium: 145 mmol/L (ref 135–145)

## 2021-03-01 LAB — CBC
HCT: 31.7 % — ABNORMAL LOW (ref 39.0–52.0)
Hemoglobin: 10.1 g/dL — ABNORMAL LOW (ref 13.0–17.0)
MCH: 31.9 pg (ref 26.0–34.0)
MCHC: 31.9 g/dL (ref 30.0–36.0)
MCV: 100 fL (ref 80.0–100.0)
Platelets: 757 10*3/uL — ABNORMAL HIGH (ref 150–400)
RBC: 3.17 MIL/uL — ABNORMAL LOW (ref 4.22–5.81)
RDW: 16 % — ABNORMAL HIGH (ref 11.5–15.5)
WBC: 24.6 10*3/uL — ABNORMAL HIGH (ref 4.0–10.5)
nRBC: 0.2 % (ref 0.0–0.2)

## 2021-03-01 LAB — CULTURE, BLOOD (ROUTINE X 2)
Culture: NO GROWTH
Culture: NO GROWTH
Special Requests: ADEQUATE
Special Requests: ADEQUATE

## 2021-03-01 LAB — APTT: aPTT: 56 s — ABNORMAL HIGH (ref 24–36)

## 2021-03-01 LAB — MAGNESIUM: Magnesium: 2.5 mg/dL — ABNORMAL HIGH (ref 1.7–2.4)

## 2021-03-01 LAB — PHOSPHORUS: Phosphorus: 5.4 mg/dL — ABNORMAL HIGH (ref 2.5–4.6)

## 2021-03-01 MED ORDER — METOLAZONE 5 MG PO TABS
10.0000 mg | ORAL_TABLET | Freq: Once | ORAL | Status: AC
  Administered 2021-03-01: 10 mg
  Filled 2021-03-01: qty 2

## 2021-03-01 MED ORDER — OXYCODONE HCL 5 MG PO TABS
10.0000 mg | ORAL_TABLET | ORAL | Status: DC
  Administered 2021-03-01 – 2021-03-07 (×37): 10 mg
  Filled 2021-03-01 (×37): qty 2

## 2021-03-01 MED ORDER — SODIUM CHLORIDE 0.9 % IV SOLN
INTRAVENOUS | Status: DC | PRN
  Administered 2021-03-01 – 2021-03-15 (×4): 250 mL via INTRAVENOUS
  Administered 2021-03-19: 500 mL via INTRAVENOUS

## 2021-03-01 MED ORDER — BETHANECHOL CHLORIDE 25 MG PO TABS
50.0000 mg | ORAL_TABLET | Freq: Three times a day (TID) | ORAL | Status: DC
  Administered 2021-03-01 – 2021-03-16 (×48): 50 mg
  Filled 2021-03-01 (×14): qty 2
  Filled 2021-03-01: qty 5
  Filled 2021-03-01: qty 2
  Filled 2021-03-01: qty 5
  Filled 2021-03-01 (×19): qty 2
  Filled 2021-03-01: qty 5
  Filled 2021-03-01 (×7): qty 2
  Filled 2021-03-01: qty 5
  Filled 2021-03-01 (×4): qty 2

## 2021-03-01 MED ORDER — SODIUM CHLORIDE 0.9% FLUSH
10.0000 mL | Freq: Two times a day (BID) | INTRAVENOUS | Status: DC
  Administered 2021-03-01 – 2021-03-08 (×13): 10 mL

## 2021-03-01 MED ORDER — FUROSEMIDE 10 MG/ML IJ SOLN
100.0000 mg | Freq: Once | INTRAVENOUS | Status: AC
  Administered 2021-03-01: 100 mg via INTRAVENOUS
  Filled 2021-03-01: qty 10

## 2021-03-01 MED ORDER — SODIUM CHLORIDE 0.9% FLUSH
10.0000 mL | INTRAVENOUS | Status: DC | PRN
  Administered 2021-03-01: 10 mL

## 2021-03-01 MED ORDER — METOPROLOL TARTRATE 50 MG PO TABS
75.0000 mg | ORAL_TABLET | Freq: Two times a day (BID) | ORAL | Status: DC
  Administered 2021-03-01 – 2021-03-03 (×5): 75 mg
  Filled 2021-03-01 (×5): qty 1

## 2021-03-01 MED ORDER — FUROSEMIDE 10 MG/ML IJ SOLN
40.0000 mg | Freq: Once | INTRAMUSCULAR | Status: AC
  Administered 2021-03-01: 40 mg via INTRAVENOUS
  Filled 2021-03-01: qty 4

## 2021-03-01 MED ORDER — METOPROLOL TARTRATE 50 MG PO TABS
100.0000 mg | ORAL_TABLET | Freq: Two times a day (BID) | ORAL | Status: DC
  Administered 2021-03-01: 100 mg
  Filled 2021-03-01: qty 2

## 2021-03-01 MED ORDER — ASPIRIN 325 MG PO TABS
325.0000 mg | ORAL_TABLET | Freq: Every day | ORAL | Status: DC
  Administered 2021-03-01 – 2021-03-18 (×18): 325 mg
  Filled 2021-03-01 (×18): qty 1

## 2021-03-01 MED ORDER — ENOXAPARIN SODIUM 100 MG/ML IJ SOSY
90.0000 mg | PREFILLED_SYRINGE | Freq: Two times a day (BID) | INTRAMUSCULAR | Status: DC
  Administered 2021-03-01 – 2021-03-06 (×12): 90 mg via SUBCUTANEOUS
  Filled 2021-03-01 (×13): qty 0.9

## 2021-03-01 MED ORDER — POTASSIUM CHLORIDE 10 MEQ/50ML IV SOLN
10.0000 meq | INTRAVENOUS | Status: AC
  Administered 2021-03-01 (×12): 10 meq via INTRAVENOUS
  Filled 2021-03-01 (×12): qty 50
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
## 2021-03-01 NOTE — Progress Notes (Signed)
Pt remains intubated, sedated, and paralyzed for treatment of ARDS and therefore without neurologic exam. Last MRI largely unchanged with small right temporal contusion, minor SDH, and radiographic signs of DAI. Will cont to follow in EMR and reassess once off sedation/paralytic.   Lisbeth Renshaw, MD [facility]

## 2021-03-01 NOTE — Progress Notes (Signed)
Critical ABG result given to Dr Bedelia Person at [phone]. Vent changes made by her & will redo ABG in a couple hours
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
## 2021-03-01 NOTE — Progress Notes (Signed)
Critical ABG result given to Dr Bedelia Person & was expected pCO2-91
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
## 2021-03-02 ENCOUNTER — Inpatient Hospital Stay (HOSPITAL_COMMUNITY): Payer: 59

## 2021-03-02 LAB — POCT I-STAT 7, (LYTES, BLD GAS, ICA,H+H)
Acid-Base Excess: 26 mmol/L — ABNORMAL HIGH (ref 0.0–2.0)
Acid-Base Excess: 30 mmol/L — ABNORMAL HIGH (ref 0.0–2.0)
Bicarbonate: 55.2 mmol/L — ABNORMAL HIGH (ref 20.0–28.0)
Bicarbonate: 57.8 mmol/L — ABNORMAL HIGH (ref 20.0–28.0)
Calcium, Ion: 1.18 mmol/L (ref 1.15–1.40)
Calcium, Ion: 1.14 mmol/L — ABNORMAL LOW (ref 1.15–1.40)
HCT: 32 % — ABNORMAL LOW (ref 39.0–52.0)
HCT: 29 % — ABNORMAL LOW (ref 39.0–52.0)
Hemoglobin: 10.9 g/dL — ABNORMAL LOW (ref 13.0–17.0)
Hemoglobin: 9.9 g/dL — ABNORMAL LOW (ref 13.0–17.0)
O2 Saturation: 91 %
O2 Saturation: 99 %
Patient temperature: 98.6
Patient temperature: 98.6
Potassium: 3.9 mmol/L (ref 3.5–5.1)
Potassium: 3.5 mmol/L (ref 3.5–5.1)
Sodium: 142 mmol/L (ref 135–145)
Sodium: 140 mmol/L (ref 135–145)
TCO2: >50 mmol/L — ABNORMAL HIGH (ref 22–32)
TCO2: >50 mmol/L — ABNORMAL HIGH (ref 22–32)
pCO2 arterial: 91.5 mmHg (ref 32.0–48.0)
pCO2 arterial: 81.2 mmHg (ref 32.0–48.0)
pH, Arterial: 7.388 (ref 7.350–7.450)
pH, Arterial: 7.461 — ABNORMAL HIGH (ref 7.350–7.450)
pO2, Arterial: 67 mmHg — ABNORMAL LOW (ref 83.0–108.0)
pO2, Arterial: 125 mmHg — ABNORMAL HIGH (ref 83.0–108.0)

## 2021-03-02 LAB — BASIC METABOLIC PANEL
Anion gap: 10 (ref 5–15)
BUN: 22 mg/dL — ABNORMAL HIGH (ref 6–20)
CO2: 48 mmol/L — ABNORMAL HIGH (ref 22–32)
Calcium: 9.1 mg/dL (ref 8.9–10.3)
Chloride: 88 mmol/L — ABNORMAL LOW (ref 98–111)
Creatinine, Ser: 0.52 mg/dL — ABNORMAL LOW (ref 0.61–1.24)
GFR, Estimated: >60 mL/min (ref >60)
Glucose, Bld: 137 mg/dL — ABNORMAL HIGH (ref 70–99)
Potassium: 4.1 mmol/L (ref 3.5–5.1)
Sodium: 146 mmol/L — ABNORMAL HIGH (ref 135–145)

## 2021-03-02 LAB — GLUCOSE, CAPILLARY
Glucose-Capillary: 99 mg/dL (ref 70–99)
Glucose-Capillary: 131 mg/dL — ABNORMAL HIGH (ref 70–99)
Glucose-Capillary: 130 mg/dL — ABNORMAL HIGH (ref 70–99)
Glucose-Capillary: 106 mg/dL — ABNORMAL HIGH (ref 70–99)
Glucose-Capillary: 126 mg/dL — ABNORMAL HIGH (ref 70–99)
Glucose-Capillary: 121 mg/dL — ABNORMAL HIGH (ref 70–99)

## 2021-03-02 LAB — CBC
HCT: 29 % — ABNORMAL LOW (ref 39.0–52.0)
Hemoglobin: 8.8 g/dL — ABNORMAL LOW (ref 13.0–17.0)
MCH: 31.4 pg (ref 26.0–34.0)
MCHC: 30.3 g/dL (ref 30.0–36.0)
MCV: 103.6 fL — ABNORMAL HIGH (ref 80.0–100.0)
Platelets: 605 10*3/uL — ABNORMAL HIGH (ref 150–400)
RBC: 2.8 MIL/uL — ABNORMAL LOW (ref 4.22–5.81)
RDW: 16.5 % — ABNORMAL HIGH (ref 11.5–15.5)
WBC: 17.2 10*3/uL — ABNORMAL HIGH (ref 4.0–10.5)
nRBC: 0.2 % (ref 0.0–0.2)

## 2021-03-02 LAB — HEPARIN ANTI-XA: Heparin LMW: 0.78 [IU]/mL

## 2021-03-02 MED ORDER — PROSOURCE TF PO LIQD
90.0000 mL | Freq: Two times a day (BID) | ORAL | Status: DC
  Administered 2021-03-02 – 2021-03-09 (×14): 90 mL
  Filled 2021-03-02 (×14): qty 90

## 2021-03-02 MED ORDER — CHLORHEXIDINE GLUCONATE CLOTH 2 % EX PADS
6.0000 | MEDICATED_PAD | Freq: Every day | CUTANEOUS | Status: DC
  Administered 2021-03-03 – 2021-03-23 (×21): 6 via TOPICAL

## 2021-03-02 MED ORDER — PIVOT 1.5 CAL PO LIQD
1000.0000 mL | ORAL | Status: DC
  Administered 2021-03-02 – 2021-03-09 (×6): 1000 mL

## 2021-03-02 MED ORDER — PANTOPRAZOLE SODIUM 40 MG PO PACK
40.0000 mg | PACK | Freq: Every day | ORAL | Status: DC
  Administered 2021-03-03 – 2021-03-24 (×22): 40 mg
  Filled 2021-03-02 (×22): qty 20

## 2021-03-02 MED ORDER — FUROSEMIDE 10 MG/ML IJ SOLN
40.0000 mg | Freq: Once | INTRAMUSCULAR | Status: AC
  Administered 2021-03-02: 40 mg via INTRAVENOUS
  Filled 2021-03-02: qty 4

## 2021-03-02 NOTE — Progress Notes (Signed)
Patient's head turned to left and arms repositioned at this time by RT and RN.
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
## 2021-03-02 NOTE — Progress Notes (Signed)
Patient's head repositioned to the left with RT x 2 & RN without any complications.

## 2021-03-02 NOTE — Progress Notes (Signed)
ANTICOAGULATION CONSULT NOTE - Follow Up Consult  Pharmacy Consult for enoxaparin Indication: DVT  Allergies  Allergen Reactions   Lactose Intolerance (Gi)     Patient Measurements: Height: 5\' 11"  (180.3 cm) Weight: 91.7 kg (202 lb 2.6 oz) IBW/kg (Calculated) : 75.3  Vital Signs: Temp: 97.5 F (36.4 C) (08/24 1100) Temp Source: Axillary (08/24 1100) BP: 135/67 (08/24 1508) Pulse Rate: 82 (08/24 1508)  Labs: Recent Labs    02/27/21 1544 02/27/21 2233 02/28/21 0431 02/28/21 0553 02/28/21 0915 02/28/21 1106 02/28/21 1600 03/01/21 0524 03/01/21 0834 03/01/21 1501 03/01/21 1654 03/02/21 0430 03/02/21 0843 03/02/21 1234 03/02/21 1425  HGB  --   --    < > 8.6*   < >  --   --  10.1*   < >  --    < > 8.8* 10.9* 9.9*  --   HCT  --   --    < > 28.3*   < >  --   --  31.7*   < >  --    < > 29.0* 32.0* 29.0*  --   PLT  --   --   --  584*  --   --   --  757*  --   --   --  605*  --   --   --   APTT  --   --   --   --   --  66* 69* 56*  --   --   --   --   --   --   --   HEPARINUNFRC 0.12* 0.11*  --   --   --   --   --   --   --   --   --   --   --   --   --   HEPRLOWMOCWT  --   --   --   --   --   --   --   --   --   --   --   --   --   --  0.78  CREATININE  --   --    < > 0.48*  --   --  0.60* 0.48*  --  0.63  --  0.52*  --   --   --    < > = values in this interval not displayed.     Estimated Creatinine Clearance: 149.3 mL/min (A) (by C-G formula based on SCr of 0.52 mg/dL (L)).   Assessment: 29 YOM presenting with TBI/SAH, now with acute R-calf DVT. Trauma OK with full dose anticoagulation s/p new head CT results of  hemorrhagic contusion of peripheral right temporal lobe and small overlying subdural hematomas.  Patient transitioned to lovenox to help manage volume on 8/23. LMWH level resulted therapeutic this afternoon at 0.79, drawn appropriately. CBC stable. No currently active bleed issues noted.  Goal of Therapy:  Anti-Xa level 0.6-1 units/ml 4hrs after LMWH dose  given Monitor platelets by anticoagulation protocol: Yes   Plan:  Continue enoxaparin 90mg  SQ q12h Monitor renal function, CBC, weight changes with diuresis, s/s bleeding   9/23, PharmD, BCPS Please check AMION for all [facility] contact numbers Clinical Pharmacist 03/02/2021 3:20 PM

## 2021-03-02 NOTE — Progress Notes (Signed)
Patient's head repositioned to the right at 15:05 by RT & RN without any complications.

## 2021-03-02 NOTE — Progress Notes (Signed)
Patient's head repositioned to the left at 17:20 by RT & RN x 2 without any complications.

## 2021-03-02 NOTE — Progress Notes (Signed)
Patient's head turned to right and arms repositioned by RT and RN x2 without complications.

## 2021-03-02 NOTE — Progress Notes (Signed)
Patient's ETT was taped with cloth tape and mepilex pads were placed on patient's cheeks & above upper lip. Patient was proned at 09:50 without any complications. Head is currently positioned to the right.

## 2021-03-03 ENCOUNTER — Inpatient Hospital Stay (HOSPITAL_COMMUNITY): Payer: 59

## 2021-03-03 DIAGNOSIS — S0291XA Unspecified fracture of skull, initial encounter for closed fracture: Secondary | ICD-10-CM

## 2021-03-03 DIAGNOSIS — J80 Acute respiratory distress syndrome: Secondary | ICD-10-CM

## 2021-03-03 DIAGNOSIS — G9340 Encephalopathy, unspecified: Secondary | ICD-10-CM

## 2021-03-03 DIAGNOSIS — T148XXA Other injury of unspecified body region, initial encounter: Secondary | ICD-10-CM

## 2021-03-03 DIAGNOSIS — J9601 Acute respiratory failure with hypoxia: Secondary | ICD-10-CM

## 2021-03-03 DIAGNOSIS — J9602 Acute respiratory failure with hypercapnia: Secondary | ICD-10-CM

## 2021-03-03 DIAGNOSIS — S0181XA Laceration without foreign body of other part of head, initial encounter: Secondary | ICD-10-CM

## 2021-03-03 LAB — CBC
HCT: 30.4 % — ABNORMAL LOW (ref 39.0–52.0)
Hemoglobin: 9.3 g/dL — ABNORMAL LOW (ref 13.0–17.0)
MCH: 31.1 pg (ref 26.0–34.0)
MCHC: 30.6 g/dL (ref 30.0–36.0)
MCV: 101.7 fL — ABNORMAL HIGH (ref 80.0–100.0)
Platelets: 688 10*3/uL — ABNORMAL HIGH (ref 150–400)
RBC: 2.99 MIL/uL — ABNORMAL LOW (ref 4.22–5.81)
RDW: 15.9 % — ABNORMAL HIGH (ref 11.5–15.5)
WBC: 15.4 10*3/uL — ABNORMAL HIGH (ref 4.0–10.5)
nRBC: 0.2 % (ref 0.0–0.2)

## 2021-03-03 LAB — POCT I-STAT 7, (LYTES, BLD GAS, ICA,H+H)
Acid-Base Excess: 27 mmol/L — ABNORMAL HIGH (ref 0.0–2.0)
Acid-Base Excess: 30 mmol/L — ABNORMAL HIGH (ref 0.0–2.0)
Bicarbonate: 56.3 mmol/L — ABNORMAL HIGH (ref 20.0–28.0)
Bicarbonate: 58.8 mmol/L — ABNORMAL HIGH (ref 20.0–28.0)
Calcium, Ion: 1.18 mmol/L (ref 1.15–1.40)
Calcium, Ion: 1.18 mmol/L (ref 1.15–1.40)
HCT: 32 % — ABNORMAL LOW (ref 39.0–52.0)
HCT: 33 % — ABNORMAL LOW (ref 39.0–52.0)
Hemoglobin: 10.9 g/dL — ABNORMAL LOW (ref 13.0–17.0)
Hemoglobin: 11.2 g/dL — ABNORMAL LOW (ref 13.0–17.0)
O2 Saturation: 90 %
O2 Saturation: 95 %
Patient temperature: 98.6
Potassium: 3.5 mmol/L (ref 3.5–5.1)
Potassium: 3.7 mmol/L (ref 3.5–5.1)
Sodium: 141 mmol/L (ref 135–145)
Sodium: 140 mmol/L (ref 135–145)
TCO2: >50 mmol/L — ABNORMAL HIGH (ref 22–32)
TCO2: >50 mmol/L — ABNORMAL HIGH (ref 22–32)
pCO2 arterial: 90.6 mmHg (ref 32.0–48.0)
pCO2 arterial: 82.6 mmHg (ref 32.0–48.0)
pH, Arterial: 7.401 (ref 7.350–7.450)
pH, Arterial: 7.46 — ABNORMAL HIGH (ref 7.350–7.450)
pO2, Arterial: 64 mmHg — ABNORMAL LOW (ref 83.0–108.0)
pO2, Arterial: 77 mmHg — ABNORMAL LOW (ref 83.0–108.0)

## 2021-03-03 LAB — BASIC METABOLIC PANEL
Anion gap: 7 (ref 5–15)
BUN: 21 mg/dL — ABNORMAL HIGH (ref 6–20)
CO2: 49 mmol/L — ABNORMAL HIGH (ref 22–32)
Calcium: 8.9 mg/dL (ref 8.9–10.3)
Chloride: 85 mmol/L — ABNORMAL LOW (ref 98–111)
Creatinine, Ser: 0.45 mg/dL — ABNORMAL LOW (ref 0.61–1.24)
GFR, Estimated: >60 mL/min (ref >60)
Glucose, Bld: 140 mg/dL — ABNORMAL HIGH (ref 70–99)
Potassium: 3.9 mmol/L (ref 3.5–5.1)
Sodium: 141 mmol/L (ref 135–145)

## 2021-03-03 LAB — GLUCOSE, CAPILLARY
Glucose-Capillary: 113 mg/dL — ABNORMAL HIGH (ref 70–99)
Glucose-Capillary: 115 mg/dL — ABNORMAL HIGH (ref 70–99)
Glucose-Capillary: 123 mg/dL — ABNORMAL HIGH (ref 70–99)
Glucose-Capillary: 128 mg/dL — ABNORMAL HIGH (ref 70–99)
Glucose-Capillary: 154 mg/dL — ABNORMAL HIGH (ref 70–99)
Glucose-Capillary: 167 mg/dL — ABNORMAL HIGH (ref 70–99)

## 2021-03-03 LAB — TRIGLYCERIDES: Triglycerides: 192 mg/dL — ABNORMAL HIGH (ref <150)

## 2021-03-03 MED ORDER — FUROSEMIDE 10 MG/ML IJ SOLN
40.0000 mg | Freq: Once | INTRAMUSCULAR | Status: AC
  Administered 2021-03-03: 40 mg via INTRAVENOUS
  Filled 2021-03-03: qty 4

## 2021-03-03 MED ORDER — METOLAZONE 5 MG PO TABS
10.0000 mg | ORAL_TABLET | Freq: Once | ORAL | Status: AC
  Administered 2021-03-03: 10 mg
  Filled 2021-03-03: qty 2

## 2021-03-03 MED ORDER — FUROSEMIDE 10 MG/ML IJ SOLN
40.0000 mg | Freq: Two times a day (BID) | INTRAMUSCULAR | Status: AC
  Administered 2021-03-03 – 2021-03-05 (×3): 40 mg via INTRAVENOUS
  Filled 2021-03-03 (×3): qty 4

## 2021-03-03 MED ORDER — MIDAZOLAM HCL 2 MG/2ML IJ SOLN
2.0000 mg | INTRAMUSCULAR | Status: DC | PRN
  Administered 2021-03-05 – 2021-03-25 (×28): 2 mg via INTRAVENOUS
  Filled 2021-03-03 (×32): qty 2

## 2021-03-03 NOTE — Progress Notes (Signed)
Patient's head was turned to the right with 2 Rn's assisting. No complications noted.

## 2021-03-03 NOTE — Progress Notes (Signed)
Patient proned at 10:15 AM with RT x 3 & several RN's without any complications. ETT was taped & secured with cloth tape.
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
## 2021-03-03 NOTE — Progress Notes (Signed)
Patient ID: Scott [name], male   DOB: [birth year], 35 y.o.   MRN: [id number] Follow up - Trauma Critical Care  Patient Details:    Scott [name] is an 35 y.o. male.  Lines/tubes : Airway 8 mm (Active)  Secured at (cm) 24 cm 03/03/21 0325  Measured From Lips 03/03/21 0325  Secured Location Center 03/03/21 0325  Secured By [employer] 03/03/21 0325  Tube Holder Repositioned Yes 03/03/21 0325  Prone position No 03/03/21 0325  Head position Left 03/02/21 2314  Cuff Pressure (cm H2O) Clear OR 27-39 CmH2O 03/03/21 0204  Site Condition Dry 03/03/21 0325     PICC Triple Lumen 02/20/21 PICC Right Basilic 39 cm 0 cm (Active)  Indication for Insertion or Continuance of Line Limited venous access - need for IV therapy >5 days (PICC only) 03/02/21 2000  Exposed Catheter (cm) 1 cm 02/27/21 1710  Site Assessment Dry;Clean;Intact 03/02/21 2000  Lumen #1 Status Infusing 03/02/21 2000  Lumen #2 Status Infusing 03/02/21 2000  Lumen #3 Status Infusing 03/02/21 2000  Dressing Type Transparent;Securing device 03/02/21 2000  Dressing Status Clean;Dry;Intact 03/02/21 2000  Antimicrobial disc in place? Yes 03/02/21 2000  Safety Lock Not Applicable 02/28/21 2000  Line Care Lumen 3 tubing changed;Connections checked and tightened 03/02/21 2000  Line Adjustment (NICU/IV Team Only) No 02/20/21 2000  Dressing Intervention Dressing changed;Antimicrobial disc changed 02/27/21 1710  Dressing Change Due 03/06/21 03/02/21 2000     Arterial Line 02/21/21 Left Radial (Active)  Site Assessment Clean;Dry;Intact 03/02/21 2000  Line Status Pulsatile blood flow 03/02/21 2000  Art Line Waveform Appropriate 03/02/21 2000  Art Line Interventions Zeroed and calibrated;Leveled;Connections checked and tightened;Flushed per protocol 03/02/21 2000  Color/Movement/Sensation Capillary refill less than 3 sec 03/02/21 2000  Dressing Type Transparent 03/02/21 2000  Dressing Status Other (Comment) 03/03/21 0236   Interventions Dressing changed;Dressing reinforced;Antimicrobial disc changed;New dressing 03/03/21 0236  Dressing Change Due 03/08/21 03/03/21 0236     NG/OG Vented/Dual Lumen Orogastric 16 Fr. Oral (Active)  Tube Position (Required) External length of tube 03/02/21 2000  Measurement (cm) (Required) 55 cm 03/02/21 0800  Ongoing Placement Verification (Required) (See row information) Yes 03/02/21 2000  Site Assessment Clean;Dry;Intact 03/02/21 2000  Interventions Cleansed;Retaped 02/26/21 2000  Status Clamped 03/02/21 2000  Drainage Appearance Tan 02/23/21 2000  Output (mL) 0 mL 02/25/21 2200     Flatus Tube/Pouch (Active)  Daily care Skin around tube assessed 03/02/21 2000  Output (mL) 100 mL 03/03/21 0600     Urethral Catheter [name], RN Non-latex 16 Fr. (Active)  Indication for Insertion or Continuance of Catheter Acute urinary retention (I&O Cath for 24 hrs prior to catheter insertion- Inpatient Only) 03/02/21 2000  Site Assessment Clean;Intact 03/02/21 2000  Catheter Maintenance Bag below level of bladder 03/02/21 2000  Collection Container Standard drainage bag 03/02/21 2000  Securement Method Securing device (Describe) 03/02/21 2000  Urinary Catheter Interventions (if applicable) Unclamped 03/02/21 2000  Output (mL) 200 mL 03/03/21 0600    Microbiology/Sepsis markers: Results for orders placed or performed during the hospital encounter of 02/15/21  Resp Panel by RT-PCR (Flu A&B, Covid) Nasopharyngeal Swab     Status: None   Collection Time: 02/15/21  8:14 PM   Specimen: Nasopharyngeal Swab; Nasopharyngeal(NP) swabs in vial transport medium  Result Value Ref Range Status   SARS Coronavirus 2 by RT PCR NEGATIVE NEGATIVE Final    Comment: (NOTE) SARS-CoV-2 target nucleic acids are NOT DETECTED.  The SARS-CoV-2 RNA is generally detectable in upper respiratory  specimens during the acute phase of infection. The lowest concentration of SARS-CoV-2 viral copies this  assay can detect is 138 copies/mL. A negative result does not preclude SARS-Cov-2 infection and should not be used as the sole basis for treatment or other patient management decisions. A negative result may occur with  improper specimen collection/handling, submission of specimen other than nasopharyngeal swab, presence of viral mutation(s) within the areas targeted by this assay, and inadequate number of viral copies(<138 copies/mL). A negative result must be combined with clinical observations, patient history, and epidemiological information. The expected result is Negative.  Fact Sheet for Patients:  BloggerCourse.com  Fact Sheet for Healthcare Providers:  SeriousBroker.it  This test is no t yet approved or cleared by the [place] FDA and  has been authorized for detection and/or diagnosis of SARS-CoV-2 by FDA under an Emergency Use Authorization (EUA). This EUA will remain  in effect (meaning this test can be used) for the duration of the COVID-19 declaration under Section 564(b)(1) of the Act, 21 U.S.C.section 360bbb-3(b)(1), unless the authorization is terminated  or revoked sooner.       Influenza A by PCR NEGATIVE NEGATIVE Final   Influenza B by PCR NEGATIVE NEGATIVE Final    Comment: (NOTE) The Xpert Xpress SARS-CoV-2/FLU/RSV plus assay is intended as an aid in the diagnosis of influenza from Nasopharyngeal swab specimens and should not be used as a sole basis for treatment. Nasal washings and aspirates are unacceptable for Xpert Xpress SARS-CoV-2/FLU/RSV testing.  Fact Sheet for Patients: BloggerCourse.com  Fact Sheet for Healthcare Providers: SeriousBroker.it  This test is not yet approved or cleared by the [place] FDA and has been authorized for detection and/or diagnosis of SARS-CoV-2 by FDA under an Emergency Use Authorization (EUA). This EUA will  remain in effect (meaning this test can be used) for the duration of the COVID-19 declaration under Section 564(b)(1) of the Act, 21 U.S.C. section 360bbb-3(b)(1), unless the authorization is terminated or revoked.  Performed at [facility], [address], [place], [place] [address]   MRSA Next Gen by PCR, Nasal     Status: None   Collection Time: 02/15/21  9:58 PM   Specimen: Nasal Mucosa; Nasal Swab  Result Value Ref Range Status   MRSA by PCR Next Gen NOT DETECTED NOT DETECTED Final    Comment: (NOTE) The GeneXpert MRSA Assay (FDA approved for NASAL specimens only), is one component of a comprehensive MRSA colonization surveillance program. It is not intended to diagnose MRSA infection nor to guide or monitor treatment for MRSA infections. Test performance is not FDA approved in patients less than 48 years old. Performed at [facility], [address], [place], [place] [address]   Culture, Respiratory w Gram Stain     Status: None   Collection Time: 02/17/21  2:04 PM   Specimen: Tracheal Aspirate; Respiratory  Result Value Ref Range Status   Specimen Description TRACHEAL ASPIRATE  Final   Special Requests NONE  Final   Gram Stain   Final    ABUNDANT WBC PRESENT,BOTH PMN AND MONONUCLEAR ABUNDANT GRAM NEGATIVE RODS MODERATE GRAM POSITIVE COCCI RARE GRAM POSITIVE RODS    Culture   Final    FEW Normal respiratory flora-no Staph aureus or Pseudomonas seen Performed at [facility], [address] [place], [place] [address]    Report Status 02/19/2021 FINAL  Final  Culture, blood (routine x 2)     Status: None   Collection Time:  02/17/21  2:24 PM   Specimen: BLOOD  Result Value Ref Range Status   Specimen Description BLOOD BLOOD RIGHT FOREARM  Final   Special Requests   Final    BOTTLES DRAWN AEROBIC AND ANAEROBIC Blood Culture adequate volume   Culture   Final    NO GROWTH 5 DAYS Performed at [facility], [address] [place], [place]  [address]    Report Status 02/22/2021 FINAL  Final  Culture, blood (routine x 2)     Status: None   Collection Time: 02/17/21  3:01 PM   Specimen: BLOOD  Result Value Ref Range Status   Specimen Description BLOOD LEFT ANTECUBITAL  Final   Special Requests   Final    BOTTLES DRAWN AEROBIC AND ANAEROBIC Blood Culture adequate volume   Culture   Final    NO GROWTH 5 DAYS Performed at [facility], [address], [place], [place] [address]    Report Status 02/22/2021 FINAL  Final  Culture, Respiratory w Gram Stain     Status: None   Collection Time: 02/20/21 12:49 PM   Specimen: Tracheal Aspirate; Respiratory  Result Value Ref Range Status   Specimen Description TRACHEAL ASPIRATE  Final   Special Requests NONE  Final   Gram Stain   Final    MODERATE WBC PRESENT,BOTH PMN AND MONONUCLEAR ABUNDANT GRAM POSITIVE COCCI IN PAIRS ABUNDANT GRAM NEGATIVE RODS Performed at [facility], [address] [place], [place] [address]    Culture   Final    ABUNDANT METHICILLIN RESISTANT STAPHYLOCOCCUS AUREUS ABUNDANT PSEUDOMONAS AERUGINOSA    Report Status 02/23/2021 FINAL  Final   Organism ID, Bacteria METHICILLIN RESISTANT STAPHYLOCOCCUS AUREUS  Final   Organism ID, Bacteria PSEUDOMONAS AERUGINOSA  Final      Susceptibility   Methicillin resistant staphylococcus aureus - MIC*    CIPROFLOXACIN <=0.5 SENSITIVE Sensitive     ERYTHROMYCIN <=0.25 SENSITIVE Sensitive     GENTAMICIN <=0.5 SENSITIVE Sensitive     OXACILLIN >=4 RESISTANT Resistant     TETRACYCLINE <=1 SENSITIVE Sensitive     VANCOMYCIN 1 SENSITIVE Sensitive     TRIMETH/SULFA <=10 SENSITIVE Sensitive     CLINDAMYCIN <=0.25 SENSITIVE Sensitive     RIFAMPIN <=0.5 SENSITIVE Sensitive     Inducible Clindamycin NEGATIVE Sensitive     * ABUNDANT METHICILLIN RESISTANT STAPHYLOCOCCUS AUREUS   Pseudomonas aeruginosa - MIC*    CEFTAZIDIME 4 SENSITIVE Sensitive     CIPROFLOXACIN 0.5 SENSITIVE Sensitive     GENTAMICIN <=1  SENSITIVE Sensitive     IMIPENEM 2 SENSITIVE Sensitive     PIP/TAZO 8 SENSITIVE Sensitive     CEFEPIME 2 SENSITIVE Sensitive     * ABUNDANT PSEUDOMONAS AERUGINOSA  Monkeypox Virus DNA, Qualitative Real-Time PCR     Status: None   Collection Time: 02/23/21  1:42 AM   Specimen: Arm, Right; Sterile Swab  Result Value Ref Range Status   Orthopoxvirus DNA, QL PCR NOT DETECTED NOT DETECTED Final    Comment: (NOTE) Non-variola Orthopoxvirus DNA not detected by real-time PCR. Performed At: [facility] [address] [place], [place] [id number] Jolene Schimke MD TI:[id number]   Culture, Respiratory w Gram Stain     Status: None   Collection Time: 02/23/21 10:05 AM   Specimen: Tracheal Aspirate; Respiratory  Result Value Ref Range Status   Specimen Description TRACHEAL ASPIRATE  Final   Special Requests NONE  Final   Gram Stain   Final    ABUNDANT  WBC PRESENT, PREDOMINANTLY PMN FEW GRAM NEGATIVE RODS RARE GRAM POSITIVE COCCI Performed at [facility], [address] [place], [place] [address]    Culture   Final    MODERATE PSEUDOMONAS AERUGINOSA RARE METHICILLIN RESISTANT STAPHYLOCOCCUS AUREUS    Report Status 02/26/2021 FINAL  Final   Organism ID, Bacteria PSEUDOMONAS AERUGINOSA  Final   Organism ID, Bacteria METHICILLIN RESISTANT STAPHYLOCOCCUS AUREUS  Final      Susceptibility   Methicillin resistant staphylococcus aureus - MIC*    CIPROFLOXACIN <=0.5 SENSITIVE Sensitive     ERYTHROMYCIN <=0.25 SENSITIVE Sensitive     GENTAMICIN <=0.5 SENSITIVE Sensitive     OXACILLIN >=4 RESISTANT Resistant     TETRACYCLINE <=1 SENSITIVE Sensitive     VANCOMYCIN <=0.5 SENSITIVE Sensitive     TRIMETH/SULFA <=10 SENSITIVE Sensitive     CLINDAMYCIN <=0.25 SENSITIVE Sensitive     RIFAMPIN <=0.5 SENSITIVE Sensitive     Inducible Clindamycin NEGATIVE Sensitive     * RARE METHICILLIN RESISTANT STAPHYLOCOCCUS AUREUS   Pseudomonas aeruginosa - MIC*    CEFTAZIDIME 4 SENSITIVE  Sensitive     CIPROFLOXACIN <=0.25 SENSITIVE Sensitive     GENTAMICIN <=1 SENSITIVE Sensitive     IMIPENEM 1 SENSITIVE Sensitive     PIP/TAZO 8 SENSITIVE Sensitive     CEFEPIME 2 SENSITIVE Sensitive     * MODERATE PSEUDOMONAS AERUGINOSA  Culture, blood (Routine X 2) w Reflex to ID Panel     Status: None   Collection Time: 02/24/21 10:53 AM   Specimen: BLOOD RIGHT HAND  Result Value Ref Range Status   Specimen Description BLOOD RIGHT HAND  Final   Special Requests   Final    BOTTLES DRAWN AEROBIC AND ANAEROBIC Blood Culture adequate volume   Culture   Final    NO GROWTH 5 DAYS Performed at [facility], [address], [place], [place] [address]    Report Status 03/01/2021 FINAL  Final  Culture, blood (Routine X 2) w Reflex to ID Panel     Status: None   Collection Time: 02/24/21 10:58 AM   Specimen: BLOOD LEFT HAND  Result Value Ref Range Status   Specimen Description BLOOD LEFT HAND  Final   Special Requests   Final    BOTTLES DRAWN AEROBIC AND ANAEROBIC Blood Culture adequate volume   Culture   Final    NO GROWTH 5 DAYS Performed at [facility], [address], [place], [place] [address]    Report Status 03/01/2021 FINAL  Final  SARS CORONAVIRUS 2 (TAT 6-24 HRS) Nasopharyngeal Nasopharyngeal Swab     Status: None   Collection Time: 02/25/21  9:20 AM   Specimen: Nasopharyngeal Swab  Result Value Ref Range Status   SARS Coronavirus 2 NEGATIVE NEGATIVE Final    Comment: (NOTE) SARS-CoV-2 target nucleic acids are NOT DETECTED.  The SARS-CoV-2 RNA is generally detectable in upper and lower respiratory specimens during the acute phase of infection. Negative results do not preclude SARS-CoV-2 infection, do not rule out co-infections with other pathogens, and should not be used as the sole basis for treatment or other patient management decisions. Negative results must be combined with clinical observations, patient history, and epidemiological information. The  expected result is Negative.  Fact Sheet for Patients: HairSlick.no  Fact Sheet for Healthcare Providers: quierodirigir.com  This test is not yet approved or cleared by the [place] FDA and  has been authorized for detection and/or diagnosis of SARS-CoV-2 by FDA under  an Emergency Use Authorization (EUA). This EUA will remain  in effect (meaning this test can be used) for the duration of the COVID-19 declaration under Se ction 564(b)(1) of the Act, 21 U.S.C. section 360bbb-3(b)(1), unless the authorization is terminated or revoked sooner.  Performed at [facility], [address], [place], [place] [address]     Anti-infectives:  Anti-infectives (From admission, onward)    Start     Dose/Rate Route Frequency Ordered Stop   02/24/21 1200  vancomycin (VANCOCIN) IVPB 1000 mg/200 mL premix        1,000 mg 200 mL/hr over 60 Minutes Intravenous Every 8 hours 02/24/21 0934 03/02/21 2020   02/23/21 0200  vancomycin (VANCOREADY) IVPB 1500 mg/300 mL  Status:  Discontinued        1,500 mg 150 mL/hr over 120 Minutes Intravenous Every 8 hours 02/22/21 1931 02/24/21 0934   02/22/21 2100  vancomycin (VANCOCIN) 250 mg in sodium chloride 0.9 % 100 mL IVPB        250 mg 100 mL/hr over 60 Minutes Intravenous  Once 02/22/21 2012 02/22/21 2150   02/22/21 2030  vancomycin (VANCOCIN) 250 mg in sodium chloride 0.9 % 500 mL IVPB  Status:  Discontinued        250 mg 250 mL/hr over 120 Minutes Intravenous  Once 02/22/21 1931 02/22/21 2012   02/21/21 1800  vancomycin (VANCOREADY) IVPB 1250 mg/250 mL  Status:  Discontinued        1,250 mg 166.7 mL/hr over 90 Minutes Intravenous Every 8 hours 02/21/21 0851 02/22/21 1931   02/21/21 1000  ceFEPIme (MAXIPIME) 2 g in sodium chloride 0.9 % 100 mL IVPB        2 g 200 mL/hr over 30 Minutes Intravenous Every 8 hours 02/21/21 0851 03/02/21 1749   02/21/21 1000  vancomycin (VANCOREADY) IVPB 2000  mg/400 mL        2,000 mg 200 mL/hr over 120 Minutes Intravenous  Once 02/21/21 0851 02/21/21 1306       Best Practice/Protocols:  VTE Prophylaxis: Lovenox (full dose) Continous Sedation ARDS paralytic  Consults: Treatment Team:  Serena Colonel, MD Lisbeth Renshaw, MD Myrene Galas, MD    Studies:    Events:  Subjective:    Overnight Issues:   Objective:  Vital signs for last 24 hours: Temp:  [97.5 F (36.4 C)-99.6 F (37.6 C)] 98.2 F (36.8 C) (08/25 0400) Pulse Rate:  [82-120] 118 (08/25 0600) Resp:  [24-30] 30 (08/25 0600) BP: (124-186)/(66-93) 124/73 (08/25 0600) SpO2:  [89 %-99 %] 93 % (08/25 0600) Arterial Line BP: (110-179)/(61-78) 110/61 (08/25 0600) FiO2 (%):  [50 %] 50 % (08/25 0325)  Hemodynamic parameters for last 24 hours:    Intake/Output from previous day: 08/24 0701 - 08/25 0700 In: 2539.9 [I.V.:586.1; NG/GT:1222.5; IV Piggyback:731.3] Out: 6045 [Urine:6525; Stool:150]  Intake/Output this shift: No intake/output data recorded.  Vent settings for last 24 hours: Vent Mode: PRVC FiO2 (%):  [50 %] 50 % Set Rate:  [30 bmp] 30 bmp Vt Set:  [460 mL] 460 mL PEEP:  [16 cmH20] 16 cmH20 Plateau Pressure:  [28 cmH20-30 cmH20] 30 cmH20  Physical Exam:  General: on vent Neuro: paralytic HEENT/Neck: ETT Resp: coarse B CVS: regular rate and rhythm, S1, S2 normal, no murmur, click, rub or gallop GI: soft, mild dist Extremities: mild edema  Results for orders placed or performed during the hospital encounter of 02/15/21 (from the past 24 hour(s))  Glucose, capillary     Status: Abnormal  Collection Time: 03/02/21  8:08 AM  Result Value Ref Range   Glucose-Capillary 131 (H) 70 - 99 mg/dL  I-STAT 7, (LYTES, BLD GAS, ICA, H+H)     Status: Abnormal   Collection Time: 03/02/21  8:43 AM  Result Value Ref Range   pH, Arterial 7.388 7.350 - 7.450   pCO2 arterial 91.5 (HH) 32.0 - 48.0 mmHg   pO2, Arterial 67 (L) 83.0 - 108.0 mmHg   Bicarbonate  55.2 (H) 20.0 - 28.0 mmol/L   TCO2 >50 (H) 22 - 32 mmol/L   O2 Saturation 91.0 %   Acid-Base Excess 26.0 (H) 0.0 - 2.0 mmol/L   Sodium 142 135 - 145 mmol/L   Potassium 3.9 3.5 - 5.1 mmol/L   Calcium, Ion 1.18 1.15 - 1.40 mmol/L   HCT 32.0 (L) 39.0 - 52.0 %   Hemoglobin 10.9 (L) 13.0 - 17.0 g/dL   Patient temperature 46.5 F    Collection site Web designer by RT    Sample type ARTERIAL    Comment NOTIFIED PHYSICIAN   Glucose, capillary     Status: Abnormal   Collection Time: 03/02/21 11:55 AM  Result Value Ref Range   Glucose-Capillary 130 (H) 70 - 99 mg/dL  I-STAT 7, (LYTES, BLD GAS, ICA, H+H)     Status: Abnormal   Collection Time: 03/02/21 12:34 PM  Result Value Ref Range   pH, Arterial 7.461 (H) 7.350 - 7.450   pCO2 arterial 81.2 (HH) 32.0 - 48.0 mmHg   pO2, Arterial 125 (H) 83.0 - 108.0 mmHg   Bicarbonate 57.8 (H) 20.0 - 28.0 mmol/L   TCO2 >50 (H) 22 - 32 mmol/L   O2 Saturation 99.0 %   Acid-Base Excess 30.0 (H) 0.0 - 2.0 mmol/L   Sodium 140 135 - 145 mmol/L   Potassium 3.5 3.5 - 5.1 mmol/L   Calcium, Ion 1.14 (L) 1.15 - 1.40 mmol/L   HCT 29.0 (L) 39.0 - 52.0 %   Hemoglobin 9.9 (L) 13.0 - 17.0 g/dL   Patient temperature 03.5 F    Collection site Web designer by RT    Sample type ARTERIAL    Comment NOTIFIED PHYSICIAN   Low molecular wgt heparin (fractionated)     Status: None   Collection Time: 03/02/21  2:25 PM  Result Value Ref Range   Heparin LMW 0.78 IU/mL  Glucose, capillary     Status: Abnormal   Collection Time: 03/02/21  3:57 PM  Result Value Ref Range   Glucose-Capillary 106 (H) 70 - 99 mg/dL  Glucose, capillary     Status: Abnormal   Collection Time: 03/02/21  7:29 PM  Result Value Ref Range   Glucose-Capillary 126 (H) 70 - 99 mg/dL  Glucose, capillary     Status: Abnormal   Collection Time: 03/02/21 11:24 PM  Result Value Ref Range   Glucose-Capillary 121 (H) 70 - 99 mg/dL  I-STAT 7, (LYTES, BLD GAS, ICA, H+H)     Status: Abnormal    Collection Time: 03/03/21  3:27 AM  Result Value Ref Range   pH, Arterial 7.401 7.350 - 7.450   pCO2 arterial 90.6 (HH) 32.0 - 48.0 mmHg   pO2, Arterial 64 (L) 83.0 - 108.0 mmHg   Bicarbonate 56.3 (H) 20.0 - 28.0 mmol/L   TCO2 >50 (H) 22 - 32 mmol/L   O2 Saturation 90.0 %   Acid-Base Excess 27.0 (H) 0.0 - 2.0 mmol/L   Sodium 141 135 - 145  mmol/L   Potassium 3.5 3.5 - 5.1 mmol/L   Calcium, Ion 1.18 1.15 - 1.40 mmol/L   HCT 32.0 (L) 39.0 - 52.0 %   Hemoglobin 10.9 (L) 13.0 - 17.0 g/dL   Patient temperature 16.198.6 F    Collection site art line    Drawn by RT    Sample type ARTERIAL    Comment NOTIFIED PHYSICIAN   Glucose, capillary     Status: Abnormal   Collection Time: 03/03/21  3:30 AM  Result Value Ref Range   Glucose-Capillary 123 (H) 70 - 99 mg/dL  Triglycerides     Status: Abnormal   Collection Time: 03/03/21  5:19 AM  Result Value Ref Range   Triglycerides 192 (H) <150 mg/dL  Basic metabolic panel     Status: Abnormal   Collection Time: 03/03/21  5:19 AM  Result Value Ref Range   Sodium 141 135 - 145 mmol/L   Potassium 3.9 3.5 - 5.1 mmol/L   Chloride 85 (L) 98 - 111 mmol/L   CO2 49 (H) 22 - 32 mmol/L   Glucose, Bld 140 (H) 70 - 99 mg/dL   BUN 21 (H) 6 - 20 mg/dL   Creatinine, Ser 0.960.45 (L) 0.61 - 1.24 mg/dL   Calcium 8.9 8.9 - 04.510.3 mg/dL   GFR, Estimated >40>60 >98>60 mL/min   Anion gap 7 5 - 15  CBC     Status: Abnormal   Collection Time: 03/03/21  5:19 AM  Result Value Ref Range   WBC 15.4 (H) 4.0 - 10.5 K/uL   RBC 2.99 (L) 4.22 - 5.81 MIL/uL   Hemoglobin 9.3 (L) 13.0 - 17.0 g/dL   HCT 11.930.4 (L) 14.739.0 - 82.952.0 %   MCV 101.7 (H) 80.0 - 100.0 fL   MCH 31.1 26.0 - 34.0 pg   MCHC 30.6 30.0 - 36.0 g/dL   RDW 56.215.9 (H) 13.011.5 - 86.515.5 %   Platelets 688 (H) 150 - 400 K/uL   nRBC 0.2 0.0 - 0.2 %    Assessment & Plan: Present on Admission:  Traumatic brain injury (HCC)    LOS: 16 days   Additional comments:I reviewed the patient's new clinical lab test results. Marland Kitchen. [facility]    SAH, 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 Conchita ParisNundkumar, keppra x7d for sz ppx, repeat 4V Angio CT of neck 8/21 negative. 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 Continued poor neuro exam this AM. G1 L ICA injury - ASA 325 Facial laceration involving left eyelid - ENT c/s, Dr. Pollyann Kennedyosen, repaired 8/9 Skull fx extending ito L TMJ, L sphenoid sinus, R mastoid; R nasal bone fx - ENT c/s, Dr. Pollyann Kennedyosen, repeat exam when more neurologically appropriate R calf DVT - LMWH Severe ARDS - sat goal 88%, 50% and PEEP 16, paralytic on. Unable to reduce hypercapnia enough to eval for neuro improvement. Continue aggressive diuresis. Prone again today. I will consult CCM.  ID/PNA - Vanc for MRSA PNA, cefepime day 10 of 10 for pseud PNA. Secretions improved AF, WBC 15.4. Bcx 8/18 NGTD, repeat resp cx with same orgs as initial. Monkey pox negative (lesions R forearm).  Hypertensive urgency - on cleviprex gtt, metoprolol to 100 BID L clavicle and L scapula fx - now non-op per Dr. Car[address] rash, scattered abrasions - local wound care Alcohol abuse - CIWA, TOC c/s H/o tobacco use disorder  FEN - cortrak, goal TF, dulcolax supp, metolazone+lasix again today DVT - SCDs,  R calf DVT - LMWH with anti-Xa levels to ensure tx-ic Foley - placed 8/15, incr urecholine, TOV 8/24 Dispo - ICU, prone 10am Consult CCM Critical Care Total Time*: 45 Minutes  Violeta Gelinas, MD, MPH, FACS Trauma & General Surgery Use [email or web address] to contact on call provider  03/03/2021  *Care during the described time interval was provided by me. I have reviewed this patient's available data, including medical history, events of note, physical examination and test results as part of my evaluation.

## 2021-03-03 NOTE — Progress Notes (Signed)
Patient's head was turned to the left at 12:30 with 2 RN's assisting without any complications.

## 2021-03-03 NOTE — Progress Notes (Signed)
Patient's head turned to the left by 2 RT's and 2 RN's. ETT remained secured. No complications noted.

## 2021-03-03 NOTE — Progress Notes (Signed)
Patient placed in supine position at 0200 per protocol. Patient tolerated procedure well. No complications noted. Pt's face was cleaned and cloth tape securing ETT was replaced with ETT holder. Bilateral breath sounds are equal. VT are adequate and match ventilator settings. RT will obtain ABG in 1 hour.

## 2021-03-03 NOTE — Progress Notes (Signed)
100 mL versed wasted in stericycle w/ Amy Duffy Rhody RN

## 2021-03-03 NOTE — Progress Notes (Signed)
Pt head turned to the left by RT x1 and RN x2. Suction catheter able to pass and ETT secured. RT will cont to monitor.

## 2021-03-04 ENCOUNTER — Inpatient Hospital Stay (HOSPITAL_COMMUNITY): Payer: 59

## 2021-03-04 LAB — BASIC METABOLIC PANEL
Anion gap: 14 (ref 5–15)
BUN: 27 mg/dL — ABNORMAL HIGH (ref 6–20)
CO2: 47 mmol/L — ABNORMAL HIGH (ref 22–32)
Calcium: 9.7 mg/dL (ref 8.9–10.3)
Chloride: 80 mmol/L — ABNORMAL LOW (ref 98–111)
Creatinine, Ser: 0.58 mg/dL — ABNORMAL LOW (ref 0.61–1.24)
GFR, Estimated: >60 mL/min (ref >60)
Glucose, Bld: 138 mg/dL — ABNORMAL HIGH (ref 70–99)
Potassium: 4.4 mmol/L (ref 3.5–5.1)
Sodium: 141 mmol/L (ref 135–145)

## 2021-03-04 LAB — POCT I-STAT 7, (LYTES, BLD GAS, ICA,H+H)
Acid-Base Excess: 27 mmol/L — ABNORMAL HIGH (ref 0.0–2.0)
Acid-Base Excess: 26 mmol/L — ABNORMAL HIGH (ref 0.0–2.0)
Bicarbonate: 57.5 mmol/L — ABNORMAL HIGH (ref 20.0–28.0)
Bicarbonate: 56.2 mmol/L — ABNORMAL HIGH (ref 20.0–28.0)
Calcium, Ion: 1.17 mmol/L (ref 1.15–1.40)
Calcium, Ion: 1.13 mmol/L — ABNORMAL LOW (ref 1.15–1.40)
HCT: 37 % — ABNORMAL LOW (ref 39.0–52.0)
HCT: 32 % — ABNORMAL LOW (ref 39.0–52.0)
Hemoglobin: 12.6 g/dL — ABNORMAL LOW (ref 13.0–17.0)
Hemoglobin: 10.9 g/dL — ABNORMAL LOW (ref 13.0–17.0)
O2 Saturation: 91 %
O2 Saturation: 98 %
Patient temperature: 98.7
Patient temperature: 99.1
Potassium: 4.2 mmol/L (ref 3.5–5.1)
Potassium: 3.6 mmol/L (ref 3.5–5.1)
Sodium: 139 mmol/L (ref 135–145)
Sodium: 140 mmol/L (ref 135–145)
TCO2: >50 mmol/L — ABNORMAL HIGH (ref 22–32)
TCO2: >50 mmol/L — ABNORMAL HIGH (ref 22–32)
pCO2 arterial: 91.8 mmHg (ref 32.0–48.0)
pCO2 arterial: 93.5 mmHg (ref 32.0–48.0)
pH, Arterial: 7.405 (ref 7.350–7.450)
pH, Arterial: 7.388 (ref 7.350–7.450)
pO2, Arterial: 67 mmHg — ABNORMAL LOW (ref 83.0–108.0)
pO2, Arterial: 123 mmHg — ABNORMAL HIGH (ref 83.0–108.0)

## 2021-03-04 LAB — CBC
HCT: 34.6 % — ABNORMAL LOW (ref 39.0–52.0)
Hemoglobin: 10.7 g/dL — ABNORMAL LOW (ref 13.0–17.0)
MCH: 31 pg (ref 26.0–34.0)
MCHC: 30.9 g/dL (ref 30.0–36.0)
MCV: 100.3 fL — ABNORMAL HIGH (ref 80.0–100.0)
Platelets: 791 10*3/uL — ABNORMAL HIGH (ref 150–400)
RBC: 3.45 MIL/uL — ABNORMAL LOW (ref 4.22–5.81)
RDW: 15.9 % — ABNORMAL HIGH (ref 11.5–15.5)
WBC: 19.9 10*3/uL — ABNORMAL HIGH (ref 4.0–10.5)
nRBC: 0.1 % (ref 0.0–0.2)

## 2021-03-04 LAB — GLUCOSE, CAPILLARY
Glucose-Capillary: 134 mg/dL — ABNORMAL HIGH (ref 70–99)
Glucose-Capillary: 138 mg/dL — ABNORMAL HIGH (ref 70–99)
Glucose-Capillary: 141 mg/dL — ABNORMAL HIGH (ref 70–99)
Glucose-Capillary: 147 mg/dL — ABNORMAL HIGH (ref 70–99)
Glucose-Capillary: 131 mg/dL — ABNORMAL HIGH (ref 70–99)
Glucose-Capillary: 153 mg/dL — ABNORMAL HIGH (ref 70–99)

## 2021-03-04 MED ORDER — METOLAZONE 5 MG PO TABS
10.0000 mg | ORAL_TABLET | Freq: Once | ORAL | Status: AC
  Administered 2021-03-04: 10 mg
  Filled 2021-03-04: qty 2

## 2021-03-04 MED ORDER — PHENYLEPHRINE HCL-NACL 20-0.9 MG/250ML-% IV SOLN
0.0000 ug/min | INTRAVENOUS | Status: DC
  Administered 2021-03-04: 20 ug/min via INTRAVENOUS
  Administered 2021-03-04 – 2021-03-05 (×2): 50 ug/min via INTRAVENOUS
  Filled 2021-03-04 (×4): qty 250

## 2021-03-04 MED ORDER — ACETAZOLAMIDE 250 MG PO TABS
500.0000 mg | ORAL_TABLET | Freq: Two times a day (BID) | ORAL | Status: AC
  Administered 2021-03-04 – 2021-03-05 (×4): 500 mg
  Filled 2021-03-04 (×4): qty 2

## 2021-03-04 NOTE — Progress Notes (Signed)
Repositioned patient from supine to prone at 0145.  Since then patient's has been hypotenisve (Aline reading 90/52 (64)), Tachycardic (HR 136).  Also, RN drew ABG and had a critical PCO2 of 91.8. Called Trauma MD Kinsinger and alerted him to these vital signs.  Was not given new orders for the time being but was told to alert MD if these vital signs decline further.

## 2021-03-04 NOTE — Progress Notes (Signed)
Pt head turned to the right by RT x1 and RN x2. Suction catheter able to pass and ETT secured. RT will cont to monitor.

## 2021-03-04 NOTE — Progress Notes (Signed)
Patient ID: Scott [name], male   DOB: 1986/04/17, 35 y.o.   MRN: [id number] Follow up - Trauma Critical Care  Patient Details:    Scott [name] is an 35 y.o. male.  Lines/tubes : Airway 8 mm (Active)  Secured at (cm) 24 cm 03/04/21 0400  Measured From Lips 03/04/21 0400  Secured Location Left 03/04/21 0335  Secured By [employer] 03/04/21 0335  Tube Holder Repositioned Yes 03/04/21 0335  Prone position No 03/04/21 0335  Head position Left 03/03/21 2344  Cuff Pressure (cm H2O) Green OR 18-26 CmH2O 03/03/21 1959  Site Condition Dry 03/04/21 0335     PICC Triple Lumen 02/20/21 PICC Right Basilic 39 cm 0 cm (Active)  Indication for Insertion or Continuance of Line Limited venous access - need for IV therapy >5 days (PICC only) 03/03/21 2000  Exposed Catheter (cm) 1 cm 02/27/21 1710  Site Assessment Clean;Dry;Intact 03/03/21 2000  Lumen #1 Status Infusing 03/03/21 2000  Lumen #2 Status Infusing 03/03/21 2000  Lumen #3 Status Flushed;Saline locked 03/03/21 2000  Dressing Type Transparent;Securing device 03/03/21 2000  Dressing Status Clean;Dry;Intact 03/03/21 2000  Antimicrobial disc in place? Yes 03/03/21 2000  Safety Lock Not Applicable 03/03/21 2000  Line Care Lumen 3 tubing changed;Connections checked and tightened 03/02/21 2000  Line Adjustment (NICU/IV Team Only) No 03/03/21 0800  Dressing Intervention Dressing changed;Antimicrobial disc changed 02/27/21 1710  Dressing Change Due 03/06/21 03/03/21 2000     Arterial Line 02/21/21 Left Radial (Active)  Site Assessment Clean;Intact;Dry 03/03/21 2000  Line Status Pulsatile blood flow 03/03/21 2000  Art Line Waveform Appropriate 03/03/21 2000  Art Line Interventions Zeroed and calibrated;Leveled;Connections checked and tightened;Flushed per protocol 03/02/21 2000  Color/Movement/Sensation Capillary refill less than 3 sec 03/03/21 2000  Dressing Type Transparent 03/03/21 2000  Dressing Status Other (Comment)  03/03/21 0236  Interventions Dressing changed;Dressing reinforced;Antimicrobial disc changed;New dressing 03/03/21 0236  Dressing Change Due 03/08/21 03/03/21 2000     NG/OG Vented/Dual Lumen Orogastric 16 Fr. Oral (Active)  Tube Position (Required) External length of tube 03/03/21 2000  Measurement (cm) (Required) 55 cm 03/03/21 2000  Ongoing Placement Verification (Required) (See row information) Yes 03/03/21 2000  Site Assessment Clean;Dry;Intact 03/03/21 2000  Interventions Cleansed;Retaped 02/26/21 2000  Status Clamped 03/03/21 2000  Drainage Appearance Tan 02/23/21 2000  Output (mL) 0 mL 02/25/21 2200     Flatus Tube/Pouch (Active)  Daily care Skin around tube assessed 03/03/21 2000  Output (mL) 0 mL 03/04/21 0600     Urethral Catheter [name], RN Non-latex 16 Fr. (Active)  Indication for Insertion or Continuance of Catheter Chemically paralyzed patients 03/03/21 2000  Site Assessment Clean;Intact 03/03/21 2000  Catheter Maintenance Bag below level of bladder;Drainage bag/tubing not touching floor;Catheter secured;Insertion date on drainage bag;No dependent loops;Seal intact 03/03/21 2000  Collection Container Standard drainage bag 03/03/21 2000  Securement Method Securing device (Describe) 03/03/21 2000  Urinary Catheter Interventions (if applicable) Unclamped 03/03/21 2000  Output (mL) 40 mL 03/04/21 0600    Microbiology/Sepsis markers: Results for orders placed or performed during the hospital encounter of 02/15/21  Resp Panel by RT-PCR (Flu A&B, Covid) Nasopharyngeal Swab     Status: None   Collection Time: 02/15/21  8:14 PM   Specimen: Nasopharyngeal Swab; Nasopharyngeal(NP) swabs in vial transport medium  Result Value Ref Range Status   SARS Coronavirus 2 by RT PCR NEGATIVE NEGATIVE Final    Comment: (NOTE) SARS-CoV-2 target nucleic acids are NOT DETECTED.  The SARS-CoV-2 RNA is generally detectable in  upper respiratory specimens during the acute phase of  infection. The lowest concentration of SARS-CoV-2 viral copies this assay can detect is 138 copies/mL. A negative result does not preclude SARS-Cov-2 infection and should not be used as the sole basis for treatment or other patient management decisions. A negative result may occur with  improper specimen collection/handling, submission of specimen other than nasopharyngeal swab, presence of viral mutation(s) within the areas targeted by this assay, and inadequate number of viral copies(<138 copies/mL). A negative result must be combined with clinical observations, patient history, and epidemiological information. The expected result is Negative.  Fact Sheet for Patients:  BloggerCourse.comhttps://www.fda.gov/media/152166/download  Fact Sheet for Healthcare Providers:  SeriousBroker.ithttps://www.fda.gov/media/152162/download  This test is no t yet approved or cleared by the [place] FDA and  has been authorized for detection and/or diagnosis of SARS-CoV-2 by FDA under an Emergency Use Authorization (EUA). This EUA will remain  in effect (meaning this test can be used) for the duration of the COVID-19 declaration under Section 564(b)(1) of the Act, 21 U.S.C.section 360bbb-3(b)(1), unless the authorization is terminated  or revoked sooner.       Influenza A by PCR NEGATIVE NEGATIVE Final   Influenza B by PCR NEGATIVE NEGATIVE Final    Comment: (NOTE) The Xpert Xpress SARS-CoV-2/FLU/RSV plus assay is intended as an aid in the diagnosis of influenza from Nasopharyngeal swab specimens and should not be used as a sole basis for treatment. Nasal washings and aspirates are unacceptable for Xpert Xpress SARS-CoV-2/FLU/RSV testing.  Fact Sheet for Patients: BloggerCourse.comhttps://www.fda.gov/media/152166/download  Fact Sheet for Healthcare Providers: SeriousBroker.ithttps://www.fda.gov/media/152162/download  This test is not yet approved or cleared by the [place] FDA and has been authorized for detection and/or diagnosis of SARS-CoV-2  by FDA under an Emergency Use Authorization (EUA). This EUA will remain in effect (meaning this test can be used) for the duration of the COVID-19 declaration under Section 564(b)(1) of the Act, 21 U.S.C. section 360bbb-3(b)(1), unless the authorization is terminated or revoked.  Performed at [facility], [address] [place], [place] [address]   MRSA Next Gen by PCR, Nasal     Status: None   Collection Time: 02/15/21  9:58 PM   Specimen: Nasal Mucosa; Nasal Swab  Result Value Ref Range Status   MRSA by PCR Next Gen NOT DETECTED NOT DETECTED Final    Comment: (NOTE) The GeneXpert MRSA Assay (FDA approved for NASAL specimens only), is one component of a comprehensive MRSA colonization surveillance program. It is not intended to diagnose MRSA infection nor to guide or monitor treatment for MRSA infections. Test performance is not FDA approved in patients less than 36 years old. Performed at [facility], [address] [place], [place] [address]   Culture, Respiratory w Gram Stain     Status: None   Collection Time: 02/17/21  2:04 PM   Specimen: Tracheal Aspirate; Respiratory  Result Value Ref Range Status   Specimen Description TRACHEAL ASPIRATE  Final   Special Requests NONE  Final   Gram Stain   Final    ABUNDANT WBC PRESENT,BOTH PMN AND MONONUCLEAR ABUNDANT GRAM NEGATIVE RODS MODERATE GRAM POSITIVE COCCI RARE GRAM POSITIVE RODS    Culture   Final    FEW Normal respiratory flora-no Staph aureus or Pseudomonas seen Performed at [facility], [address] [place], [place] [address]    Report Status 02/19/2021 FINAL  Final  Culture, blood (routine x 2)     Status: None  Collection Time: 02/17/21  2:24 PM   Specimen: BLOOD  Result Value Ref Range Status   Specimen Description BLOOD BLOOD RIGHT FOREARM  Final   Special Requests   Final    BOTTLES DRAWN AEROBIC AND ANAEROBIC Blood Culture adequate volume   Culture   Final    NO GROWTH 5  DAYS Performed at [facility], [address] [place], [place] [address]    Report Status 02/22/2021 FINAL  Final  Culture, blood (routine x 2)     Status: None   Collection Time: 02/17/21  3:01 PM   Specimen: BLOOD  Result Value Ref Range Status   Specimen Description BLOOD LEFT ANTECUBITAL  Final   Special Requests   Final    BOTTLES DRAWN AEROBIC AND ANAEROBIC Blood Culture adequate volume   Culture   Final    NO GROWTH 5 DAYS Performed at [facility], [address] [place], [place] [address]    Report Status 02/22/2021 FINAL  Final  Culture, Respiratory w Gram Stain     Status: None   Collection Time: 02/20/21 12:49 PM   Specimen: Tracheal Aspirate; Respiratory  Result Value Ref Range Status   Specimen Description TRACHEAL ASPIRATE  Final   Special Requests NONE  Final   Gram Stain   Final    MODERATE WBC PRESENT,BOTH PMN AND MONONUCLEAR ABUNDANT GRAM POSITIVE COCCI IN PAIRS ABUNDANT GRAM NEGATIVE RODS Performed at [facility], [address] [place], [place] [address]    Culture   Final    ABUNDANT METHICILLIN RESISTANT STAPHYLOCOCCUS AUREUS ABUNDANT PSEUDOMONAS AERUGINOSA    Report Status 02/23/2021 FINAL  Final   Organism ID, Bacteria METHICILLIN RESISTANT STAPHYLOCOCCUS AUREUS  Final   Organism ID, Bacteria PSEUDOMONAS AERUGINOSA  Final      Susceptibility   Methicillin resistant staphylococcus aureus - MIC*    CIPROFLOXACIN <=0.5 SENSITIVE Sensitive     ERYTHROMYCIN <=0.25 SENSITIVE Sensitive     GENTAMICIN <=0.5 SENSITIVE Sensitive     OXACILLIN >=4 RESISTANT Resistant     TETRACYCLINE <=1 SENSITIVE Sensitive     VANCOMYCIN 1 SENSITIVE Sensitive     TRIMETH/SULFA <=10 SENSITIVE Sensitive     CLINDAMYCIN <=0.25 SENSITIVE Sensitive     RIFAMPIN <=0.5 SENSITIVE Sensitive     Inducible Clindamycin NEGATIVE Sensitive     * ABUNDANT METHICILLIN RESISTANT STAPHYLOCOCCUS AUREUS   Pseudomonas aeruginosa - MIC*    CEFTAZIDIME 4 SENSITIVE  Sensitive     CIPROFLOXACIN 0.5 SENSITIVE Sensitive     GENTAMICIN <=1 SENSITIVE Sensitive     IMIPENEM 2 SENSITIVE Sensitive     PIP/TAZO 8 SENSITIVE Sensitive     CEFEPIME 2 SENSITIVE Sensitive     * ABUNDANT PSEUDOMONAS AERUGINOSA  Monkeypox Virus DNA, Qualitative Real-Time PCR     Status: None   Collection Time: 02/23/21  1:42 AM   Specimen: Arm, Right; Sterile Swab  Result Value Ref Range Status   Orthopoxvirus DNA, QL PCR NOT DETECTED NOT DETECTED Final    Comment: (NOTE) Non-variola Orthopoxvirus DNA not detected by real-time PCR. Performed At: [facility] [address] [place], [place] [id number] Jolene Schimke MD UY:[id number]   Culture, Respiratory w Gram Stain     Status: None   Collection Time: 02/23/21 10:05 AM   Specimen: Tracheal Aspirate; Respiratory  Result Value Ref Range Status   Specimen Description TRACHEAL ASPIRATE  Final   Special Requests NONE  Final   Gram Stain   Final  ABUNDANT WBC PRESENT, PREDOMINANTLY PMN FEW GRAM NEGATIVE RODS RARE GRAM POSITIVE COCCI Performed at [facility], [address] [place], [place] [address]    Culture   Final    MODERATE PSEUDOMONAS AERUGINOSA RARE METHICILLIN RESISTANT STAPHYLOCOCCUS AUREUS    Report Status 02/26/2021 FINAL  Final   Organism ID, Bacteria PSEUDOMONAS AERUGINOSA  Final   Organism ID, Bacteria METHICILLIN RESISTANT STAPHYLOCOCCUS AUREUS  Final      Susceptibility   Methicillin resistant staphylococcus aureus - MIC*    CIPROFLOXACIN <=0.5 SENSITIVE Sensitive     ERYTHROMYCIN <=0.25 SENSITIVE Sensitive     GENTAMICIN <=0.5 SENSITIVE Sensitive     OXACILLIN >=4 RESISTANT Resistant     TETRACYCLINE <=1 SENSITIVE Sensitive     VANCOMYCIN <=0.5 SENSITIVE Sensitive     TRIMETH/SULFA <=10 SENSITIVE Sensitive     CLINDAMYCIN <=0.25 SENSITIVE Sensitive     RIFAMPIN <=0.5 SENSITIVE Sensitive     Inducible Clindamycin NEGATIVE Sensitive     * RARE METHICILLIN RESISTANT STAPHYLOCOCCUS  AUREUS   Pseudomonas aeruginosa - MIC*    CEFTAZIDIME 4 SENSITIVE Sensitive     CIPROFLOXACIN <=0.25 SENSITIVE Sensitive     GENTAMICIN <=1 SENSITIVE Sensitive     IMIPENEM 1 SENSITIVE Sensitive     PIP/TAZO 8 SENSITIVE Sensitive     CEFEPIME 2 SENSITIVE Sensitive     * MODERATE PSEUDOMONAS AERUGINOSA  Culture, blood (Routine X 2) w Reflex to ID Panel     Status: None   Collection Time: 02/24/21 10:53 AM   Specimen: BLOOD RIGHT HAND  Result Value Ref Range Status   Specimen Description BLOOD RIGHT HAND  Final   Special Requests   Final    BOTTLES DRAWN AEROBIC AND ANAEROBIC Blood Culture adequate volume   Culture   Final    NO GROWTH 5 DAYS Performed at [facility], [address] [place], [place] [address]    Report Status 03/01/2021 FINAL  Final  Culture, blood (Routine X 2) w Reflex to ID Panel     Status: None   Collection Time: 02/24/21 10:58 AM   Specimen: BLOOD LEFT HAND  Result Value Ref Range Status   Specimen Description BLOOD LEFT HAND  Final   Special Requests   Final    BOTTLES DRAWN AEROBIC AND ANAEROBIC Blood Culture adequate volume   Culture   Final    NO GROWTH 5 DAYS Performed at [facility], [address] [place], [place] [address]    Report Status 03/01/2021 FINAL  Final  SARS CORONAVIRUS 2 (TAT 6-24 HRS) Nasopharyngeal Nasopharyngeal Swab     Status: None   Collection Time: 02/25/21  9:20 AM   Specimen: Nasopharyngeal Swab  Result Value Ref Range Status   SARS Coronavirus 2 NEGATIVE NEGATIVE Final    Comment: (NOTE) SARS-CoV-2 target nucleic acids are NOT DETECTED.  The SARS-CoV-2 RNA is generally detectable in upper and lower respiratory specimens during the acute phase of infection. Negative results do not preclude SARS-CoV-2 infection, do not rule out co-infections with other pathogens, and should not be used as the sole basis for treatment or other patient management decisions. Negative results must be combined with clinical  observations, patient history, and epidemiological information. The expected result is Negative.  Fact Sheet for Patients: HairSlick.no  Fact Sheet for Healthcare Providers: quierodirigir.com  This test is not yet approved or cleared by the [place] FDA and  has been authorized for detection and/or diagnosis of SARS-CoV-2 by FDA  under an Emergency Use Authorization (EUA). This EUA will remain  in effect (meaning this test can be used) for the duration of the COVID-19 declaration under Se ction 564(b)(1) of the Act, 21 U.S.C. section 360bbb-3(b)(1), unless the authorization is terminated or revoked sooner.  Performed at [facility], [address] [place], [place] [address]     Anti-infectives:  Anti-infectives (From admission, onward)    Start     Dose/Rate Route Frequency Ordered Stop   02/24/21 1200  vancomycin (VANCOCIN) IVPB 1000 mg/200 mL premix        1,000 mg 200 mL/hr over 60 Minutes Intravenous Every 8 hours 02/24/21 0934 03/02/21 2020   02/23/21 0200  vancomycin (VANCOREADY) IVPB 1500 mg/300 mL  Status:  Discontinued        1,500 mg 150 mL/hr over 120 Minutes Intravenous Every 8 hours 02/22/21 1931 02/24/21 0934   02/22/21 2100  vancomycin (VANCOCIN) 250 mg in sodium chloride 0.9 % 100 mL IVPB        250 mg 100 mL/hr over 60 Minutes Intravenous  Once 02/22/21 2012 02/22/21 2150   02/22/21 2030  vancomycin (VANCOCIN) 250 mg in sodium chloride 0.9 % 500 mL IVPB  Status:  Discontinued        250 mg 250 mL/hr over 120 Minutes Intravenous  Once 02/22/21 1931 02/22/21 2012   02/21/21 1800  vancomycin (VANCOREADY) IVPB 1250 mg/250 mL  Status:  Discontinued        1,250 mg 166.7 mL/hr over 90 Minutes Intravenous Every 8 hours 02/21/21 0851 02/22/21 1931   02/21/21 1000  ceFEPIme (MAXIPIME) 2 g in sodium chloride 0.9 % 100 mL IVPB        2 g 200 mL/hr over 30 Minutes Intravenous Every 8 hours 02/21/21 0851  03/02/21 1749   02/21/21 1000  vancomycin (VANCOREADY) IVPB 2000 mg/400 mL        2,000 mg 200 mL/hr over 120 Minutes Intravenous  Once 02/21/21 0851 02/21/21 1306       Best Practice/Protocols:  VTE Prophylaxis: Lovenox (full dose) Continous Sedation  Consults: Treatment Team:  Serena Colonel, MD Lisbeth Renshaw, MD Myrene Galas, MD    Studies:    Events:  Subjective:    Overnight Issues:   Objective:  Vital signs for last 24 hours: Temp:  [98.3 F (36.8 C)-99.1 F (37.3 C)] 99.1 F (37.3 C) (08/26 0400) Pulse Rate:  [95-142] 142 (08/26 0600) Resp:  [25-30] 30 (08/26 0600) BP: (83-164)/(48-98) 113/69 (08/26 0600) SpO2:  [88 %-95 %] 89 % (08/26 0600) Arterial Line BP: (83-160)/(49-78) 100/58 (08/26 0600) FiO2 (%):  [50 %] 50 % (08/26 0335) Weight:  [70.7 kg] 70.7 kg (08/26 0500)  Hemodynamic parameters for last 24 hours:    Intake/Output from previous day: 08/25 0701 - 08/26 0700 In: 2711 [I.V.:1611; NG/GT:1100] Out: 7280 [Urine:7230; Stool:50]  Intake/Output this shift: No intake/output data recorded.  Vent settings for last 24 hours: Vent Mode: PRVC FiO2 (%):  [50 %] 50 % Set Rate:  [30 bmp] 30 bmp Vt Set:  [460 mL] 460 mL PEEP:  [16 cmH20] 16 cmH20 Plateau Pressure:  [18 cmH20-30 cmH20] 19 cmH20  Physical Exam:  General: on vent Neuro: sedated HEENT/Neck: ETT Resp: clear to auscultation bilaterally CVS: RRR GI: softer NT Extremities: no edema, no erythema, pulses WNL Correct - lungs coarse  Results for orders placed or performed during the hospital encounter of 02/15/21 (from the past 24 hour(s))  Glucose, capillary  Status: Abnormal   Collection Time: 03/03/21 11:13 AM  Result Value Ref Range   Glucose-Capillary 113 (H) 70 - 99 mg/dL  I-STAT 7, (LYTES, BLD GAS, ICA, H+H)     Status: Abnormal   Collection Time: 03/03/21  3:41 PM  Result Value Ref Range   pH, Arterial 7.460 (H) 7.350 - 7.450   pCO2 arterial 82.6 (HH) 32.0 - 48.0  mmHg   pO2, Arterial 77 (L) 83.0 - 108.0 mmHg   Bicarbonate 58.8 (H) 20.0 - 28.0 mmol/L   TCO2 >50 (H) 22 - 32 mmol/L   O2 Saturation 95.0 %   Acid-Base Excess 30.0 (H) 0.0 - 2.0 mmol/L   Sodium 140 135 - 145 mmol/L   Potassium 3.7 3.5 - 5.1 mmol/L   Calcium, Ion 1.18 1.15 - 1.40 mmol/L   HCT 33.0 (L) 39.0 - 52.0 %   Hemoglobin 11.2 (L) 13.0 - 17.0 g/dL   Sample type ARTERIAL    Comment NOTIFIED PHYSICIAN   Glucose, capillary     Status: Abnormal   Collection Time: 03/03/21  3:44 PM  Result Value Ref Range   Glucose-Capillary 115 (H) 70 - 99 mg/dL  Glucose, capillary     Status: Abnormal   Collection Time: 03/03/21  7:39 PM  Result Value Ref Range   Glucose-Capillary 154 (H) 70 - 99 mg/dL  Glucose, capillary     Status: Abnormal   Collection Time: 03/03/21 11:37 PM  Result Value Ref Range   Glucose-Capillary 167 (H) 70 - 99 mg/dL  I-STAT 7, (LYTES, BLD GAS, ICA, H+H)     Status: Abnormal   Collection Time: 03/04/21  2:41 AM  Result Value Ref Range   pH, Arterial 7.405 7.350 - 7.450   pCO2 arterial 91.8 (HH) 32.0 - 48.0 mmHg   pO2, Arterial 67 (L) 83.0 - 108.0 mmHg   Bicarbonate 57.5 (H) 20.0 - 28.0 mmol/L   TCO2 >50 (H) 22 - 32 mmol/L   O2 Saturation 91.0 %   Acid-Base Excess 27.0 (H) 0.0 - 2.0 mmol/L   Sodium 139 135 - 145 mmol/L   Potassium 4.2 3.5 - 5.1 mmol/L   Calcium, Ion 1.17 1.15 - 1.40 mmol/L   HCT 37.0 (L) 39.0 - 52.0 %   Hemoglobin 12.6 (L) 13.0 - 17.0 g/dL   Patient temperature 13.0 F    Sample type ARTERIAL   Glucose, capillary     Status: Abnormal   Collection Time: 03/04/21  3:43 AM  Result Value Ref Range   Glucose-Capillary 134 (H) 70 - 99 mg/dL  Basic metabolic panel     Status: Abnormal   Collection Time: 03/04/21  4:04 AM  Result Value Ref Range   Sodium 141 135 - 145 mmol/L   Potassium 4.4 3.5 - 5.1 mmol/L   Chloride 80 (L) 98 - 111 mmol/L   CO2 47 (H) 22 - 32 mmol/L   Glucose, Bld 138 (H) 70 - 99 mg/dL   BUN 27 (H) 6 - 20 mg/dL    Creatinine, Ser 8.65 (L) 0.61 - 1.24 mg/dL   Calcium 9.7 8.9 - 78.4 mg/dL   GFR, Estimated >69 >62 mL/min   Anion gap 14 5 - 15  CBC     Status: Abnormal   Collection Time: 03/04/21  4:04 AM  Result Value Ref Range   WBC 19.9 (H) 4.0 - 10.5 K/uL   RBC 3.45 (L) 4.22 - 5.81 MIL/uL   Hemoglobin 10.7 (L) 13.0 - 17.0 g/dL   HCT 95.2 (L)  39.0 - 52.0 %   MCV 100.3 (H) 80.0 - 100.0 fL   MCH 31.0 26.0 - 34.0 pg   MCHC 30.9 30.0 - 36.0 g/dL   RDW 29.7 (H) 98.9 - 21.1 %   Platelets 791 (H) 150 - 400 K/uL   nRBC 0.1 0.0 - 0.2 %    Assessment & Plan: Present on Admission:  Traumatic brain injury (HCC)    LOS: 17 days   Additional comments:I reviewed the patient's new clinical lab test results. And CXR [facility]   SAH, 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 Conchita Paris, keppra x7d for sz ppx, repeat 4V Angio CT of neck 8/21 negative. 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 Assess off paralytic. G1 L ICA injury - ASA 325 Facial laceration involving left eyelid - ENT c/s, Dr. Pollyann Kennedy, repaired 8/9 Skull fx extending ito L TMJ, L sphenoid sinus, R mastoid; R nasal bone fx - ENT c/s, Dr. Pollyann Kennedy, repeat exam when more neurologically appropriate R calf DVT - LMWH Severe ARDS - sat goal 88%, 50% and PEEP 16, paralytic off. Unable to reduce hypercapnia enough to eval for neuro improvement. Continue aggressive diuresis. Plan is prone again today but will D/W CCM ID/PNA - Vanc for MRSA PNA, completed cefepime for pseud PNA. Secretions improved AF, WBC 19.9 so will repeat resp CX. Monkey pox negative (lesions R forearm).  CV/Hypertensive urgency - off cleviprex gtt, metoprolol to 75 BID - hold for low BP. Now on neo after diuresis L clavicle and L scapula fx - now non-op per Dr. [name] rash, scattered abrasions - local wound care Alcohol abuse - CIWA, TOC c/s H/o tobacco use disorder  FEN - cortrak, goal TF, dulcolax supp, metolazone+lasix again today - discuss  diuresis with CCM as BP now low DVT - SCDs, R calf DVT - LMWH with anti-Xa levels to ensure tx-ic Foley - in as has been on paralytic Dispo - ICU Critical Care Total Time*: 35 Minutes  Violeta Gelinas, MD, MPH, FACS Trauma & General Surgery Use [email or web address] to contact on call provider  03/04/2021  *Care during the described time interval was provided by me. I have reviewed this patient's available data, including medical history, events of note, physical examination and test results as part of my evaluation.

## 2021-03-04 NOTE — Progress Notes (Signed)
eLink Physician-Brief Progress Note Patient Name: Scott [name] DOB: [birth year] MRN: [id number]   Date of Service  03/04/2021  HPI/Events of Note  Patient was un-proned 2 hours ago and is now hypotensive and tachycardic, saturation is 87 %, down from low 90's.  eICU Interventions  Phenylephrine gtt ordered, stat CXR and ABG ordered.        Scott [name] 03/04/2021, 3:50 AM

## 2021-03-04 NOTE — Progress Notes (Signed)
Able to further decrease vent settings from 14 peep to 10 peep. Fio2 remains at 70 but can likely decrease further just giving a bit to ensure that sats remain in mid to high 90's.   Will increase TV to 7cc from 6 and check abg in next 1hours. Will cont to make adustments based on result.   Hopefully will be able to get to reasonable vent settings today 50% 8 peep and can then start attempting to wean sedation.   D/w RN and RT at bedside.

## 2021-03-04 NOTE — Progress Notes (Signed)
eLink Physician-Brief Progress Note Patient Name: Scott [name] DOB: 1986-02-28 MRN: [id number]   Date of Service  03/04/2021  HPI/Events of Note  ABG reviewed.  eICU Interventions  No interventions.        Thomasene Lot Iliyana Convey 03/04/2021, 9:21 PM
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
## 2021-03-04 NOTE — Progress Notes (Signed)
Patient ID: Scott [name], male   DOB: 24-Jun-1986, 35 y.o.   MRN: [id number] I called his significant other and gave her a clinical update and answered her questions.  Violeta Gelinas, MD, MPH, FACS Please use [email or web address] to contact on call provider

## 2021-03-04 NOTE — Progress Notes (Signed)
PT supine by RT x1 and RN x 4. Commercial tube holder in place. ETT stable, pt stable, vitals stable throughout. RT will cont to monitor.

## 2021-03-04 NOTE — Progress Notes (Signed)
Pt tube found at 26 @ the lip, RT notified RN and pulled tube to 24 at the lips as ordered/noted. CXR ordered. RT will cont to monitor.

## 2021-03-04 NOTE — Progress Notes (Signed)
Spoke with Dr. Warrick Parisian from CCM and alerted him to the poor vital signs (Tachycardia, Hypotension, Desaturation) since the patient was repositioned from prone to supine.  Was given orders for Neo for BP and then call back in an hour if vitals continue to decline.  Will continue to assess and act accordingly.

## 2021-03-05 ENCOUNTER — Inpatient Hospital Stay (HOSPITAL_COMMUNITY): Payer: 59

## 2021-03-05 LAB — COMPREHENSIVE METABOLIC PANEL
ALT: 86 U/L — ABNORMAL HIGH (ref 0–44)
AST: 51 U/L — ABNORMAL HIGH (ref 15–41)
Albumin: 2.1 g/dL — ABNORMAL LOW (ref 3.5–5.0)
Alkaline Phosphatase: 247 U/L — ABNORMAL HIGH (ref 38–126)
Anion gap: 11 (ref 5–15)
BUN: 43 mg/dL — ABNORMAL HIGH (ref 6–20)
CO2: 38 mmol/L — ABNORMAL HIGH (ref 22–32)
Calcium: 9.4 mg/dL (ref 8.9–10.3)
Chloride: 92 mmol/L — ABNORMAL LOW (ref 98–111)
Creatinine, Ser: 0.67 mg/dL (ref 0.61–1.24)
GFR, Estimated: >60 mL/min (ref >60)
Glucose, Bld: 120 mg/dL — ABNORMAL HIGH (ref 70–99)
Potassium: 2.5 mmol/L — CL (ref 3.5–5.1)
Sodium: 141 mmol/L (ref 135–145)
Total Bilirubin: 0.9 mg/dL (ref 0.3–1.2)
Total Protein: 7.6 g/dL (ref 6.5–8.1)

## 2021-03-05 LAB — CBC
HCT: 29.7 % — ABNORMAL LOW (ref 39.0–52.0)
Hemoglobin: 9.3 g/dL — ABNORMAL LOW (ref 13.0–17.0)
MCH: 30.9 pg (ref 26.0–34.0)
MCHC: 31.3 g/dL (ref 30.0–36.0)
MCV: 98.7 fL (ref 80.0–100.0)
Platelets: 681 10*3/uL — ABNORMAL HIGH (ref 150–400)
RBC: 3.01 MIL/uL — ABNORMAL LOW (ref 4.22–5.81)
RDW: 15.9 % — ABNORMAL HIGH (ref 11.5–15.5)
WBC: 20.6 10*3/uL — ABNORMAL HIGH (ref 4.0–10.5)
nRBC: 0 % (ref 0.0–0.2)

## 2021-03-05 LAB — GLUCOSE, CAPILLARY
Glucose-Capillary: 111 mg/dL — ABNORMAL HIGH (ref 70–99)
Glucose-Capillary: 115 mg/dL — ABNORMAL HIGH (ref 70–99)
Glucose-Capillary: 117 mg/dL — ABNORMAL HIGH (ref 70–99)
Glucose-Capillary: 119 mg/dL — ABNORMAL HIGH (ref 70–99)
Glucose-Capillary: 127 mg/dL — ABNORMAL HIGH (ref 70–99)
Glucose-Capillary: 140 mg/dL — ABNORMAL HIGH (ref 70–99)

## 2021-03-05 LAB — BASIC METABOLIC PANEL
Anion gap: 11 (ref 5–15)
BUN: 48 mg/dL — ABNORMAL HIGH (ref 6–20)
CO2: 37 mmol/L — ABNORMAL HIGH (ref 22–32)
Calcium: 8.9 mg/dL (ref 8.9–10.3)
Chloride: 94 mmol/L — ABNORMAL LOW (ref 98–111)
Creatinine, Ser: 0.7 mg/dL (ref 0.61–1.24)
GFR, Estimated: >60 mL/min (ref >60)
Glucose, Bld: 133 mg/dL — ABNORMAL HIGH (ref 70–99)
Potassium: 2.6 mmol/L — CL (ref 3.5–5.1)
Sodium: 142 mmol/L (ref 135–145)

## 2021-03-05 LAB — POCT I-STAT 7, (LYTES, BLD GAS, ICA,H+H)
Acid-Base Excess: 18 mmol/L — ABNORMAL HIGH (ref 0.0–2.0)
Bicarbonate: 42.6 mmol/L — ABNORMAL HIGH (ref 20.0–28.0)
Calcium, Ion: 1.17 mmol/L (ref 1.15–1.40)
HCT: 31 % — ABNORMAL LOW (ref 39.0–52.0)
Hemoglobin: 10.5 g/dL — ABNORMAL LOW (ref 13.0–17.0)
O2 Saturation: 93 %
Patient temperature: 99
Potassium: 2.5 mmol/L — CL (ref 3.5–5.1)
Sodium: 141 mmol/L (ref 135–145)
TCO2: 44 mmol/L — ABNORMAL HIGH (ref 22–32)
pCO2 arterial: 49.6 mmHg — ABNORMAL HIGH (ref 32.0–48.0)
pH, Arterial: 7.543 — ABNORMAL HIGH (ref 7.350–7.450)
pO2, Arterial: 60 mmHg — ABNORMAL LOW (ref 83.0–108.0)

## 2021-03-05 LAB — MAGNESIUM: Magnesium: 2.7 mg/dL — ABNORMAL HIGH (ref 1.7–2.4)

## 2021-03-05 LAB — PHOSPHORUS: Phosphorus: 3.8 mg/dL (ref 2.5–4.6)

## 2021-03-05 MED ORDER — POTASSIUM CHLORIDE 10 MEQ/50ML IV SOLN
10.0000 meq | INTRAVENOUS | Status: AC
  Administered 2021-03-05 – 2021-03-06 (×8): 10 meq via INTRAVENOUS
  Filled 2021-03-05 (×9): qty 50

## 2021-03-05 MED ORDER — METOLAZONE 5 MG PO TABS
10.0000 mg | ORAL_TABLET | Freq: Once | ORAL | Status: AC
  Administered 2021-03-05: 10 mg
  Filled 2021-03-05: qty 2

## 2021-03-05 MED ORDER — POTASSIUM CHLORIDE 20 MEQ PO PACK
40.0000 meq | PACK | ORAL | Status: AC
Start: 2021-03-05 — End: 2021-03-05
  Administered 2021-03-05 (×3): 40 meq
  Filled 2021-03-05 (×3): qty 2

## 2021-03-05 MED ORDER — SPIRONOLACTONE 25 MG PO TABS
50.0000 mg | ORAL_TABLET | Freq: Once | ORAL | Status: AC
  Administered 2021-03-05: 50 mg
  Filled 2021-03-05: qty 2

## 2021-03-05 MED ORDER — METOPROLOL TARTRATE 50 MG PO TABS
75.0000 mg | ORAL_TABLET | Freq: Two times a day (BID) | ORAL | Status: DC
  Administered 2021-03-05 – 2021-03-07 (×5): 75 mg
  Filled 2021-03-05 (×5): qty 1

## 2021-03-05 MED ORDER — SPIRONOLACTONE 25 MG PO TABS
50.0000 mg | ORAL_TABLET | Freq: Once | ORAL | Status: DC
  Filled 2021-03-05: qty 2

## 2021-03-05 MED ORDER — POTASSIUM CHLORIDE 20 MEQ PO PACK
40.0000 meq | PACK | Freq: Two times a day (BID) | ORAL | Status: DC
  Administered 2021-03-05: 40 meq
  Filled 2021-03-05: qty 2

## 2021-03-05 NOTE — Progress Notes (Signed)
HR high 130s, SBP staying around 180, 0.5 mg bolus of dilaudid given w/ no relief. Dr. Janee Morn notified and verbal order to restart Metoprolol 75 mg per tube BID.

## 2021-03-05 NOTE — Progress Notes (Signed)
eLink Physician-Brief Progress Note Patient Name: Scott [name] DOB: 05-28-1986 MRN: [id number]   Date of Service  03/05/2021  HPI/Events of Note  K 2.6. He has K ordered per tube already. Received at the same time as the draw and has only 1 more dose left. Would you like to order more K??  Does have am lab ordered already.  takes per tube or CVC  Discussed with bed side RN.  eICU Interventions  Got 40 meq 80 total today since AM, still low at 2.6, not getting better. Has CVL.Cr normal.  - Kcl IV replacement protocol ordered. Get stat Mag and Po4, if low to call back for replacement.      Intervention Category Intermediate Interventions: Electrolyte abnormality - evaluation and management  Ranee Gosselin 03/05/2021, 7:35 PM
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
## 2021-03-05 NOTE — Progress Notes (Signed)
RT obtained ABG and reported critical results to MD and RN @1643 . ABG as follows 7.44/66.5/62/45.1 K 2.6. No new orders at this time. RT will continue to monitor.

## 2021-03-05 NOTE — Progress Notes (Signed)
RT obtained ABG per MD and reported critical K of 2.5 to MD and RN as well as the following ABG @0847 : 7.54/49.6/60/42.6. RT decreased RR form 30 to 22 per MD. RT will continue to monitor.

## 2021-03-05 NOTE — Progress Notes (Signed)
Patient ID: Scott [name], male   DOB: Oct 02, 1985, 35 y.o.   MRN: [id number] Follow up - Trauma Critical Care  Patient Details:    ALTIN SEASE is an 35 y.o. male.  Lines/tubes : Airway 8 mm (Active)  Secured at (cm) 24 cm 03/05/21 0801  Measured From Lips 03/05/21 0801  Secured Location Center 03/05/21 0801  Secured By [employer] 03/05/21 0801  Tube Holder Repositioned Yes 03/05/21 0801  Prone position No 03/05/21 0338  Head position Left 03/03/21 2344  Cuff Pressure (cm H2O) Green OR 18-26 CmH2O 03/05/21 0801  Site Condition Dry 03/05/21 0801     PICC Triple Lumen 02/20/21 PICC Right Basilic 39 cm 0 cm (Active)  Indication for Insertion or Continuance of Line Poor Vasculature-patient has had multiple peripheral attempts or PIVs lasting less than 24 hours;Limited venous access - need for IV therapy >5 days (PICC only) 03/05/21 0730  Exposed Catheter (cm) 1 cm 02/27/21 1710  Site Assessment Clean;Dry;Intact 03/05/21 0730  Lumen #1 Status Infusing 03/05/21 0730  Lumen #2 Status Infusing 03/05/21 0730  Lumen #3 Status Infusing 03/05/21 0730  Dressing Type Transparent;Non-restrictive 03/05/21 0730  Dressing Status Clean;Dry;Intact 03/05/21 0730  Antimicrobial disc in place? Yes 03/05/21 0730  Safety Lock Not Applicable 03/05/21 0730  Line Care Connections checked and tightened 03/05/21 0730  Line Adjustment (NICU/IV Team Only) No 03/03/21 0800  Dressing Intervention Dressing changed;Antimicrobial disc changed 02/27/21 1710  Dressing Change Due 03/06/21 03/05/21 0730     Arterial Line 02/21/21 Left Radial (Active)  Site Assessment Clean;Dry;Intact 03/05/21 0730  Line Status Pulsatile blood flow 03/05/21 0730  Art Line Waveform Appropriate 03/05/21 0730  Art Line Interventions Zeroed and calibrated;Leveled;Connections checked and tightened;Flushed per protocol;Line pulled back 03/05/21 0730  Color/Movement/Sensation Capillary refill less than 3 sec 03/05/21 0730   Dressing Type Transparent 03/05/21 0730  Dressing Status Clean;Dry;Intact 03/05/21 0730  Interventions Dressing changed;Dressing reinforced;Antimicrobial disc changed;New dressing 03/03/21 0236  Dressing Change Due 03/08/21 03/05/21 0730     NG/OG Vented/Dual Lumen Orogastric 16 Fr. Oral (Active)  Tube Position (Required) External length of tube 03/04/21 2000  Measurement (cm) (Required) 55 cm 03/04/21 0800  Ongoing Placement Verification (Required) (See row information) Yes 03/04/21 2000  Site Assessment Clean;Dry;Intact 03/04/21 2000  Interventions Cleansed 03/04/21 2000  Status Clamped 03/04/21 2000  Drainage Appearance Tan 02/23/21 2000  Output (mL) 0 mL 02/25/21 2200     Flatus Tube/Pouch (Active)  Daily care Skin around tube assessed 03/04/21 2000  Output (mL) 50 mL 03/04/21 2200     Urethral Catheter [name], RN Non-latex 16 Fr. (Active)  Indication for Insertion or Continuance of Catheter Acute urinary retention (I&O Cath for 24 hrs prior to catheter insertion- Inpatient Only) 03/05/21 0800  Site Assessment Clean;Intact 03/05/21 0800  Catheter Maintenance Bag below level of bladder;Catheter secured;Drainage bag/tubing not touching floor;Insertion date on drainage bag;No dependent loops;Seal intact 03/05/21 0800  Collection Container Standard drainage bag 03/05/21 0800  Securement Method Securing device (Describe) 03/05/21 0800  Urinary Catheter Interventions (if applicable) Unclamped 03/05/21 0800  Output (mL) 100 mL 03/05/21 0655    Microbiology/Sepsis markers: Results for orders placed or performed during the hospital encounter of 02/15/21  Resp Panel by RT-PCR (Flu A&B, Covid) Nasopharyngeal Swab     Status: None   Collection Time: 02/15/21  8:14 PM   Specimen: Nasopharyngeal Swab; Nasopharyngeal(NP) swabs in vial transport medium  Result Value Ref Range Status   SARS Coronavirus 2 by RT PCR NEGATIVE NEGATIVE Final  Comment: (NOTE) SARS-CoV-2 target  nucleic acids are NOT DETECTED.  The SARS-CoV-2 RNA is generally detectable in upper respiratory specimens during the acute phase of infection. The lowest concentration of SARS-CoV-2 viral copies this assay can detect is 138 copies/mL. A negative result does not preclude SARS-Cov-2 infection and should not be used as the sole basis for treatment or other patient management decisions. A negative result may occur with  improper specimen collection/handling, submission of specimen other than nasopharyngeal swab, presence of viral mutation(s) within the areas targeted by this assay, and inadequate number of viral copies(<138 copies/mL). A negative result must be combined with clinical observations, patient history, and epidemiological information. The expected result is Negative.  Fact Sheet for Patients:  BloggerCourse.com  Fact Sheet for Healthcare Providers:  SeriousBroker.it  This test is no t yet approved or cleared by the [place] FDA and  has been authorized for detection and/or diagnosis of SARS-CoV-2 by FDA under an Emergency Use Authorization (EUA). This EUA will remain  in effect (meaning this test can be used) for the duration of the COVID-19 declaration under Section 564(b)(1) of the Act, 21 U.S.C.section 360bbb-3(b)(1), unless the authorization is terminated  or revoked sooner.       Influenza A by PCR NEGATIVE NEGATIVE Final   Influenza B by PCR NEGATIVE NEGATIVE Final    Comment: (NOTE) The Xpert Xpress SARS-CoV-2/FLU/RSV plus assay is intended as an aid in the diagnosis of influenza from Nasopharyngeal swab specimens and should not be used as a sole basis for treatment. Nasal washings and aspirates are unacceptable for Xpert Xpress SARS-CoV-2/FLU/RSV testing.  Fact Sheet for Patients: BloggerCourse.com  Fact Sheet for Healthcare  Providers: SeriousBroker.it  This test is not yet approved or cleared by the [place] FDA and has been authorized for detection and/or diagnosis of SARS-CoV-2 by FDA under an Emergency Use Authorization (EUA). This EUA will remain in effect (meaning this test can be used) for the duration of the COVID-19 declaration under Section 564(b)(1) of the Act, 21 U.S.C. section 360bbb-3(b)(1), unless the authorization is terminated or revoked.  Performed at [facility], [address], [place], [place] [address]   MRSA Next Gen by PCR, Nasal     Status: None   Collection Time: 02/15/21  9:58 PM   Specimen: Nasal Mucosa; Nasal Swab  Result Value Ref Range Status   MRSA by PCR Next Gen NOT DETECTED NOT DETECTED Final    Comment: (NOTE) The GeneXpert MRSA Assay (FDA approved for NASAL specimens only), is one component of a comprehensive MRSA colonization surveillance program. It is not intended to diagnose MRSA infection nor to guide or monitor treatment for MRSA infections. Test performance is not FDA approved in patients less than 45 years old. Performed at [facility], [address] [place], [place] [address]   Culture, Respiratory w Gram Stain     Status: None   Collection Time: 02/17/21  2:04 PM   Specimen: Tracheal Aspirate; Respiratory  Result Value Ref Range Status   Specimen Description TRACHEAL ASPIRATE  Final   Special Requests NONE  Final   Gram Stain   Final    ABUNDANT WBC PRESENT,BOTH PMN AND MONONUCLEAR ABUNDANT GRAM NEGATIVE RODS MODERATE GRAM POSITIVE COCCI RARE GRAM POSITIVE RODS    Culture   Final    FEW Normal respiratory flora-no Staph aureus or Pseudomonas seen Performed at [facility], [address] [place], [place] [address]    Report Status 02/19/2021  FINAL  Final  Culture, blood (routine x 2)     Status: None   Collection Time: 02/17/21  2:24 PM   Specimen: BLOOD  Result Value Ref Range Status    Specimen Description BLOOD BLOOD RIGHT FOREARM  Final   Special Requests   Final    BOTTLES DRAWN AEROBIC AND ANAEROBIC Blood Culture adequate volume   Culture   Final    NO GROWTH 5 DAYS Performed at [facility], [address] [place], [place] [address]    Report Status 02/22/2021 FINAL  Final  Culture, blood (routine x 2)     Status: None   Collection Time: 02/17/21  3:01 PM   Specimen: BLOOD  Result Value Ref Range Status   Specimen Description BLOOD LEFT ANTECUBITAL  Final   Special Requests   Final    BOTTLES DRAWN AEROBIC AND ANAEROBIC Blood Culture adequate volume   Culture   Final    NO GROWTH 5 DAYS Performed at [facility], [address], [place], [place] [address]    Report Status 02/22/2021 FINAL  Final  Culture, Respiratory w Gram Stain     Status: None   Collection Time: 02/20/21 12:49 PM   Specimen: Tracheal Aspirate; Respiratory  Result Value Ref Range Status   Specimen Description TRACHEAL ASPIRATE  Final   Special Requests NONE  Final   Gram Stain   Final    MODERATE WBC PRESENT,BOTH PMN AND MONONUCLEAR ABUNDANT GRAM POSITIVE COCCI IN PAIRS ABUNDANT GRAM NEGATIVE RODS Performed at [facility], [address], [place], [place] [address]    Culture   Final    ABUNDANT METHICILLIN RESISTANT STAPHYLOCOCCUS AUREUS ABUNDANT PSEUDOMONAS AERUGINOSA    Report Status 02/23/2021 FINAL  Final   Organism ID, Bacteria METHICILLIN RESISTANT STAPHYLOCOCCUS AUREUS  Final   Organism ID, Bacteria PSEUDOMONAS AERUGINOSA  Final      Susceptibility   Methicillin resistant staphylococcus aureus - MIC*    CIPROFLOXACIN <=0.5 SENSITIVE Sensitive     ERYTHROMYCIN <=0.25 SENSITIVE Sensitive     GENTAMICIN <=0.5 SENSITIVE Sensitive     OXACILLIN >=4 RESISTANT Resistant     TETRACYCLINE <=1 SENSITIVE Sensitive     VANCOMYCIN 1 SENSITIVE Sensitive     TRIMETH/SULFA <=10 SENSITIVE Sensitive     CLINDAMYCIN <=0.25 SENSITIVE Sensitive     RIFAMPIN <=0.5  SENSITIVE Sensitive     Inducible Clindamycin NEGATIVE Sensitive     * ABUNDANT METHICILLIN RESISTANT STAPHYLOCOCCUS AUREUS   Pseudomonas aeruginosa - MIC*    CEFTAZIDIME 4 SENSITIVE Sensitive     CIPROFLOXACIN 0.5 SENSITIVE Sensitive     GENTAMICIN <=1 SENSITIVE Sensitive     IMIPENEM 2 SENSITIVE Sensitive     PIP/TAZO 8 SENSITIVE Sensitive     CEFEPIME 2 SENSITIVE Sensitive     * ABUNDANT PSEUDOMONAS AERUGINOSA  Monkeypox Virus DNA, Qualitative Real-Time PCR     Status: None   Collection Time: 02/23/21  1:42 AM   Specimen: Arm, Right; Sterile Swab  Result Value Ref Range Status   Orthopoxvirus DNA, QL PCR NOT DETECTED NOT DETECTED Final    Comment: (NOTE) Non-variola Orthopoxvirus DNA not detected by real-time PCR. Performed At: [facility] [address] [place], [place] [id number] Jolene Schimke MD HE:[id number]   Culture, Respiratory w Gram Stain     Status: None   Collection Time: 02/23/21 10:05 AM   Specimen: Tracheal Aspirate; Respiratory  Result Value Ref Range Status   Specimen Description TRACHEAL ASPIRATE  Final   Special Requests NONE  Final   Gram Stain   Final    ABUNDANT WBC PRESENT, PREDOMINANTLY PMN FEW GRAM NEGATIVE RODS RARE GRAM POSITIVE COCCI Performed at [facility], [address] [place], [place] [address]    Culture   Final    MODERATE PSEUDOMONAS AERUGINOSA RARE METHICILLIN RESISTANT STAPHYLOCOCCUS AUREUS    Report Status 02/26/2021 FINAL  Final   Organism ID, Bacteria PSEUDOMONAS AERUGINOSA  Final   Organism ID, Bacteria METHICILLIN RESISTANT STAPHYLOCOCCUS AUREUS  Final      Susceptibility   Methicillin resistant staphylococcus aureus - MIC*    CIPROFLOXACIN <=0.5 SENSITIVE Sensitive     ERYTHROMYCIN <=0.25 SENSITIVE Sensitive     GENTAMICIN <=0.5 SENSITIVE Sensitive     OXACILLIN >=4 RESISTANT Resistant     TETRACYCLINE <=1 SENSITIVE Sensitive     VANCOMYCIN <=0.5 SENSITIVE Sensitive     TRIMETH/SULFA <=10 SENSITIVE  Sensitive     CLINDAMYCIN <=0.25 SENSITIVE Sensitive     RIFAMPIN <=0.5 SENSITIVE Sensitive     Inducible Clindamycin NEGATIVE Sensitive     * RARE METHICILLIN RESISTANT STAPHYLOCOCCUS AUREUS   Pseudomonas aeruginosa - MIC*    CEFTAZIDIME 4 SENSITIVE Sensitive     CIPROFLOXACIN <=0.25 SENSITIVE Sensitive     GENTAMICIN <=1 SENSITIVE Sensitive     IMIPENEM 1 SENSITIVE Sensitive     PIP/TAZO 8 SENSITIVE Sensitive     CEFEPIME 2 SENSITIVE Sensitive     * MODERATE PSEUDOMONAS AERUGINOSA  Culture, blood (Routine X 2) w Reflex to ID Panel     Status: None   Collection Time: 02/24/21 10:53 AM   Specimen: BLOOD RIGHT HAND  Result Value Ref Range Status   Specimen Description BLOOD RIGHT HAND  Final   Special Requests   Final    BOTTLES DRAWN AEROBIC AND ANAEROBIC Blood Culture adequate volume   Culture   Final    NO GROWTH 5 DAYS Performed at [facility], [address], [place], [place] [address]    Report Status 03/01/2021 FINAL  Final  Culture, blood (Routine X 2) w Reflex to ID Panel     Status: None   Collection Time: 02/24/21 10:58 AM   Specimen: BLOOD LEFT HAND  Result Value Ref Range Status   Specimen Description BLOOD LEFT HAND  Final   Special Requests   Final    BOTTLES DRAWN AEROBIC AND ANAEROBIC Blood Culture adequate volume   Culture   Final    NO GROWTH 5 DAYS Performed at [facility], [address] [place], [place] [address]    Report Status 03/01/2021 FINAL  Final  SARS CORONAVIRUS 2 (TAT 6-24 HRS) Nasopharyngeal Nasopharyngeal Swab     Status: None   Collection Time: 02/25/21  9:20 AM   Specimen: Nasopharyngeal Swab  Result Value Ref Range Status   SARS Coronavirus 2 NEGATIVE NEGATIVE Final    Comment: (NOTE) SARS-CoV-2 target nucleic acids are NOT DETECTED.  The SARS-CoV-2 RNA is generally detectable in upper and lower respiratory specimens during the acute phase of infection. Negative results do not preclude SARS-CoV-2 infection, do not rule  out co-infections with other pathogens, and should not be used as the sole basis for treatment or other patient management decisions. Negative results must be combined with clinical observations, patient history, and epidemiological information. The expected result is Negative.  Fact Sheet for Patients: HairSlick.no  Fact Sheet for Healthcare Providers: quierodirigir.com  This test is not yet approved or cleared  by the [place] FDA and  has been authorized for detection and/or diagnosis of SARS-CoV-2 by FDA under an Emergency Use Authorization (EUA). This EUA will remain  in effect (meaning this test can be used) for the duration of the COVID-19 declaration under Se ction 564(b)(1) of the Act, 21 U.S.C. section 360bbb-3(b)(1), unless the authorization is terminated or revoked sooner.  Performed at [facility], [address] [place], [place] [address]   Culture, Respiratory w Gram Stain     Status: None (Preliminary result)   Collection Time: 03/04/21 10:37 AM   Specimen: Tracheal Aspirate; Respiratory  Result Value Ref Range Status   Specimen Description TRACHEAL ASPIRATE  Final   Special Requests NONE  Final   Gram Stain   Final    FEW SQUAMOUS EPITHELIAL CELLS PRESENT MODERATE WBC PRESENT, PREDOMINANTLY PMN MODERATE GRAM NEGATIVE RODS Performed at [facility], [address] [place], [place] [address]    Culture PENDING  Incomplete   Report Status PENDING  Incomplete    Anti-infectives:  Anti-infectives (From admission, onward)    Start     Dose/Rate Route Frequency Ordered Stop   02/24/21 1200  vancomycin (VANCOCIN) IVPB 1000 mg/200 mL premix        1,000 mg 200 mL/hr over 60 Minutes Intravenous Every 8 hours 02/24/21 0934 03/02/21 2020   02/23/21 0200  vancomycin (VANCOREADY) IVPB 1500 mg/300 mL  Status:  Discontinued        1,500 mg 150 mL/hr over 120 Minutes Intravenous Every 8 hours  02/22/21 1931 02/24/21 0934   02/22/21 2100  vancomycin (VANCOCIN) 250 mg in sodium chloride 0.9 % 100 mL IVPB        250 mg 100 mL/hr over 60 Minutes Intravenous  Once 02/22/21 2012 02/22/21 2150   02/22/21 2030  vancomycin (VANCOCIN) 250 mg in sodium chloride 0.9 % 500 mL IVPB  Status:  Discontinued        250 mg 250 mL/hr over 120 Minutes Intravenous  Once 02/22/21 1931 02/22/21 2012   02/21/21 1800  vancomycin (VANCOREADY) IVPB 1250 mg/250 mL  Status:  Discontinued        1,250 mg 166.7 mL/hr over 90 Minutes Intravenous Every 8 hours 02/21/21 0851 02/22/21 1931   02/21/21 1000  ceFEPIme (MAXIPIME) 2 g in sodium chloride 0.9 % 100 mL IVPB        2 g 200 mL/hr over 30 Minutes Intravenous Every 8 hours 02/21/21 0851 03/02/21 1749   02/21/21 1000  vancomycin (VANCOREADY) IVPB 2000 mg/400 mL        2,000 mg 200 mL/hr over 120 Minutes Intravenous  Once 02/21/21 0851 02/21/21 1306       Best Practice/Protocols:  VTE Prophylaxis: Lovenox (full dose) Continous Sedation  Consults: Treatment Team:  Serena Colonelosen, Jefry, MD Lisbeth RenshawNundkumar, Neelesh, MD Myrene GalasHandy, Michael, MD    Studies:    Events:  Subjective:    Overnight Issues:   Objective:  Vital signs for last 24 hours: Temp:  [98.5 F (36.9 C)-100.4 F (38 C)] 99 F (37.2 C) (08/27 0800) Pulse Rate:  [105-135] 122 (08/27 0915) Resp:  [22-31] 22 (08/27 0915) BP: (95-134)/(4-81) 134/80 (08/27 0915) SpO2:  [90 %-97 %] 92 % (08/27 0915) Arterial Line BP: (91-132)/(49-77) 119/61 (08/27 0815) FiO2 (%):  [50 %-70 %] 50 % (08/27 0801)  Hemodynamic parameters for last 24 hours:    Intake/Output from previous day: 08/26 0701 - 08/27 0700 In: 3166.9 [P.O.:1; I.V.:2007.7; NG/GT:1158.2] Out: 3575 [Urine:3525;  Stool:50]  Intake/Output this shift: Total I/O In: 573.9 [I.V.:432.1; NG/GT:141.8] Out: -   Vent settings for last 24 hours: Vent Mode: PRVC FiO2 (%):  [50 %-70 %] 50 % Set Rate:  [22 bmp-30 bmp] 22 bmp Vt Set:  [460 mL-620  mL] 620 mL PEEP:  [10 cmH20-14 cmH20] 10 cmH20 Plateau Pressure:  [23 cmH20-26 cmH20] 24 cmH20  Physical Exam:  General: on vnet Neuro: sedated HEENT/Neck: ETT Resp: coarse CVS: RRR GI: soft, NT Extremities: no edema  Results for orders placed or performed during the hospital encounter of 02/15/21 (from the past 24 hour(s))  Culture, Respiratory w Gram Stain     Status: None (Preliminary result)   Collection Time: 03/04/21 10:37 AM   Specimen: Tracheal Aspirate; Respiratory  Result Value Ref Range   Specimen Description TRACHEAL ASPIRATE    Special Requests NONE    Gram Stain      FEW SQUAMOUS EPITHELIAL CELLS PRESENT MODERATE WBC PRESENT, PREDOMINANTLY PMN MODERATE GRAM NEGATIVE RODS Performed at [facility], [address], [place], [place] [address]    Culture PENDING    Report Status PENDING   Glucose, capillary     Status: Abnormal   Collection Time: 03/04/21 11:30 AM  Result Value Ref Range   Glucose-Capillary 141 (H) 70 - 99 mg/dL  Glucose, capillary     Status: Abnormal   Collection Time: 03/04/21  3:48 PM  Result Value Ref Range   Glucose-Capillary 147 (H) 70 - 99 mg/dL  I-STAT 7, (LYTES, BLD GAS, ICA, H+H)     Status: Abnormal   Collection Time: 03/04/21  4:06 PM  Result Value Ref Range   pH, Arterial 7.388 7.350 - 7.450   pCO2 arterial 93.5 (HH) 32.0 - 48.0 mmHg   pO2, Arterial 123 (H) 83.0 - 108.0 mmHg   Bicarbonate 56.2 (H) 20.0 - 28.0 mmol/L   TCO2 >50 (H) 22 - 32 mmol/L   O2 Saturation 98.0 %   Acid-Base Excess 26.0 (H) 0.0 - 2.0 mmol/L   Sodium 140 135 - 145 mmol/L   Potassium 3.6 3.5 - 5.1 mmol/L   Calcium, Ion 1.13 (L) 1.15 - 1.40 mmol/L   HCT 32.0 (L) 39.0 - 52.0 %   Hemoglobin 10.9 (L) 13.0 - 17.0 g/dL   Patient temperature 60.4 F    Collection site art line    Drawn by RT    Sample type ARTERIAL    Comment NOTIFIED PHYSICIAN   Glucose, capillary     Status: Abnormal   Collection Time: 03/04/21  7:50 PM  Result Value Ref Range    Glucose-Capillary 131 (H) 70 - 99 mg/dL  Glucose, capillary     Status: Abnormal   Collection Time: 03/04/21 11:20 PM  Result Value Ref Range   Glucose-Capillary 153 (H) 70 - 99 mg/dL  Glucose, capillary     Status: Abnormal   Collection Time: 03/05/21  4:04 AM  Result Value Ref Range   Glucose-Capillary 119 (H) 70 - 99 mg/dL  CBC     Status: Abnormal   Collection Time: 03/05/21  5:23 AM  Result Value Ref Range   WBC 20.6 (H) 4.0 - 10.5 K/uL   RBC 3.01 (L) 4.22 - 5.81 MIL/uL   Hemoglobin 9.3 (L) 13.0 - 17.0 g/dL   HCT 54.0 (L) 98.1 - 19.1 %   MCV 98.7 80.0 - 100.0 fL   MCH 30.9 26.0 - 34.0 pg   MCHC 31.3 30.0 - 36.0 g/dL   RDW 47.8 (  H) 11.5 - 15.5 %   Platelets 681 (H) 150 - 400 K/uL   nRBC 0.0 0.0 - 0.2 %  Glucose, capillary     Status: Abnormal   Collection Time: 03/05/21  7:44 AM  Result Value Ref Range   Glucose-Capillary 117 (H) 70 - 99 mg/dL  I-STAT 7, (LYTES, BLD GAS, ICA, H+H)     Status: Abnormal   Collection Time: 03/05/21  8:47 AM  Result Value Ref Range   pH, Arterial 7.543 (H) 7.350 - 7.450   pCO2 arterial 49.6 (H) 32.0 - 48.0 mmHg   pO2, Arterial 60 (L) 83.0 - 108.0 mmHg   Bicarbonate 42.6 (H) 20.0 - 28.0 mmol/L   TCO2 44 (H) 22 - 32 mmol/L   O2 Saturation 93.0 %   Acid-Base Excess 18.0 (H) 0.0 - 2.0 mmol/L   Sodium 141 135 - 145 mmol/L   Potassium 2.5 (LL) 3.5 - 5.1 mmol/L   Calcium, Ion 1.17 1.15 - 1.40 mmol/L   HCT 31.0 (L) 39.0 - 52.0 %   Hemoglobin 10.5 (L) 13.0 - 17.0 g/dL   Patient temperature 16.1 F    Collection site Radial    Drawn by VP    Sample type ARTERIAL    Comment NOTIFIED PHYSICIAN     Assessment & Plan: Present on Admission:  Traumatic brain injury (HCC)    LOS: 18 days   Additional comments:I reviewed the patient's new clinical lab test results. Marland Kitchen [facility]   SAH, 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 Conchita Paris, keppra x7d for sz ppx, repeat 4V Angio CT of neck 8/21 negative. 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 Assess off paralytic. G1 L ICA injury - ASA 325 Facial laceration involving left eyelid - ENT c/s, Dr. Pollyann Kennedy, repaired 8/9 Skull fx extending ito L TMJ, L sphenoid sinus, R mastoid; R nasal bone fx - ENT c/s, Dr. Pollyann Kennedy, repeat exam when more neurologically appropriate R calf DVT - LMWH Severe ARDS - appreciate CCM assist - 50% and PEEP 10, PaCO2 down ID/PNA - Vanc for MRSA PNA, completed cefepime for pseud PNA. Secretions improved AF, WBC 19.9 so will repeat resp CX. Monkey pox negative (lesions R forearm).  CV/Hypertensive urgency - off cleviprex gtt, metoprolol to 75 BID - hold for low BP. Now on neo after diuresis L clavicle and L scapula fx - now non-op per Dr. [name] rash, scattered abrasions - local wound care Alcohol abuse - CIWA, TOC c/s H/o tobacco use disorder  FEN - cortrak, goal TF, dulcolax supp, metolazone today, replete hypokalemia DVT - SCDs, R calf DVT - LMWH with anti-Xa levels to ensure tx-ic Foley - in as has been on paralytic Dispo - ICU, improving Critical Care Total Time*: 33 Minutes  Violeta Gelinas, MD, MPH, FACS Trauma & General Surgery Use [email or web address] to contact on call provider  03/05/2021  *Care during the described time interval was provided by me. I have reviewed this patient's available data, including medical history, events of note, physical examination and test results as part of my evaluation.

## 2021-03-05 NOTE — Progress Notes (Signed)
K 2.5, Dr. Janee Morn made aware.  No new orders taken.

## 2021-03-06 LAB — CBC WITH DIFFERENTIAL/PLATELET
Abs Immature Granulocytes: 0.46 10*3/uL — ABNORMAL HIGH (ref 0.00–0.07)
Basophils Absolute: 0.1 10*3/uL (ref 0.0–0.1)
Basophils Relative: 0 %
Eosinophils Absolute: 0.3 10*3/uL (ref 0.0–0.5)
Eosinophils Relative: 1 %
HCT: 30.1 % — ABNORMAL LOW (ref 39.0–52.0)
Hemoglobin: 9.2 g/dL — ABNORMAL LOW (ref 13.0–17.0)
Immature Granulocytes: 2 %
Lymphocytes Relative: 7 %
Lymphs Abs: 1.6 10*3/uL (ref 0.7–4.0)
MCH: 30.9 pg (ref 26.0–34.0)
MCHC: 30.6 g/dL (ref 30.0–36.0)
MCV: 101 fL — ABNORMAL HIGH (ref 80.0–100.0)
Monocytes Absolute: 2.5 10*3/uL — ABNORMAL HIGH (ref 0.1–1.0)
Monocytes Relative: 11 %
Neutro Abs: 17.6 10*3/uL — ABNORMAL HIGH (ref 1.7–7.7)
Neutrophils Relative %: 79 %
Platelets: 667 10*3/uL — ABNORMAL HIGH (ref 150–400)
RBC: 2.98 MIL/uL — ABNORMAL LOW (ref 4.22–5.81)
RDW: 16.1 % — ABNORMAL HIGH (ref 11.5–15.5)
WBC: 22.6 10*3/uL — ABNORMAL HIGH (ref 4.0–10.5)
nRBC: 0 % (ref 0.0–0.2)

## 2021-03-06 LAB — COMPREHENSIVE METABOLIC PANEL
ALT: 83 U/L — ABNORMAL HIGH (ref 0–44)
AST: 63 U/L — ABNORMAL HIGH (ref 15–41)
Albumin: 2.2 g/dL — ABNORMAL LOW (ref 3.5–5.0)
Alkaline Phosphatase: 241 U/L — ABNORMAL HIGH (ref 38–126)
Anion gap: 8 (ref 5–15)
BUN: 44 mg/dL — ABNORMAL HIGH (ref 6–20)
CO2: 36 mmol/L — ABNORMAL HIGH (ref 22–32)
Calcium: 9.5 mg/dL (ref 8.9–10.3)
Chloride: 98 mmol/L (ref 98–111)
Creatinine, Ser: 0.64 mg/dL (ref 0.61–1.24)
GFR, Estimated: >60 mL/min (ref >60)
Glucose, Bld: 123 mg/dL — ABNORMAL HIGH (ref 70–99)
Potassium: 3.8 mmol/L (ref 3.5–5.1)
Sodium: 142 mmol/L (ref 135–145)
Total Bilirubin: 0.9 mg/dL (ref 0.3–1.2)
Total Protein: 7.9 g/dL (ref 6.5–8.1)

## 2021-03-06 LAB — POCT I-STAT 7, (LYTES, BLD GAS, ICA,H+H)
Acid-Base Excess: 13 mmol/L — ABNORMAL HIGH (ref 0.0–2.0)
Bicarbonate: 39.3 mmol/L — ABNORMAL HIGH (ref 20.0–28.0)
Calcium, Ion: 1.25 mmol/L (ref 1.15–1.40)
HCT: 31 % — ABNORMAL LOW (ref 39.0–52.0)
Hemoglobin: 10.5 g/dL — ABNORMAL LOW (ref 13.0–17.0)
O2 Saturation: 99 %
Patient temperature: 99.8
Potassium: 3.8 mmol/L (ref 3.5–5.1)
Sodium: 143 mmol/L (ref 135–145)
TCO2: 41 mmol/L — ABNORMAL HIGH (ref 22–32)
pCO2 arterial: 63 mmHg — ABNORMAL HIGH (ref 32.0–48.0)
pH, Arterial: 7.406 (ref 7.350–7.450)
pO2, Arterial: 157 mmHg — ABNORMAL HIGH (ref 83.0–108.0)

## 2021-03-06 LAB — GLUCOSE, CAPILLARY
Glucose-Capillary: 127 mg/dL — ABNORMAL HIGH (ref 70–99)
Glucose-Capillary: 127 mg/dL — ABNORMAL HIGH (ref 70–99)
Glucose-Capillary: 136 mg/dL — ABNORMAL HIGH (ref 70–99)
Glucose-Capillary: 122 mg/dL — ABNORMAL HIGH (ref 70–99)
Glucose-Capillary: 146 mg/dL — ABNORMAL HIGH (ref 70–99)
Glucose-Capillary: 142 mg/dL — ABNORMAL HIGH (ref 70–99)

## 2021-03-06 LAB — TRIGLYCERIDES: Triglycerides: 195 mg/dL — ABNORMAL HIGH (ref <150)

## 2021-03-06 LAB — MAGNESIUM: Magnesium: 2.6 mg/dL — ABNORMAL HIGH (ref 1.7–2.4)

## 2021-03-06 MED ORDER — ACETAZOLAMIDE 250 MG PO TABS
500.0000 mg | ORAL_TABLET | Freq: Two times a day (BID) | ORAL | Status: AC
  Administered 2021-03-06 – 2021-03-07 (×4): 500 mg
  Filled 2021-03-06 (×5): qty 2

## 2021-03-06 MED ORDER — PIPERACILLIN-TAZOBACTAM 3.375 G IVPB
3.3750 g | Freq: Three times a day (TID) | INTRAVENOUS | Status: AC
  Administered 2021-03-06 – 2021-03-10 (×12): 3.375 g via INTRAVENOUS
  Filled 2021-03-06 (×13): qty 50

## 2021-03-06 NOTE — Progress Notes (Signed)
Patient ID: Scott [name], male   DOB: [birth year], 35 y.o.   MRN: [id number] Follow up - Trauma Critical Care  Patient Details:    Scott [name] is an 35 y.o. male.  Lines/tubes : Airway 8 mm (Active)  Secured at (cm) 24 cm 03/06/21 0731  Measured From Lips 03/06/21 0731  Secured Location Right 03/06/21 0731  Secured By [employer] 03/06/21 0731  Tube Holder Repositioned Yes 03/06/21 0731  Prone position No 03/06/21 0419  Head position Left 03/03/21 2344  Cuff Pressure (cm H2O) Green OR 18-26 [facility] 03/06/21 0731  Site Condition Dry 03/06/21 0731     PICC Triple Lumen 02/20/21 PICC Right Basilic 39 cm 0 cm (Active)  Indication for Insertion or Continuance of Line Poor Vasculature-patient has had multiple peripheral attempts or PIVs lasting less than 24 hours 03/06/21 0800  Exposed Catheter (cm) 1 cm 02/27/21 1710  Site Assessment Clean;Dry;Intact 03/06/21 0800  Lumen #1 Status Infusing 03/06/21 0800  Lumen #2 Status In-line blood sampling system in place;Infusing 03/06/21 0800  Lumen #3 Status Infusing 03/06/21 0800  Dressing Type Transparent 03/06/21 0800  Dressing Status Clean;Dry;Intact 03/06/21 0800  Antimicrobial disc in place? Yes 03/06/21 0800  Safety Lock Not Applicable 03/05/21 0730  Line Care Connections checked and tightened;Line pulled back 03/05/21 2000  Line Adjustment (NICU/IV Team Only) No 03/03/21 0800  Dressing Intervention Dressing changed;Antimicrobial disc changed 02/27/21 1710  Dressing Change Due 03/06/21 03/06/21 0800     Arterial Line 02/21/21 Left Radial (Active)  Site Assessment Clean;Dry;Intact 03/06/21 0800  Line Status Pulsatile blood flow 03/06/21 0800  Art Line Waveform Appropriate 03/06/21 0800  Art Line Interventions Leveled 03/06/21 0800  Color/Movement/Sensation Capillary refill less than 3 sec 03/06/21 0800  Dressing Type Transparent 03/06/21 0800  Dressing Status Clean;Dry;Intact 03/06/21 0800  Interventions Dressing  changed;Dressing reinforced;Antimicrobial disc changed;New dressing 03/03/21 0236  Dressing Change Due 03/09/21 03/06/21 0800     NG/OG Vented/Dual Lumen Orogastric 16 Fr. Oral (Active)  Tube Position (Required) External length of tube 03/05/21 2000  Measurement (cm) (Required) 55 cm 03/05/21 2000  Ongoing Placement Verification (Required) (See row information) Yes 03/05/21 2000  Site Assessment Intact 03/06/21 0800  Interventions Clamped 03/06/21 0800  Status Clamped 03/06/21 0800  Drainage Appearance Green;Bile 03/05/21 2000  Output (mL) 0 mL 02/25/21 2200     Flatus Tube/Pouch (Active)  Daily care Skin around tube assessed 03/05/21 2000  Output (mL) 100 mL 03/06/21 0500     Urethral Catheter [name], RN Non-latex 16 Fr. (Active)  Indication for Insertion or Continuance of Catheter Acute urinary retention (I&O Cath for 24 hrs prior to catheter insertion- Inpatient Only) 03/06/21 0719  Site Assessment Intact;Clean 03/06/21 0719  Catheter Maintenance Catheter secured;Drainage bag/tubing not touching floor;Bag below level of bladder;Insertion date on drainage bag;No dependent loops;Seal intact 03/06/21 0719  Collection Container Standard drainage bag 03/06/21 0719  Securement Method Securing device (Describe) 03/06/21 0719  Urinary Catheter Interventions (if applicable) Unclamped 03/06/21 0719  Output (mL) 900 mL 03/06/21 0500    Microbiology/Sepsis markers: Results for orders placed or performed during the hospital encounter of 02/15/21  Resp Panel by RT-PCR (Flu A&B, Covid) Nasopharyngeal Swab     Status: None   Collection Time: 02/15/21  8:14 PM   Specimen: Nasopharyngeal Swab; Nasopharyngeal(NP) swabs in vial transport medium  Result Value Ref Range Status   SARS Coronavirus 2 by RT PCR NEGATIVE NEGATIVE Final    Comment: (NOTE) SARS-CoV-2 target nucleic acids are NOT DETECTED.  The SARS-CoV-2 RNA is generally detectable in upper respiratory specimens during the  acute phase of infection. The lowest concentration of SARS-CoV-2 viral copies this assay can detect is 138 copies/mL. A negative result does not preclude SARS-Cov-2 infection and should not be used as the sole basis for treatment or other patient management decisions. A negative result may occur with  improper specimen collection/handling, submission of specimen other than nasopharyngeal swab, presence of viral mutation(s) within the areas targeted by this assay, and inadequate number of viral copies(<138 copies/mL). A negative result must be combined with clinical observations, patient history, and epidemiological information. The expected result is Negative.  Fact Sheet for Patients:  BloggerCourse.com  Fact Sheet for Healthcare Providers:  SeriousBroker.it  This test is no t yet approved or cleared by the [place] FDA and  has been authorized for detection and/or diagnosis of SARS-CoV-2 by FDA under an Emergency Use Authorization (EUA). This EUA will remain  in effect (meaning this test can be used) for the duration of the COVID-19 declaration under Section 564(b)(1) of the Act, 21 U.S.C.section 360bbb-3(b)(1), unless the authorization is terminated  or revoked sooner.       Influenza A by PCR NEGATIVE NEGATIVE Final   Influenza B by PCR NEGATIVE NEGATIVE Final    Comment: (NOTE) The Xpert Xpress SARS-CoV-2/FLU/RSV plus assay is intended as an aid in the diagnosis of influenza from Nasopharyngeal swab specimens and should not be used as a sole basis for treatment. Nasal washings and aspirates are unacceptable for Xpert Xpress SARS-CoV-2/FLU/RSV testing.  Fact Sheet for Patients: BloggerCourse.com  Fact Sheet for Healthcare Providers: SeriousBroker.it  This test is not yet approved or cleared by the [place] FDA and has been authorized for detection and/or  diagnosis of SARS-CoV-2 by FDA under an Emergency Use Authorization (EUA). This EUA will remain in effect (meaning this test can be used) for the duration of the COVID-19 declaration under Section 564(b)(1) of the Act, 21 U.S.C. section 360bbb-3(b)(1), unless the authorization is terminated or revoked.  Performed at [facility], [address] [place], [place] [address]   MRSA Next Gen by PCR, Nasal     Status: None   Collection Time: 02/15/21  9:58 PM   Specimen: Nasal Mucosa; Nasal Swab  Result Value Ref Range Status   MRSA by PCR Next Gen NOT DETECTED NOT DETECTED Final    Comment: (NOTE) The GeneXpert MRSA Assay (FDA approved for NASAL specimens only), is one component of a comprehensive MRSA colonization surveillance program. It is not intended to diagnose MRSA infection nor to guide or monitor treatment for MRSA infections. Test performance is not FDA approved in patients less than 9 years old. Performed at [facility], [address], [place], [place] [address]   Culture, Respiratory w Gram Stain     Status: None   Collection Time: 02/17/21  2:04 PM   Specimen: Tracheal Aspirate; Respiratory  Result Value Ref Range Status   Specimen Description TRACHEAL ASPIRATE  Final   Special Requests NONE  Final   Gram Stain   Final    ABUNDANT WBC PRESENT,BOTH PMN AND MONONUCLEAR ABUNDANT GRAM NEGATIVE RODS MODERATE GRAM POSITIVE COCCI RARE GRAM POSITIVE RODS    Culture   Final    FEW Normal respiratory flora-no Staph aureus or Pseudomonas seen Performed at [facility], [address] [place], [place] [address]    Report Status 02/19/2021 FINAL  Final  Culture, blood (routine x 2)  Status: None   Collection Time: 02/17/21  2:24 PM   Specimen: BLOOD  Result Value Ref Range Status   Specimen Description BLOOD BLOOD RIGHT FOREARM  Final   Special Requests   Final    BOTTLES DRAWN AEROBIC AND ANAEROBIC Blood Culture adequate volume   Culture   Final     NO GROWTH 5 DAYS Performed at [facility], [address] [place], [place] [address]    Report Status 02/22/2021 FINAL  Final  Culture, blood (routine x 2)     Status: None   Collection Time: 02/17/21  3:01 PM   Specimen: BLOOD  Result Value Ref Range Status   Specimen Description BLOOD LEFT ANTECUBITAL  Final   Special Requests   Final    BOTTLES DRAWN AEROBIC AND ANAEROBIC Blood Culture adequate volume   Culture   Final    NO GROWTH 5 DAYS Performed at [facility], [address], [place], [place] [address]    Report Status 02/22/2021 FINAL  Final  Culture, Respiratory w Gram Stain     Status: None   Collection Time: 02/20/21 12:49 PM   Specimen: Tracheal Aspirate; Respiratory  Result Value Ref Range Status   Specimen Description TRACHEAL ASPIRATE  Final   Special Requests NONE  Final   Gram Stain   Final    MODERATE WBC PRESENT,BOTH PMN AND MONONUCLEAR ABUNDANT GRAM POSITIVE COCCI IN PAIRS ABUNDANT GRAM NEGATIVE RODS Performed at [facility], [address], [place], [place] [address]    Culture   Final    ABUNDANT METHICILLIN RESISTANT STAPHYLOCOCCUS AUREUS ABUNDANT PSEUDOMONAS AERUGINOSA    Report Status 02/23/2021 FINAL  Final   Organism ID, Bacteria METHICILLIN RESISTANT STAPHYLOCOCCUS AUREUS  Final   Organism ID, Bacteria PSEUDOMONAS AERUGINOSA  Final      Susceptibility   Methicillin resistant staphylococcus aureus - MIC*    CIPROFLOXACIN <=0.5 SENSITIVE Sensitive     ERYTHROMYCIN <=0.25 SENSITIVE Sensitive     GENTAMICIN <=0.5 SENSITIVE Sensitive     OXACILLIN >=4 RESISTANT Resistant     TETRACYCLINE <=1 SENSITIVE Sensitive     VANCOMYCIN 1 SENSITIVE Sensitive     TRIMETH/SULFA <=10 SENSITIVE Sensitive     CLINDAMYCIN <=0.25 SENSITIVE Sensitive     RIFAMPIN <=0.5 SENSITIVE Sensitive     Inducible Clindamycin NEGATIVE Sensitive     * ABUNDANT METHICILLIN RESISTANT STAPHYLOCOCCUS AUREUS   Pseudomonas aeruginosa - MIC*    CEFTAZIDIME 4  SENSITIVE Sensitive     CIPROFLOXACIN 0.5 SENSITIVE Sensitive     GENTAMICIN <=1 SENSITIVE Sensitive     IMIPENEM 2 SENSITIVE Sensitive     PIP/TAZO 8 SENSITIVE Sensitive     CEFEPIME 2 SENSITIVE Sensitive     * ABUNDANT PSEUDOMONAS AERUGINOSA  Monkeypox Virus DNA, Qualitative Real-Time PCR     Status: None   Collection Time: 02/23/21  1:42 AM   Specimen: Arm, Right; Sterile Swab  Result Value Ref Range Status   Orthopoxvirus DNA, QL PCR NOT DETECTED NOT DETECTED Final    Comment: (NOTE) Non-variola Orthopoxvirus DNA not detected by real-time PCR. Performed At: [facility] [address] [place], [place] [id number] Jolene Schimke MD OZ:[id number]   Culture, Respiratory w Gram Stain     Status: None   Collection Time: 02/23/21 10:05 AM   Specimen: Tracheal Aspirate; Respiratory  Result Value Ref Range Status   Specimen Description TRACHEAL ASPIRATE  Final   Special Requests NONE  Final   Gram Stain  Final    ABUNDANT WBC PRESENT, PREDOMINANTLY PMN FEW GRAM NEGATIVE RODS RARE GRAM POSITIVE COCCI Performed at [facility], [address], [place], [place] [address]    Culture   Final    MODERATE PSEUDOMONAS AERUGINOSA RARE METHICILLIN RESISTANT STAPHYLOCOCCUS AUREUS    Report Status 02/26/2021 FINAL  Final   Organism ID, Bacteria PSEUDOMONAS AERUGINOSA  Final   Organism ID, Bacteria METHICILLIN RESISTANT STAPHYLOCOCCUS AUREUS  Final      Susceptibility   Methicillin resistant staphylococcus aureus - MIC*    CIPROFLOXACIN <=0.5 SENSITIVE Sensitive     ERYTHROMYCIN <=0.25 SENSITIVE Sensitive     GENTAMICIN <=0.5 SENSITIVE Sensitive     OXACILLIN >=4 RESISTANT Resistant     TETRACYCLINE <=1 SENSITIVE Sensitive     VANCOMYCIN <=0.5 SENSITIVE Sensitive     TRIMETH/SULFA <=10 SENSITIVE Sensitive     CLINDAMYCIN <=0.25 SENSITIVE Sensitive     RIFAMPIN <=0.5 SENSITIVE Sensitive     Inducible Clindamycin NEGATIVE Sensitive     * RARE METHICILLIN RESISTANT  STAPHYLOCOCCUS AUREUS   Pseudomonas aeruginosa - MIC*    CEFTAZIDIME 4 SENSITIVE Sensitive     CIPROFLOXACIN <=0.25 SENSITIVE Sensitive     GENTAMICIN <=1 SENSITIVE Sensitive     IMIPENEM 1 SENSITIVE Sensitive     PIP/TAZO 8 SENSITIVE Sensitive     CEFEPIME 2 SENSITIVE Sensitive     * MODERATE PSEUDOMONAS AERUGINOSA  Culture, blood (Routine X 2) w Reflex to ID Panel     Status: None   Collection Time: 02/24/21 10:53 AM   Specimen: BLOOD RIGHT HAND  Result Value Ref Range Status   Specimen Description BLOOD RIGHT HAND  Final   Special Requests   Final    BOTTLES DRAWN AEROBIC AND ANAEROBIC Blood Culture adequate volume   Culture   Final    NO GROWTH 5 DAYS Performed at [facility], [address] [place], [place] [address]    Report Status 03/01/2021 FINAL  Final  Culture, blood (Routine X 2) w Reflex to ID Panel     Status: None   Collection Time: 02/24/21 10:58 AM   Specimen: BLOOD LEFT HAND  Result Value Ref Range Status   Specimen Description BLOOD LEFT HAND  Final   Special Requests   Final    BOTTLES DRAWN AEROBIC AND ANAEROBIC Blood Culture adequate volume   Culture   Final    NO GROWTH 5 DAYS Performed at [facility], [address], [place], [place] [address]    Report Status 03/01/2021 FINAL  Final  SARS CORONAVIRUS 2 (TAT 6-24 HRS) Nasopharyngeal Nasopharyngeal Swab     Status: None   Collection Time: 02/25/21  9:20 AM   Specimen: Nasopharyngeal Swab  Result Value Ref Range Status   SARS Coronavirus 2 NEGATIVE NEGATIVE Final    Comment: (NOTE) SARS-CoV-2 target nucleic acids are NOT DETECTED.  The SARS-CoV-2 RNA is generally detectable in upper and lower respiratory specimens during the acute phase of infection. Negative results do not preclude SARS-CoV-2 infection, do not rule out co-infections with other pathogens, and should not be used as the sole basis for treatment or other patient management decisions. Negative results must be combined  with clinical observations, patient history, and epidemiological information. The expected result is Negative.  Fact Sheet for Patients: HairSlick.no  Fact Sheet for Healthcare Providers: quierodirigir.com  This test is not yet approved or cleared by the [place] FDA and  has been authorized for detection and/or diagnosis  of SARS-CoV-2 by FDA under an Emergency Use Authorization (EUA). This EUA will remain  in effect (meaning this test can be used) for the duration of the COVID-19 declaration under Se ction 564(b)(1) of the Act, 21 U.S.C. section 360bbb-3(b)(1), unless the authorization is terminated or revoked sooner.  Performed at [facility], [address] [place], [place] [address]   Culture, Respiratory w Gram Stain     Status: None (Preliminary result)   Collection Time: 03/04/21 10:37 AM   Specimen: Tracheal Aspirate; Respiratory  Result Value Ref Range Status   Specimen Description TRACHEAL ASPIRATE  Final   Special Requests NONE  Final   Gram Stain   Final    FEW SQUAMOUS EPITHELIAL CELLS PRESENT MODERATE WBC PRESENT, PREDOMINANTLY PMN MODERATE GRAM NEGATIVE RODS    Culture   Final    CULTURE REINCUBATED FOR BETTER GROWTH Performed at [facility], [address] [place], [place] [address]    Report Status PENDING  Incomplete    Anti-infectives:  Anti-infectives (From admission, onward)    Start     Dose/Rate Route Frequency Ordered Stop   02/24/21 1200  vancomycin (VANCOCIN) IVPB 1000 mg/200 mL premix        1,000 mg 200 mL/hr over 60 Minutes Intravenous Every 8 hours 02/24/21 0934 03/02/21 2020   02/23/21 0200  vancomycin (VANCOREADY) IVPB 1500 mg/300 mL  Status:  Discontinued        1,500 mg 150 mL/hr over 120 Minutes Intravenous Every 8 hours 02/22/21 1931 02/24/21 0934   02/22/21 2100  vancomycin (VANCOCIN) 250 mg in sodium chloride 0.9 % 100 mL IVPB        250 mg 100 mL/hr over  60 Minutes Intravenous  Once 02/22/21 2012 02/22/21 2150   02/22/21 2030  vancomycin (VANCOCIN) 250 mg in sodium chloride 0.9 % 500 mL IVPB  Status:  Discontinued        250 mg 250 mL/hr over 120 Minutes Intravenous  Once 02/22/21 1931 02/22/21 2012   02/21/21 1800  vancomycin (VANCOREADY) IVPB 1250 mg/250 mL  Status:  Discontinued        1,250 mg 166.7 mL/hr over 90 Minutes Intravenous Every 8 hours 02/21/21 0851 02/22/21 1931   02/21/21 1000  ceFEPIme (MAXIPIME) 2 g in sodium chloride 0.9 % 100 mL IVPB        2 g 200 mL/hr over 30 Minutes Intravenous Every 8 hours 02/21/21 0851 03/02/21 1749   02/21/21 1000  vancomycin (VANCOREADY) IVPB 2000 mg/400 mL        2,000 mg 200 mL/hr over 120 Minutes Intravenous  Once 02/21/21 0851 02/21/21 1306       Best Practice/Protocols:  VTE Prophylaxis: Lovenox (full dose) Continous Sedation  Consults: Treatment Team:  Serena Colonelosen, Jefry, MD Lisbeth RenshawNundkumar, Neelesh, MD Myrene GalasHandy, Michael, MD    Studies:    Events:  Subjective:    Overnight Issues:   Objective:  Vital signs for last 24 hours: Temp:  [98.9 F (37.2 C)-102.7 F (39.3 C)] 100.3 F (37.9 C) (08/28 0800) Pulse Rate:  [106-137] 111 (08/28 0800) Resp:  [20-25] 22 (08/28 0800) BP: (106-139)/(58-81) 121/69 (08/28 0800) SpO2:  [87 %-100 %] 93 % (08/28 0800) Arterial Line BP: (86-141)/(47-68) 103/50 (08/28 0800) FiO2 (%):  [50 %-80 %] 60 % (08/28 0731) Weight:  [72.5 kg] 72.5 kg (08/28 0400)  Hemodynamic parameters for last 24 hours:    Intake/Output from previous day: 08/27 0701 - 08/28 0700 In: 2876.3 [I.V.:1343.4; NG/GT:1141.8; IV  Piggyback:391.1] Out: 4005 [Urine:3785; Emesis/NG output:120; Stool:100]  Intake/Output this shift: Total I/O In: 139.8 [I.V.:39.8; NG/GT:100] Out: 275 [Urine:275]  Vent settings for last 24 hours: Vent Mode: PRVC FiO2 (%):  [50 %-80 %] 60 % Set Rate:  [22 bmp] 22 bmp Vt Set:  [409 mL] 620 mL PEEP:  [8 cmH20-10 cmH20] 8 cmH20 Plateau  Pressure:  [20 cmH20-24 cmH20] 20 cmH20  Physical Exam:  General: on vent Neuro: unresponsive HEENT/Neck: ETT Resp: some rhonchi CVS: RRR GI: soft, NT Extremities: no edema  Results for orders placed or performed during the hospital encounter of 02/15/21 (from the past 24 hour(s))  Glucose, capillary     Status: Abnormal   Collection Time: 03/05/21 11:56 AM  Result Value Ref Range   Glucose-Capillary 140 (H) 70 - 99 mg/dL  Glucose, capillary     Status: Abnormal   Collection Time: 03/05/21  3:45 PM  Result Value Ref Range   Glucose-Capillary 127 (H) 70 - 99 mg/dL  Basic metabolic panel     Status: Abnormal   Collection Time: 03/05/21  6:10 PM  Result Value Ref Range   Sodium 142 135 - 145 mmol/L   Potassium 2.6 (LL) 3.5 - 5.1 mmol/L   Chloride 94 (L) 98 - 111 mmol/L   CO2 37 (H) 22 - 32 mmol/L   Glucose, Bld 133 (H) 70 - 99 mg/dL   BUN 48 (H) 6 - 20 mg/dL   Creatinine, Ser 8.11 0.61 - 1.24 mg/dL   Calcium 8.9 8.9 - 91.4 mg/dL   GFR, Estimated >78 >29 mL/min   Anion gap 11 5 - 15  Magnesium     Status: Abnormal   Collection Time: 03/05/21  8:01 PM  Result Value Ref Range   Magnesium 2.7 (H) 1.7 - 2.4 mg/dL  Phosphorus     Status: None   Collection Time: 03/05/21  8:01 PM  Result Value Ref Range   Phosphorus 3.8 2.5 - 4.6 mg/dL  Glucose, capillary     Status: Abnormal   Collection Time: 03/05/21  8:16 PM  Result Value Ref Range   Glucose-Capillary 115 (H) 70 - 99 mg/dL  Glucose, capillary     Status: Abnormal   Collection Time: 03/05/21 11:37 PM  Result Value Ref Range   Glucose-Capillary 111 (H) 70 - 99 mg/dL  Glucose, capillary     Status: Abnormal   Collection Time: 03/06/21  3:12 AM  Result Value Ref Range   Glucose-Capillary 127 (H) 70 - 99 mg/dL  Triglycerides     Status: Abnormal   Collection Time: 03/06/21  4:19 AM  Result Value Ref Range   Triglycerides 195 (H) <150 mg/dL  Comprehensive metabolic panel     Status: Abnormal   Collection Time: 03/06/21   4:19 AM  Result Value Ref Range   Sodium 142 135 - 145 mmol/L   Potassium 3.8 3.5 - 5.1 mmol/L   Chloride 98 98 - 111 mmol/L   CO2 36 (H) 22 - 32 mmol/L   Glucose, Bld 123 (H) 70 - 99 mg/dL   BUN 44 (H) 6 - 20 mg/dL   Creatinine, Ser 5.62 0.61 - 1.24 mg/dL   Calcium 9.5 8.9 - 13.0 mg/dL   Total Protein 7.9 6.5 - 8.1 g/dL   Albumin 2.2 (L) 3.5 - 5.0 g/dL   AST 63 (H) 15 - 41 U/L   ALT 83 (H) 0 - 44 U/L   Alkaline Phosphatase 241 (H) 38 - 126 U/L   Total Bilirubin  0.9 0.3 - 1.2 mg/dL   GFR, Estimated >52 >84 mL/min   Anion gap 8 5 - 15  CBC with Differential/Platelet     Status: Abnormal   Collection Time: 03/06/21  4:19 AM  Result Value Ref Range   WBC 22.6 (H) 4.0 - 10.5 K/uL   RBC 2.98 (L) 4.22 - 5.81 MIL/uL   Hemoglobin 9.2 (L) 13.0 - 17.0 g/dL   HCT 13.2 (L) 44.0 - 10.2 %   MCV 101.0 (H) 80.0 - 100.0 fL   MCH 30.9 26.0 - 34.0 pg   MCHC 30.6 30.0 - 36.0 g/dL   RDW 72.5 (H) 36.6 - 44.0 %   Platelets 667 (H) 150 - 400 K/uL   nRBC 0.0 0.0 - 0.2 %   Neutrophils Relative % 79 %   Neutro Abs 17.6 (H) 1.7 - 7.7 K/uL   Lymphocytes Relative 7 %   Lymphs Abs 1.6 0.7 - 4.0 K/uL   Monocytes Relative 11 %   Monocytes Absolute 2.5 (H) 0.1 - 1.0 K/uL   Eosinophils Relative 1 %   Eosinophils Absolute 0.3 0.0 - 0.5 K/uL   Basophils Relative 0 %   Basophils Absolute 0.1 0.0 - 0.1 K/uL   Immature Granulocytes 2 %   Abs Immature Granulocytes 0.46 (H) 0.00 - 0.07 K/uL  Magnesium     Status: Abnormal   Collection Time: 03/06/21  4:19 AM  Result Value Ref Range   Magnesium 2.6 (H) 1.7 - 2.4 mg/dL  I-STAT 7, (LYTES, BLD GAS, ICA, H+H)     Status: Abnormal   Collection Time: 03/06/21  4:28 AM  Result Value Ref Range   pH, Arterial 7.406 7.350 - 7.450   pCO2 arterial 63.0 (H) 32.0 - 48.0 mmHg   pO2, Arterial 157 (H) 83.0 - 108.0 mmHg   Bicarbonate 39.3 (H) 20.0 - 28.0 mmol/L   TCO2 41 (H) 22 - 32 mmol/L   O2 Saturation 99.0 %   Acid-Base Excess 13.0 (H) 0.0 - 2.0 mmol/L   Sodium 143  135 - 145 mmol/L   Potassium 3.8 3.5 - 5.1 mmol/L   Calcium, Ion 1.25 1.15 - 1.40 mmol/L   HCT 31.0 (L) 39.0 - 52.0 %   Hemoglobin 10.5 (L) 13.0 - 17.0 g/dL   Patient temperature 34.7 F    Sample type ARTERIAL   Glucose, capillary     Status: Abnormal   Collection Time: 03/06/21  7:28 AM  Result Value Ref Range   Glucose-Capillary 127 (H) 70 - 99 mg/dL    Assessment & Plan: Present on Admission:  Traumatic brain injury (HCC)    LOS: 19 days   Additional comments:I reviewed the patient's new clinical lab test results. [facility]   SAH, 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 Conchita Paris, keppra x7d for sz ppx, repeat 4V Angio CT of neck 8/21 negative. 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 Continue to assess with lowering sedation G1 L ICA injury - ASA 325 Facial laceration involving left eyelid - ENT c/s, Dr. Pollyann Kennedy, repaired 8/9 Skull fx extending ito L TMJ, L sphenoid sinus, R mastoid; R nasal bone fx - ENT c/s, Dr. Pollyann Kennedy, repeat exam when more neurologically appropriate R calf DVT - LMWH Severe ARDS - appreciate CCM assist - 60% and PEEP 8, PaCO2 down to 60s ID/PNA - Vanc for MRSA PNA, completed cefepime for pseud PNA. Secretions improved AF, WBC 22 and repeat resp CX is pending,  Monkey pox negative (lesions R forearm).  CV/Hypertensive urgency - off cleviprex gtt, metoprolol 75 BID  L clavicle and L scapula fx - now non-op per Dr. [name] rash, scattered abrasions - local wound care Alcohol abuse - CIWA, TOC c/s H/o tobacco use disorder  FEN - cortrak, goal TF, dulcolax supp, metolazone today, replete hypokalemia DVT - SCDs, R calf DVT - LMWH with anti-Xa levels to ensure tx-ic Foley - possible voiding trial tomorrow Dispo - ICU, improving Critical Care Total Time*: 32 Minutes  Violeta Gelinas, MD, MPH, FACS Trauma & General Surgery Use [email or web address] to contact on call provider  03/06/2021  *Care during the described time interval  was provided by me. I have reviewed this patient's available data, including medical history, events of note, physical examination and test results as part of my evaluation.

## 2021-03-06 NOTE — Progress Notes (Signed)
Patient was turned to his left side from the right side and RN's noticed purulent drainage coming from the right ear onto the pillow case. Pt's ear was cleaned out and a moderate amount of tan/pink drainage was on the washcloth. CCM and Trauma both notified. Trauma MD gave verbal order to attempt to collect a specimen and send it for culture. Orders placed. Jovon Winterhalter, [place] Scrape, RN
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
## 2021-03-06 NOTE — Progress Notes (Signed)
eLink Physician-Brief Progress Note Patient Name: Scott [name] DOB: [birth year] MRN: [id number]   Date of Service  03/06/2021  HPI/Events of Note  ABG reviewed.  eICU Interventions  Vent epic order updated, fio2 down to 60 and PEEP to 8.      Intervention Category Minor Interventions: Other:  Ranee Gosselin 03/06/2021, 5:21 AM

## 2021-03-06 NOTE — Progress Notes (Signed)
Pharmacy Antibiotic Note  Scott [name] is a 35 y.o. male admitted on 02/15/2021 with possible pseudomonas pneumonia s/p 10 day course of cefepime. Intubated since 8/9. WBC up trending and CXR with worsening infiltrate on the right. Suspected early mild infiltrate on the left. Pharmacy has been consulted for piperacillin/tazobactam dosing.  Tmax 8/27 was 101.2, 100.5 8/28. WBC 22.6 up trending.    Plan: Zosyn 3.375g IV q8h (4 hour infusion). Monitor cultures, clinical status, renal fx Narrow abx as able and f/u duration    Height: 5\' 11"  (180.3 cm) Weight: 72.5 kg (159 lb 13.3 oz) IBW/kg (Calculated) : 75.3  Temp (24hrs), Avg:99.9 F (37.7 C), Min:98.9 F (37.2 C), Max:101.2 F (38.4 C)  Recent Labs  Lab 03/02/21 0430 03/03/21 0519 03/04/21 0404 03/05/21 0523 03/05/21 0835 03/05/21 1810 03/06/21 0419  WBC 17.2* 15.4* 19.9* 20.6*  --   --  22.6*  CREATININE 0.52* 0.45* 0.58*  --  0.67 0.70 0.64    Estimated Creatinine Clearance: 132.2 mL/min (by C-G formula based on SCr of 0.64 mg/dL).    Allergies  Allergen Reactions   Lactose Intolerance (Gi)     Antimicrobials this admission: Vanco 8/15>> 8/24 Cefepime 8/15>> 8/24 Piptazo 8/28>>     Microbiology results: 8/17 trach: few pseudomonas, rare MRSA  8/18 BCx: ngtd  8/26 Sputum: few pseudomonas (S- ceftaz)    Thank you for allowing pharmacy to be a part of this patient's care.  9/26, PharmD, BCPS, BCCP Clinical Pharmacist  Please check AMION for all [facility] Pharmacy phone numbers After 10:00 PM, call Main Pharmacy [phone]

## 2021-03-07 ENCOUNTER — Inpatient Hospital Stay (HOSPITAL_COMMUNITY): Payer: 59

## 2021-03-07 LAB — GLUCOSE, CAPILLARY
Glucose-Capillary: 131 mg/dL — ABNORMAL HIGH (ref 70–99)
Glucose-Capillary: 138 mg/dL — ABNORMAL HIGH (ref 70–99)
Glucose-Capillary: 139 mg/dL — ABNORMAL HIGH (ref 70–99)
Glucose-Capillary: 142 mg/dL — ABNORMAL HIGH (ref 70–99)
Glucose-Capillary: 144 mg/dL — ABNORMAL HIGH (ref 70–99)
Glucose-Capillary: 144 mg/dL — ABNORMAL HIGH (ref 70–99)

## 2021-03-07 LAB — POCT I-STAT 7, (LYTES, BLD GAS, ICA,H+H)
Acid-Base Excess: 24 mmol/L — ABNORMAL HIGH (ref 0.0–2.0)
Acid-Base Excess: 2 mmol/L (ref 0.0–2.0)
Bicarbonate: 49.8 mmol/L — ABNORMAL HIGH (ref 20.0–28.0)
Bicarbonate: 28.2 mmol/L — ABNORMAL HIGH (ref 20.0–28.0)
Calcium, Ion: 1.11 mmol/L — ABNORMAL LOW (ref 1.15–1.40)
Calcium, Ion: 1.32 mmol/L (ref 1.15–1.40)
HCT: 28 % — ABNORMAL LOW (ref 39.0–52.0)
HCT: 33 % — ABNORMAL LOW (ref 39.0–52.0)
Hemoglobin: 9.5 g/dL — ABNORMAL LOW (ref 13.0–17.0)
Hemoglobin: 11.2 g/dL — ABNORMAL LOW (ref 13.0–17.0)
O2 Saturation: 96 %
O2 Saturation: 97 %
Patient temperature: 101.6
Potassium: 2.8 mmol/L — ABNORMAL LOW (ref 3.5–5.1)
Potassium: 3.6 mmol/L (ref 3.5–5.1)
Sodium: 142 mmol/L (ref 135–145)
Sodium: 144 mmol/L (ref 135–145)
TCO2: >50 mmol/L — ABNORMAL HIGH (ref 22–32)
TCO2: 30 mmol/L (ref 22–32)
pCO2 arterial: 59.6 mmHg — ABNORMAL HIGH (ref 32.0–48.0)
pCO2 arterial: 51.5 mmHg — ABNORMAL HIGH (ref 32.0–48.0)
pH, Arterial: 7.53 — ABNORMAL HIGH (ref 7.350–7.450)
pH, Arterial: 7.354 (ref 7.350–7.450)
pO2, Arterial: 78 mmHg — ABNORMAL LOW (ref 83.0–108.0)
pO2, Arterial: 101 mmHg (ref 83.0–108.0)

## 2021-03-07 LAB — COMPREHENSIVE METABOLIC PANEL
ALT: 183 U/L — ABNORMAL HIGH (ref 0–44)
AST: 176 U/L — ABNORMAL HIGH (ref 15–41)
Albumin: 2.1 g/dL — ABNORMAL LOW (ref 3.5–5.0)
Alkaline Phosphatase: 284 U/L — ABNORMAL HIGH (ref 38–126)
Anion gap: 9 (ref 5–15)
BUN: 36 mg/dL — ABNORMAL HIGH (ref 6–20)
CO2: 30 mmol/L (ref 22–32)
Calcium: 9.7 mg/dL (ref 8.9–10.3)
Chloride: 103 mmol/L (ref 98–111)
Creatinine, Ser: 0.67 mg/dL (ref 0.61–1.24)
GFR, Estimated: >60 mL/min (ref >60)
Glucose, Bld: 135 mg/dL — ABNORMAL HIGH (ref 70–99)
Potassium: 2.8 mmol/L — ABNORMAL LOW (ref 3.5–5.1)
Sodium: 142 mmol/L (ref 135–145)
Total Bilirubin: 1 mg/dL (ref 0.3–1.2)
Total Protein: 8.3 g/dL — ABNORMAL HIGH (ref 6.5–8.1)

## 2021-03-07 LAB — CBC WITH DIFFERENTIAL/PLATELET
Abs Immature Granulocytes: 0.49 10*3/uL — ABNORMAL HIGH (ref 0.00–0.07)
Basophils Absolute: 0.1 10*3/uL (ref 0.0–0.1)
Basophils Relative: 1 %
Eosinophils Absolute: 0.3 10*3/uL (ref 0.0–0.5)
Eosinophils Relative: 2 %
HCT: 30.5 % — ABNORMAL LOW (ref 39.0–52.0)
Hemoglobin: 9.2 g/dL — ABNORMAL LOW (ref 13.0–17.0)
Immature Granulocytes: 3 %
Lymphocytes Relative: 13 %
Lymphs Abs: 2 10*3/uL (ref 0.7–4.0)
MCH: 30.7 pg (ref 26.0–34.0)
MCHC: 30.2 g/dL (ref 30.0–36.0)
MCV: 101.7 fL — ABNORMAL HIGH (ref 80.0–100.0)
Monocytes Absolute: 2 10*3/uL — ABNORMAL HIGH (ref 0.1–1.0)
Monocytes Relative: 13 %
Neutro Abs: 11.3 10*3/uL — ABNORMAL HIGH (ref 1.7–7.7)
Neutrophils Relative %: 68 %
Platelets: 550 10*3/uL — ABNORMAL HIGH (ref 150–400)
RBC: 3 MIL/uL — ABNORMAL LOW (ref 4.22–5.81)
RDW: 16.2 % — ABNORMAL HIGH (ref 11.5–15.5)
WBC: 16.3 10*3/uL — ABNORMAL HIGH (ref 4.0–10.5)
nRBC: 0 % (ref 0.0–0.2)

## 2021-03-07 LAB — MAGNESIUM: Magnesium: 2.2 mg/dL (ref 1.7–2.4)

## 2021-03-07 LAB — CULTURE, RESPIRATORY W GRAM STAIN

## 2021-03-07 MED ORDER — POTASSIUM CHLORIDE 20 MEQ PO PACK
40.0000 meq | PACK | Freq: Once | ORAL | Status: AC
  Administered 2021-03-07: 40 meq
  Filled 2021-03-07: qty 2

## 2021-03-07 MED ORDER — POTASSIUM CHLORIDE 10 MEQ/50ML IV SOLN
10.0000 meq | INTRAVENOUS | Status: AC
  Administered 2021-03-07 (×4): 10 meq via INTRAVENOUS
  Filled 2021-03-07 (×4): qty 50

## 2021-03-07 MED ORDER — OXYCODONE HCL 5 MG PO TABS
10.0000 mg | ORAL_TABLET | Freq: Four times a day (QID) | ORAL | Status: DC
Start: 2021-03-07 — End: 2021-03-16
  Administered 2021-03-07 – 2021-03-16 (×34): 10 mg
  Filled 2021-03-07 (×36): qty 2

## 2021-03-07 MED ORDER — METOPROLOL TARTRATE 50 MG PO TABS
100.0000 mg | ORAL_TABLET | Freq: Two times a day (BID) | ORAL | Status: DC
  Administered 2021-03-07 – 2021-03-09 (×4): 100 mg
  Filled 2021-03-07 (×4): qty 2

## 2021-03-07 MED ORDER — METOPROLOL TARTRATE 25 MG PO TABS
25.0000 mg | ORAL_TABLET | Freq: Once | ORAL | Status: AC
  Administered 2021-03-07: 25 mg
  Filled 2021-03-07: qty 1

## 2021-03-07 MED ORDER — ENOXAPARIN SODIUM 80 MG/0.8ML IJ SOSY
70.0000 mg | PREFILLED_SYRINGE | Freq: Two times a day (BID) | INTRAMUSCULAR | Status: DC
  Administered 2021-03-07 – 2021-03-16 (×19): 70 mg via SUBCUTANEOUS
  Filled 2021-03-07 (×19): qty 0.8

## 2021-03-07 NOTE — Progress Notes (Signed)
Notified Dr Freida Busman of patients HR-130, RR32, 02-90%.  Gave patient 2 boluses of dilaudid and  patient still breathing over the vent and tachycardic.  Verbal order to increase fio2 to 50% and STAt chest xray.  Will continue to monitor
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
## 2021-03-08 DIAGNOSIS — L8990 Pressure ulcer of unspecified site, unspecified stage: Secondary | ICD-10-CM | POA: Insufficient documentation

## 2021-03-08 LAB — POCT I-STAT 7, (LYTES, BLD GAS, ICA,H+H)
Acid-Base Excess: 1 mmol/L (ref 0.0–2.0)
Bicarbonate: 26.9 mmol/L (ref 20.0–28.0)
Calcium, Ion: 1.33 mmol/L (ref 1.15–1.40)
HCT: 38 % — ABNORMAL LOW (ref 39.0–52.0)
Hemoglobin: 12.9 g/dL — ABNORMAL LOW (ref 13.0–17.0)
O2 Saturation: 98 %
Patient temperature: 98
Potassium: 3 mmol/L — ABNORMAL LOW (ref 3.5–5.1)
Sodium: 147 mmol/L — ABNORMAL HIGH (ref 135–145)
TCO2: 28 mmol/L (ref 22–32)
pCO2 arterial: 46.6 mmHg (ref 32.0–48.0)
pH, Arterial: 7.367 (ref 7.350–7.450)
pO2, Arterial: 100 mmHg (ref 83.0–108.0)

## 2021-03-08 LAB — CBC WITH DIFFERENTIAL/PLATELET
Abs Immature Granulocytes: 0.6 10*3/uL — ABNORMAL HIGH (ref 0.00–0.07)
Basophils Absolute: 0.1 10*3/uL (ref 0.0–0.1)
Basophils Relative: 1 %
Eosinophils Absolute: 0.2 10*3/uL (ref 0.0–0.5)
Eosinophils Relative: 1 %
HCT: 33.5 % — ABNORMAL LOW (ref 39.0–52.0)
Hemoglobin: 10.7 g/dL — ABNORMAL LOW (ref 13.0–17.0)
Immature Granulocytes: 3 %
Lymphocytes Relative: 11 %
Lymphs Abs: 2 10*3/uL (ref 0.7–4.0)
MCH: 31.4 pg (ref 26.0–34.0)
MCHC: 31.9 g/dL (ref 30.0–36.0)
MCV: 98.2 fL (ref 80.0–100.0)
Monocytes Absolute: 2.2 10*3/uL — ABNORMAL HIGH (ref 0.1–1.0)
Monocytes Relative: 12 %
Neutro Abs: 12.7 10*3/uL — ABNORMAL HIGH (ref 1.7–7.7)
Neutrophils Relative %: 72 %
Platelets: 611 10*3/uL — ABNORMAL HIGH (ref 150–400)
RBC: 3.41 MIL/uL — ABNORMAL LOW (ref 4.22–5.81)
RDW: 16.1 % — ABNORMAL HIGH (ref 11.5–15.5)
WBC: 17.8 10*3/uL — ABNORMAL HIGH (ref 4.0–10.5)
nRBC: 0 % (ref 0.0–0.2)

## 2021-03-08 LAB — COMPREHENSIVE METABOLIC PANEL
ALT: 233 U/L — ABNORMAL HIGH (ref 0–44)
AST: 154 U/L — ABNORMAL HIGH (ref 15–41)
Albumin: 2.2 g/dL — ABNORMAL LOW (ref 3.5–5.0)
Alkaline Phosphatase: 301 U/L — ABNORMAL HIGH (ref 38–126)
Anion gap: 11 (ref 5–15)
BUN: 34 mg/dL — ABNORMAL HIGH (ref 6–20)
CO2: 25 mmol/L (ref 22–32)
Calcium: 9.9 mg/dL (ref 8.9–10.3)
Chloride: 107 mmol/L (ref 98–111)
Creatinine, Ser: 0.56 mg/dL — ABNORMAL LOW (ref 0.61–1.24)
GFR, Estimated: >60 mL/min (ref >60)
Glucose, Bld: 141 mg/dL — ABNORMAL HIGH (ref 70–99)
Potassium: 3.2 mmol/L — ABNORMAL LOW (ref 3.5–5.1)
Sodium: 143 mmol/L (ref 135–145)
Total Bilirubin: 1 mg/dL (ref 0.3–1.2)
Total Protein: 9.2 g/dL — ABNORMAL HIGH (ref 6.5–8.1)

## 2021-03-08 LAB — GLUCOSE, CAPILLARY
Glucose-Capillary: 133 mg/dL — ABNORMAL HIGH (ref 70–99)
Glucose-Capillary: 127 mg/dL — ABNORMAL HIGH (ref 70–99)
Glucose-Capillary: 134 mg/dL — ABNORMAL HIGH (ref 70–99)
Glucose-Capillary: 135 mg/dL — ABNORMAL HIGH (ref 70–99)
Glucose-Capillary: 145 mg/dL — ABNORMAL HIGH (ref 70–99)
Glucose-Capillary: 126 mg/dL — ABNORMAL HIGH (ref 70–99)

## 2021-03-08 LAB — MAGNESIUM: Magnesium: 2.1 mg/dL (ref 1.7–2.4)

## 2021-03-08 MED ORDER — POTASSIUM CHLORIDE 10 MEQ/50ML IV SOLN
10.0000 meq | INTRAVENOUS | Status: AC
Start: 2021-03-08 — End: 2021-03-08
  Administered 2021-03-08 (×4): 10 meq via INTRAVENOUS
  Filled 2021-03-08 (×4): qty 50

## 2021-03-08 MED ORDER — POTASSIUM CHLORIDE 20 MEQ PO PACK
20.0000 meq | PACK | ORAL | Status: AC
  Administered 2021-03-08 (×2): 20 meq
  Filled 2021-03-08 (×2): qty 1

## 2021-03-08 MED ORDER — METOPROLOL TARTRATE 5 MG/5ML IV SOLN
5.0000 mg | Freq: Once | INTRAVENOUS | Status: AC
  Administered 2021-03-08: 5 mg via INTRAVENOUS
  Filled 2021-03-08: qty 5

## 2021-03-08 MED ORDER — METOPROLOL TARTRATE 5 MG/5ML IV SOLN
5.0000 mg | Freq: Four times a day (QID) | INTRAVENOUS | Status: DC | PRN
  Administered 2021-03-08 – 2021-03-10 (×3): 5 mg via INTRAVENOUS
  Filled 2021-03-08 (×4): qty 5

## 2021-03-08 MED ORDER — ESMOLOL HCL-SODIUM CHLORIDE 2000 MG/100ML IV SOLN
25.0000 ug/kg/min | INTRAVENOUS | Status: DC
  Administered 2021-03-08: 25 ug/kg/min via INTRAVENOUS
  Administered 2021-03-08: 250 ug/kg/min via INTRAVENOUS
  Administered 2021-03-08: 150 ug/kg/min via INTRAVENOUS
  Administered 2021-03-08: 275 ug/kg/min via INTRAVENOUS
  Administered 2021-03-08: 225 ug/kg/min via INTRAVENOUS
  Administered 2021-03-09: 250 ug/kg/min via INTRAVENOUS
  Administered 2021-03-09 (×2): 200 ug/kg/min via INTRAVENOUS
  Administered 2021-03-09 (×2): 125 ug/kg/min via INTRAVENOUS
  Administered 2021-03-09: 250 ug/kg/min via INTRAVENOUS
  Administered 2021-03-09: 125 ug/kg/min via INTRAVENOUS
  Administered 2021-03-10 (×2): 150 ug/kg/min via INTRAVENOUS
  Administered 2021-03-10: 75 ug/kg/min via INTRAVENOUS
  Administered 2021-03-10 (×4): 150 ug/kg/min via INTRAVENOUS
  Administered 2021-03-11 (×3): 125 ug/kg/min via INTRAVENOUS
  Administered 2021-03-11: 75 ug/kg/min via INTRAVENOUS
  Administered 2021-03-11: 100 ug/kg/min via INTRAVENOUS
  Administered 2021-03-11: 125 ug/kg/min via INTRAVENOUS
  Administered 2021-03-12: 175 ug/kg/min via INTRAVENOUS
  Administered 2021-03-12 (×3): 125 ug/kg/min via INTRAVENOUS
  Administered 2021-03-12: 175 ug/kg/min via INTRAVENOUS
  Administered 2021-03-12: 125 ug/kg/min via INTRAVENOUS
  Administered 2021-03-12 (×2): 175 ug/kg/min via INTRAVENOUS
  Administered 2021-03-13: 25 ug/kg/min via INTRAVENOUS
  Administered 2021-03-13: 50 ug/kg/min via INTRAVENOUS
  Administered 2021-03-13: 150 ug/kg/min via INTRAVENOUS
  Filled 2021-03-08 (×24): qty 100
  Filled 2021-03-08: qty 200
  Filled 2021-03-08 (×14): qty 100

## 2021-03-08 NOTE — Progress Notes (Signed)
   03/08/21 1100  Vitals  BP (!) 142/107  MAP (mmHg) 117  Pulse Rate (!) 139  ECG Heart Rate (!) 139  Resp (!) 29  Oxygen Therapy  SpO2 93 %  Art Line  Arterial Line BP 154/86  Arterial Line MAP (mmHg) 103 mmHg  MEWS Score  MEWS Temp 0  MEWS Systolic 0  MEWS Pulse 3  MEWS RR 2  MEWS LOC 2  MEWS Score 7  MEWS Score Color Red  Provider Notification  Provider Name/Title B. Janee Morn MD  Date Provider Notified 03/08/21  Time Provider Notified 1100  Notification Type Page  Notification Reason Other (Comment) (HR remains 130s after meds)  Provider response See new orders (esmolol gtt)  Date of Provider Response 03/08/21  Time of Provider Response 1100

## 2021-03-08 NOTE — Progress Notes (Signed)
Patient ID: Scott [name], male   DOB: Oct 16, 1985, 35 y.o.   MRN: [id number] Follow up - Trauma Critical Care  Patient Details:    Scott [name] is an 35 y.o. male.  Lines/tubes : Airway 8 mm (Active)  Secured at (cm) 25 cm 03/08/21 0757  Measured From Lips 03/08/21 0757  Secured Location Right 03/08/21 0757  Secured By [employer] 03/08/21 0757  Tube Holder Repositioned Yes 03/08/21 0757  Prone position No 03/08/21 0757  Head position Left 03/03/21 2344  Cuff Pressure (cm H2O) Clear OR 27-39 CmH2O 03/08/21 0757  Site Condition Dry 03/08/21 0757     PICC Triple Lumen 02/20/21 PICC Right Basilic 39 cm 0 cm (Active)  Indication for Insertion or Continuance of Line Poor Vasculature-patient has had multiple peripheral attempts or PIVs lasting less than 24 hours 03/07/21 1935  Exposed Catheter (cm) 1 cm 03/07/21 1200  Site Assessment Clean;Dry;Intact 03/07/21 1935  Lumen #1 Status Infusing 03/07/21 1935  Lumen #2 Status In-line blood sampling system in place 03/07/21 1935  Lumen #3 Status Infusing 03/07/21 1935  Dressing Type Transparent 03/07/21 1935  Dressing Status Clean;Dry;Intact 03/07/21 1935  Antimicrobial disc in place? Yes 03/07/21 1935  Safety Lock Intact 03/07/21 1935  Line Care Connections checked and tightened 03/07/21 1935  Line Adjustment (NICU/IV Team Only) No 03/03/21 0800  Dressing Intervention Dressing changed;Antimicrobial disc changed;Securement device changed 03/06/21 1600  Dressing Change Due 03/13/21 03/07/21 1935     Arterial Line 02/21/21 Left Radial (Active)  Site Assessment Clean;Dry;Intact 03/07/21 1935  Line Status Pulsatile blood flow 03/07/21 1935  Art Line Waveform Appropriate 03/07/21 1935  Art Line Interventions Connections checked and tightened 03/07/21 1935  Color/Movement/Sensation Capillary refill less than 3 sec 03/07/21 1935  Dressing Type Transparent 03/07/21 1935  Dressing Status Clean;Dry;Intact 03/07/21 1935   Interventions Dressing changed;Dressing reinforced;Antimicrobial disc changed;New dressing 03/03/21 0236  Dressing Change Due 03/09/21 03/07/21 1935     NG/OG Vented/Dual Lumen Orogastric 16 Fr. Oral (Active)  Tube Position (Required) External length of tube 03/07/21 2000  Measurement (cm) (Required) 55 cm 03/07/21 2000  Ongoing Placement Verification (Required) (See row information) Yes 03/07/21 2000  Site Assessment Clean;Dry;Intact 03/07/21 2000  Interventions Clamped 03/07/21 1200  Status Clamped 03/07/21 2000  Drainage Appearance Green;Bile 03/05/21 2000  Output (mL) 0 mL 02/25/21 2200     Flatus Tube/Pouch (Active)  Daily care Skin around tube assessed 03/07/21 2000  Output (mL) 75 mL 03/08/21 0600     Urethral Catheter [name], RN Non-latex 16 Fr. (Active)  Indication for Insertion or Continuance of Catheter Acute urinary retention (I&O Cath for 24 hrs prior to catheter insertion- Inpatient Only) 03/07/21 2000  Site Assessment Clean;Intact 03/07/21 2000  Catheter Maintenance Bag below level of bladder;Catheter secured;Insertion date on drainage bag;Drainage bag/tubing not touching floor;Seal intact;No dependent loops;Bag emptied prior to transport 03/07/21 2000  Collection Container Standard drainage bag 03/07/21 2000  Securement Method Securing device (Describe) 03/07/21 2000  Urinary Catheter Interventions (if applicable) Unclamped 03/07/21 1200  Output (mL) 200 mL 03/08/21 0600    Microbiology/Sepsis markers: Results for orders placed or performed during the hospital encounter of 02/15/21  Resp Panel by RT-PCR (Flu A&B, Covid) Nasopharyngeal Swab     Status: None   Collection Time: 02/15/21  8:14 PM   Specimen: Nasopharyngeal Swab; Nasopharyngeal(NP) swabs in vial transport medium  Result Value Ref Range Status   SARS Coronavirus 2 by RT PCR NEGATIVE NEGATIVE Final    Comment: (NOTE) SARS-CoV-2 target  nucleic acids are NOT DETECTED.  The SARS-CoV-2 RNA is  generally detectable in upper respiratory specimens during the acute phase of infection. The lowest concentration of SARS-CoV-2 viral copies this assay can detect is 138 copies/mL. A negative result does not preclude SARS-Cov-2 infection and should not be used as the sole basis for treatment or other patient management decisions. A negative result may occur with  improper specimen collection/handling, submission of specimen other than nasopharyngeal swab, presence of viral mutation(s) within the areas targeted by this assay, and inadequate number of viral copies(<138 copies/mL). A negative result must be combined with clinical observations, patient history, and epidemiological information. The expected result is Negative.  Fact Sheet for Patients:  BloggerCourse.comhttps://www.fda.gov/media/152166/download  Fact Sheet for Healthcare Providers:  SeriousBroker.ithttps://www.fda.gov/media/152162/download  This test is no t yet approved or cleared by the [place] FDA and  has been authorized for detection and/or diagnosis of SARS-CoV-2 by FDA under an Emergency Use Authorization (EUA). This EUA will remain  in effect (meaning this test can be used) for the duration of the COVID-19 declaration under Section 564(b)(1) of the Act, 21 U.S.C.section 360bbb-3(b)(1), unless the authorization is terminated  or revoked sooner.       Influenza A by PCR NEGATIVE NEGATIVE Final   Influenza B by PCR NEGATIVE NEGATIVE Final    Comment: (NOTE) The Xpert Xpress SARS-CoV-2/FLU/RSV plus assay is intended as an aid in the diagnosis of influenza from Nasopharyngeal swab specimens and should not be used as a sole basis for treatment. Nasal washings and aspirates are unacceptable for Xpert Xpress SARS-CoV-2/FLU/RSV testing.  Fact Sheet for Patients: BloggerCourse.comhttps://www.fda.gov/media/152166/download  Fact Sheet for Healthcare Providers: SeriousBroker.ithttps://www.fda.gov/media/152162/download  This test is not yet approved or cleared by the [place] FDA and has been authorized for detection and/or diagnosis of SARS-CoV-2 by FDA under an Emergency Use Authorization (EUA). This EUA will remain in effect (meaning this test can be used) for the duration of the COVID-19 declaration under Section 564(b)(1) of the Act, 21 U.S.C. section 360bbb-3(b)(1), unless the authorization is terminated or revoked.  Performed at [facility], [address] [place], [place] [address]   MRSA Next Gen by PCR, Nasal     Status: None   Collection Time: 02/15/21  9:58 PM   Specimen: Nasal Mucosa; Nasal Swab  Result Value Ref Range Status   MRSA by PCR Next Gen NOT DETECTED NOT DETECTED Final    Comment: (NOTE) The GeneXpert MRSA Assay (FDA approved for NASAL specimens only), is one component of a comprehensive MRSA colonization surveillance program. It is not intended to diagnose MRSA infection nor to guide or monitor treatment for MRSA infections. Test performance is not FDA approved in patients less than 36 years old. Performed at [facility], [address] [place], [place] [address]   Culture, Respiratory w Gram Stain     Status: None   Collection Time: 02/17/21  2:04 PM   Specimen: Tracheal Aspirate; Respiratory  Result Value Ref Range Status   Specimen Description TRACHEAL ASPIRATE  Final   Special Requests NONE  Final   Gram Stain   Final    ABUNDANT WBC PRESENT,BOTH PMN AND MONONUCLEAR ABUNDANT GRAM NEGATIVE RODS MODERATE GRAM POSITIVE COCCI RARE GRAM POSITIVE RODS    Culture   Final    FEW Normal respiratory flora-no Staph aureus or Pseudomonas seen Performed at [facility], [address] [place], [place] [address]    Report Status 02/19/2021 FINAL  Final  Culture, blood (routine x 2)     Status: None   Collection Time: 02/17/21  2:24 PM   Specimen: BLOOD  Result Value Ref Range Status   Specimen Description BLOOD BLOOD RIGHT FOREARM  Final   Special Requests   Final    BOTTLES DRAWN AEROBIC AND  ANAEROBIC Blood Culture adequate volume   Culture   Final    NO GROWTH 5 DAYS Performed at [facility], [address] [place], [place] [address]    Report Status 02/22/2021 FINAL  Final  Culture, blood (routine x 2)     Status: None   Collection Time: 02/17/21  3:01 PM   Specimen: BLOOD  Result Value Ref Range Status   Specimen Description BLOOD LEFT ANTECUBITAL  Final   Special Requests   Final    BOTTLES DRAWN AEROBIC AND ANAEROBIC Blood Culture adequate volume   Culture   Final    NO GROWTH 5 DAYS Performed at [facility], [address], [place], [place] [address]    Report Status 02/22/2021 FINAL  Final  Culture, Respiratory w Gram Stain     Status: None   Collection Time: 02/20/21 12:49 PM   Specimen: Tracheal Aspirate; Respiratory  Result Value Ref Range Status   Specimen Description TRACHEAL ASPIRATE  Final   Special Requests NONE  Final   Gram Stain   Final    MODERATE WBC PRESENT,BOTH PMN AND MONONUCLEAR ABUNDANT GRAM POSITIVE COCCI IN PAIRS ABUNDANT GRAM NEGATIVE RODS Performed at [facility], [address], [place], [place] [address]    Culture   Final    ABUNDANT METHICILLIN RESISTANT STAPHYLOCOCCUS AUREUS ABUNDANT PSEUDOMONAS AERUGINOSA    Report Status 02/23/2021 FINAL  Final   Organism ID, Bacteria METHICILLIN RESISTANT STAPHYLOCOCCUS AUREUS  Final   Organism ID, Bacteria PSEUDOMONAS AERUGINOSA  Final      Susceptibility   Methicillin resistant staphylococcus aureus - MIC*    CIPROFLOXACIN <=0.5 SENSITIVE Sensitive     ERYTHROMYCIN <=0.25 SENSITIVE Sensitive     GENTAMICIN <=0.5 SENSITIVE Sensitive     OXACILLIN >=4 RESISTANT Resistant     TETRACYCLINE <=1 SENSITIVE Sensitive     VANCOMYCIN 1 SENSITIVE Sensitive     TRIMETH/SULFA <=10 SENSITIVE Sensitive     CLINDAMYCIN <=0.25 SENSITIVE Sensitive     RIFAMPIN <=0.5 SENSITIVE Sensitive     Inducible Clindamycin NEGATIVE Sensitive     * ABUNDANT METHICILLIN RESISTANT  STAPHYLOCOCCUS AUREUS   Pseudomonas aeruginosa - MIC*    CEFTAZIDIME 4 SENSITIVE Sensitive     CIPROFLOXACIN 0.5 SENSITIVE Sensitive     GENTAMICIN <=1 SENSITIVE Sensitive     IMIPENEM 2 SENSITIVE Sensitive     PIP/TAZO 8 SENSITIVE Sensitive     CEFEPIME 2 SENSITIVE Sensitive     * ABUNDANT PSEUDOMONAS AERUGINOSA  Monkeypox Virus DNA, Qualitative Real-Time PCR     Status: None   Collection Time: 02/23/21  1:42 AM   Specimen: Arm, Right; Sterile Swab  Result Value Ref Range Status   Orthopoxvirus DNA, QL PCR NOT DETECTED NOT DETECTED Final    Comment: (NOTE) Non-variola Orthopoxvirus DNA not detected by real-time PCR. Performed At: [facility] [address] [place], [place] [id number] Jolene Schimke MD ZY:[id number]   Culture, Respiratory w Gram Stain     Status: None   Collection Time: 02/23/21 10:05 AM   Specimen: Tracheal Aspirate; Respiratory  Result Value Ref Range Status   Specimen Description TRACHEAL ASPIRATE  Final   Special  Requests NONE  Final   Gram Stain   Final    ABUNDANT WBC PRESENT, PREDOMINANTLY PMN FEW GRAM NEGATIVE RODS RARE GRAM POSITIVE COCCI Performed at [facility], [address] [place], [place] [address]    Culture   Final    MODERATE PSEUDOMONAS AERUGINOSA RARE METHICILLIN RESISTANT STAPHYLOCOCCUS AUREUS    Report Status 02/26/2021 FINAL  Final   Organism ID, Bacteria PSEUDOMONAS AERUGINOSA  Final   Organism ID, Bacteria METHICILLIN RESISTANT STAPHYLOCOCCUS AUREUS  Final      Susceptibility   Methicillin resistant staphylococcus aureus - MIC*    CIPROFLOXACIN <=0.5 SENSITIVE Sensitive     ERYTHROMYCIN <=0.25 SENSITIVE Sensitive     GENTAMICIN <=0.5 SENSITIVE Sensitive     OXACILLIN >=4 RESISTANT Resistant     TETRACYCLINE <=1 SENSITIVE Sensitive     VANCOMYCIN <=0.5 SENSITIVE Sensitive     TRIMETH/SULFA <=10 SENSITIVE Sensitive     CLINDAMYCIN <=0.25 SENSITIVE Sensitive     RIFAMPIN <=0.5 SENSITIVE Sensitive     Inducible  Clindamycin NEGATIVE Sensitive     * RARE METHICILLIN RESISTANT STAPHYLOCOCCUS AUREUS   Pseudomonas aeruginosa - MIC*    CEFTAZIDIME 4 SENSITIVE Sensitive     CIPROFLOXACIN <=0.25 SENSITIVE Sensitive     GENTAMICIN <=1 SENSITIVE Sensitive     IMIPENEM 1 SENSITIVE Sensitive     PIP/TAZO 8 SENSITIVE Sensitive     CEFEPIME 2 SENSITIVE Sensitive     * MODERATE PSEUDOMONAS AERUGINOSA  Culture, blood (Routine X 2) w Reflex to ID Panel     Status: None   Collection Time: 02/24/21 10:53 AM   Specimen: BLOOD RIGHT HAND  Result Value Ref Range Status   Specimen Description BLOOD RIGHT HAND  Final   Special Requests   Final    BOTTLES DRAWN AEROBIC AND ANAEROBIC Blood Culture adequate volume   Culture   Final    NO GROWTH 5 DAYS Performed at [facility], [address], [place], [place] [address]    Report Status 03/01/2021 FINAL  Final  Culture, blood (Routine X 2) w Reflex to ID Panel     Status: None   Collection Time: 02/24/21 10:58 AM   Specimen: BLOOD LEFT HAND  Result Value Ref Range Status   Specimen Description BLOOD LEFT HAND  Final   Special Requests   Final    BOTTLES DRAWN AEROBIC AND ANAEROBIC Blood Culture adequate volume   Culture   Final    NO GROWTH 5 DAYS Performed at [facility], [address], [place], [place] [address]    Report Status 03/01/2021 FINAL  Final  SARS CORONAVIRUS 2 (TAT 6-24 HRS) Nasopharyngeal Nasopharyngeal Swab     Status: None   Collection Time: 02/25/21  9:20 AM   Specimen: Nasopharyngeal Swab  Result Value Ref Range Status   SARS Coronavirus 2 NEGATIVE NEGATIVE Final    Comment: (NOTE) SARS-CoV-2 target nucleic acids are NOT DETECTED.  The SARS-CoV-2 RNA is generally detectable in upper and lower respiratory specimens during the acute phase of infection. Negative results do not preclude SARS-CoV-2 infection, do not rule out co-infections with other pathogens, and should not be used as the sole basis for treatment or other  patient management decisions. Negative results must be combined with clinical observations, patient history, and epidemiological information. The expected result is Negative.  Fact Sheet for Patients: HairSlick.no  Fact Sheet for Healthcare Providers: quierodirigir.com  This test is not yet approved or cleared by the [place]  FDA and  has been authorized for detection and/or diagnosis of SARS-CoV-2 by FDA under an Emergency Use Authorization (EUA). This EUA will remain  in effect (meaning this test can be used) for the duration of the COVID-19 declaration under Se ction 564(b)(1) of the Act, 21 U.S.C. section 360bbb-3(b)(1), unless the authorization is terminated or revoked sooner.  Performed at [facility], [address], [place], [place] [address]   Culture, Respiratory w Gram Stain     Status: None   Collection Time: 03/04/21 10:37 AM   Specimen: Tracheal Aspirate; Respiratory  Result Value Ref Range Status   Specimen Description TRACHEAL ASPIRATE  Final   Special Requests NONE  Final   Gram Stain   Final    FEW SQUAMOUS EPITHELIAL CELLS PRESENT MODERATE WBC PRESENT, PREDOMINANTLY PMN MODERATE GRAM NEGATIVE RODS    Culture   Final    FEW PSEUDOMONAS AERUGINOSA NO STAPHYLOCOCCUS AUREUS ISOLATED Performed at [facility], [address], [place], [place] [address]    Report Status 03/07/2021 FINAL  Final   Organism ID, Bacteria PSEUDOMONAS AERUGINOSA  Final      Susceptibility   Pseudomonas aeruginosa - MIC*    CEFTAZIDIME 16 INTERMEDIATE Intermediate     CIPROFLOXACIN <=0.25 SENSITIVE Sensitive     GENTAMICIN <=1 SENSITIVE Sensitive     IMIPENEM 2 SENSITIVE Sensitive     * FEW PSEUDOMONAS AERUGINOSA  Culture, Respiratory w Gram Stain     Status: None (Preliminary result)   Collection Time: 03/06/21  1:56 PM   Specimen: Tracheal Aspirate; Respiratory  Result Value Ref Range Status   Specimen  Description TRACHEAL ASPIRATE  Final   Special Requests NONE  Final   Gram Stain   Final    MODERATE SQUAMOUS EPITHELIAL CELLS PRESENT MODERATE WBC PRESENT, PREDOMINANTLY PMN FEW GRAM NEGATIVE RODS FEW GRAM POSITIVE COCCI    Culture   Final    ABUNDANT PSEUDOMONAS AERUGINOSA CULTURE REINCUBATED FOR BETTER GROWTH Performed at [facility], [address] [place], [place] [address]    Report Status PENDING  Incomplete  Aerobic Culture w Gram Stain (superficial specimen)     Status: None (Preliminary result)   Collection Time: 03/06/21  3:13 PM   Specimen: Wound  Result Value Ref Range Status   Specimen Description WOUND  Final   Special Requests EAR RIGHT  Final   Gram Stain   Final    ABUNDANT WBC PRESENT,BOTH PMN AND MONONUCLEAR RARE SQUAMOUS EPITHELIAL CELLS PRESENT ABUNDANT GRAM NEGATIVE RODS FEW GRAM VARIABLE ROD FEW GRAM POSITIVE COCCI FEW YEAST Performed at [facility], [address] [place], [place] [address]    Culture MODERATE ENTEROBACTER SPECIES  Final   Report Status PENDING  Incomplete    Anti-infectives:  Anti-infectives (From admission, onward)    Start     Dose/Rate Route Frequency Ordered Stop   03/06/21 1415  piperacillin-tazobactam (ZOSYN) IVPB 3.375 g        3.375 g 12.5 mL/hr over 240 Minutes Intravenous Every 8 hours 03/06/21 1324 03/10/21 1353   02/24/21 1200  vancomycin (VANCOCIN) IVPB 1000 mg/200 mL premix        1,000 mg 200 mL/hr over 60 Minutes Intravenous Every 8 hours 02/24/21 0934 03/02/21 2020   02/23/21 0200  vancomycin (VANCOREADY) IVPB 1500 mg/300 mL  Status:  Discontinued        1,500 mg 150 mL/hr over 120 Minutes Intravenous Every 8 hours 02/22/21 1931 02/24/21 0934  02/22/21 2100  vancomycin (VANCOCIN) 250 mg in sodium chloride 0.9 % 100 mL IVPB        250 mg 100 mL/hr over 60 Minutes Intravenous  Once 02/22/21 2012 02/22/21 2150   02/22/21 2030  vancomycin (VANCOCIN) 250 mg in sodium chloride 0.9 % 500 mL IVPB   Status:  Discontinued        250 mg 250 mL/hr over 120 Minutes Intravenous  Once 02/22/21 1931 02/22/21 2012   02/21/21 1800  vancomycin (VANCOREADY) IVPB 1250 mg/250 mL  Status:  Discontinued        1,250 mg 166.7 mL/hr over 90 Minutes Intravenous Every 8 hours 02/21/21 0851 02/22/21 1931   02/21/21 1000  ceFEPIme (MAXIPIME) 2 g in sodium chloride 0.9 % 100 mL IVPB        2 g 200 mL/hr over 30 Minutes Intravenous Every 8 hours 02/21/21 0851 03/02/21 1749   02/21/21 1000  vancomycin (VANCOREADY) IVPB 2000 mg/400 mL        2,000 mg 200 mL/hr over 120 Minutes Intravenous  Once 02/21/21 0851 02/21/21 1306       Best Practice/Protocols:  VTE Prophylaxis: Lovenox (full dose) Continous Sedation  Consults: Treatment Team:  Serena Colonel, MD Lisbeth Renshaw, MD Myrene Galas, MD    Studies:    Events:  Subjective:    Overnight Issues:   Objective:  Vital signs for last 24 hours: Temp:  [98.6 F (37 C)-101.6 F (38.7 C)] 99.3 F (37.4 C) (08/30 0825) Pulse Rate:  [13-139] 139 (08/30 0757) Resp:  [21-32] 25 (08/30 0757) BP: (129-167)/(67-99) 150/82 (08/30 0757) SpO2:  [9 %-96 %] 94 % (08/30 0757) Arterial Line BP: (131-171)/(71-87) 151/84 (08/30 0600) FiO2 (%):  [50 %-60 %] 60 % (08/30 0757)  Hemodynamic parameters for last 24 hours:    Intake/Output from previous day: 08/29 0701 - 08/30 0700 In: 1652.5 [I.V.:153.5; NG/GT:1150; IV Piggyback:349.1] Out: 3205 [Urine:3030; Stool:175]  Intake/Output this shift: No intake/output data recorded.  Vent settings for last 24 hours: Vent Mode: PRVC FiO2 (%):  [50 %-60 %] 60 % Set Rate:  [22 bmp] 22 bmp Vt Set:  [620 mL] 620 mL PEEP:  [8 cmH20] 8 cmH20 Plateau Pressure:  [20 cmH20-22 cmH20] 20 cmH20  Physical Exam:  General: on vent Neuro: moves LUE, not F/C HEENT/Neck: ETT Resp: rhonchi bilaterally CVS: RRR GI: NT, ND Extremities: no edema  Results for orders placed or performed during the hospital encounter  of 02/15/21 (from the past 24 hour(s))  Glucose, capillary     Status: Abnormal   Collection Time: 03/07/21 11:49 AM  Result Value Ref Range   Glucose-Capillary 131 (H) 70 - 99 mg/dL  Glucose, capillary     Status: Abnormal   Collection Time: 03/07/21  3:00 PM  Result Value Ref Range   Glucose-Capillary 144 (H) 70 - 99 mg/dL  I-STAT 7, (LYTES, BLD GAS, ICA, H+H)     Status: Abnormal   Collection Time: 03/07/21  3:21 PM  Result Value Ref Range   pH, Arterial 7.354 7.350 - 7.450   pCO2 arterial 51.5 (H) 32.0 - 48.0 mmHg   pO2, Arterial 101 83.0 - 108.0 mmHg   Bicarbonate 28.2 (H) 20.0 - 28.0 mmol/L   TCO2 30 22 - 32 mmol/L   O2 Saturation 97.0 %   Acid-Base Excess 2.0 0.0 - 2.0 mmol/L   Sodium 144 135 - 145 mmol/L   Potassium 3.6 3.5 - 5.1 mmol/L   Calcium, Ion 1.32 1.15 - 1.40 mmol/L  HCT 33.0 (L) 39.0 - 52.0 %   Hemoglobin 11.2 (L) 13.0 - 17.0 g/dL   Patient temperature 295.6 F    Collection site Radial    Drawn by VP    Sample type ARTERIAL   Glucose, capillary     Status: Abnormal   Collection Time: 03/07/21  7:28 PM  Result Value Ref Range   Glucose-Capillary 142 (H) 70 - 99 mg/dL  Glucose, capillary     Status: Abnormal   Collection Time: 03/07/21 11:38 PM  Result Value Ref Range   Glucose-Capillary 139 (H) 70 - 99 mg/dL  Glucose, capillary     Status: Abnormal   Collection Time: 03/08/21  3:21 AM  Result Value Ref Range   Glucose-Capillary 133 (H) 70 - 99 mg/dL  I-STAT 7, (LYTES, BLD GAS, ICA, H+H)     Status: Abnormal   Collection Time: 03/08/21  3:38 AM  Result Value Ref Range   pH, Arterial 7.367 7.350 - 7.450   pCO2 arterial 46.6 32.0 - 48.0 mmHg   pO2, Arterial 100 83.0 - 108.0 mmHg   Bicarbonate 26.9 20.0 - 28.0 mmol/L   TCO2 28 22 - 32 mmol/L   O2 Saturation 98.0 %   Acid-Base Excess 1.0 0.0 - 2.0 mmol/L   Sodium 147 (H) 135 - 145 mmol/L   Potassium 3.0 (L) 3.5 - 5.1 mmol/L   Calcium, Ion 1.33 1.15 - 1.40 mmol/L   HCT 38.0 (L) 39.0 - 52.0 %    Hemoglobin 12.9 (L) 13.0 - 17.0 g/dL   Patient temperature 21.3 F    Collection site Radial    Drawn by RT    Sample type ARTERIAL   Comprehensive metabolic panel     Status: Abnormal   Collection Time: 03/08/21  6:12 AM  Result Value Ref Range   Sodium 143 135 - 145 mmol/L   Potassium 3.2 (L) 3.5 - 5.1 mmol/L   Chloride 107 98 - 111 mmol/L   CO2 25 22 - 32 mmol/L   Glucose, Bld 141 (H) 70 - 99 mg/dL   BUN 34 (H) 6 - 20 mg/dL   Creatinine, Ser 0.86 (L) 0.61 - 1.24 mg/dL   Calcium 9.9 8.9 - 57.8 mg/dL   Total Protein 9.2 (H) 6.5 - 8.1 g/dL   Albumin 2.2 (L) 3.5 - 5.0 g/dL   AST 469 (H) 15 - 41 U/L   ALT 233 (H) 0 - 44 U/L   Alkaline Phosphatase 301 (H) 38 - 126 U/L   Total Bilirubin 1.0 0.3 - 1.2 mg/dL   GFR, Estimated >62 >95 mL/min   Anion gap 11 5 - 15  CBC with Differential/Platelet     Status: Abnormal   Collection Time: 03/08/21  6:12 AM  Result Value Ref Range   WBC 17.8 (H) 4.0 - 10.5 K/uL   RBC 3.41 (L) 4.22 - 5.81 MIL/uL   Hemoglobin 10.7 (L) 13.0 - 17.0 g/dL   HCT 28.4 (L) 13.2 - 44.0 %   MCV 98.2 80.0 - 100.0 fL   MCH 31.4 26.0 - 34.0 pg   MCHC 31.9 30.0 - 36.0 g/dL   RDW 10.2 (H) 72.5 - 36.6 %   Platelets 611 (H) 150 - 400 K/uL   nRBC 0.0 0.0 - 0.2 %   Neutrophils Relative % 72 %   Neutro Abs 12.7 (H) 1.7 - 7.7 K/uL   Lymphocytes Relative 11 %   Lymphs Abs 2.0 0.7 - 4.0 K/uL   Monocytes Relative 12 %  Monocytes Absolute 2.2 (H) 0.1 - 1.0 K/uL   Eosinophils Relative 1 %   Eosinophils Absolute 0.2 0.0 - 0.5 K/uL   Basophils Relative 1 %   Basophils Absolute 0.1 0.0 - 0.1 K/uL   Immature Granulocytes 3 %   Abs Immature Granulocytes 0.60 (H) 0.00 - 0.07 K/uL  Magnesium     Status: None   Collection Time: 03/08/21  6:12 AM  Result Value Ref Range   Magnesium 2.1 1.7 - 2.4 mg/dL  Glucose, capillary     Status: Abnormal   Collection Time: 03/08/21  8:04 AM  Result Value Ref Range   Glucose-Capillary 127 (H) 70 - 99 mg/dL    Assessment & Plan: Present  on Admission:  Traumatic brain injury (HCC)    LOS: 21 days   Additional comments:I reviewed the patient's new clinical lab test results. Marland Kitchen [facility]   SAH, 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 Conchita Paris, keppra x7d for sz ppx, repeat 4V Angio CT of neck 8/21 negative. 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 Continue to assess with lowering sedation G1 L ICA injury - ASA 325 Facial laceration involving left eyelid - ENT c/s, Dr. Pollyann Kennedy, repaired 8/9 Skull fx extending ito L TMJ, L sphenoid sinus, R mastoid; R nasal bone fx - ENT c/s, Dr. Pollyann Kennedy, repeat exam when more neurologically appropriate R calf DVT - LMWH Severe ARDS - improving, 60% and PEEP 8, trach once improves further ID/PNA - Vanc for MRSA PNA, completed cefepime for pseud PNA. Secretions improved, WBC 17.8 & repeat resp CX with Pseudomonas again. Suspect colonization, but empiric zosyn started 8/28 - will do 5d course. Monkey pox negative (lesions R forearm).  CV/Hypertensive urgency - off cleviprex gtt, incr metoprolol to 100 BID  L clavicle and L scapula fx - now non-op per Dr. [name] rash, scattered abrasions - local wound care Alcohol abuse - CIWA, TOC c/s H/o tobacco use disorder  FEN - cortrak, goal TF, dulcolax supp, replete hypokalemia DVT - SCDs, R calf DVT - therapeutic LMWH Foley - to remain Dispo - ICU, lopressor for HR, I updated his brother at the bedside Critical Care Total Time*: 40 Minutes  Violeta Gelinas, MD, MPH, FACS Trauma & General Surgery Use [email or web address] to contact on call provider  03/08/2021  *Care during the described time interval was provided by me. I have reviewed this patient's available data, including medical history, events of note, physical examination and test results as part of my evaluation.

## 2021-03-09 ENCOUNTER — Inpatient Hospital Stay (HOSPITAL_COMMUNITY): Payer: 59

## 2021-03-09 LAB — CULTURE, RESPIRATORY W GRAM STAIN

## 2021-03-09 LAB — URINALYSIS, ROUTINE W REFLEX MICROSCOPIC
Bilirubin Urine: NEGATIVE
Glucose, UA: NEGATIVE mg/dL
Ketones, ur: NEGATIVE mg/dL
Leukocytes,Ua: NEGATIVE
Nitrite: NEGATIVE
Protein, ur: NEGATIVE mg/dL
RBC / HPF: >50 RBC/hpf — ABNORMAL HIGH (ref 0–5)
Specific Gravity, Urine: 1.025 (ref 1.005–1.030)
pH: 5 (ref 5.0–8.0)

## 2021-03-09 LAB — GLUCOSE, CAPILLARY
Glucose-Capillary: 169 mg/dL — ABNORMAL HIGH (ref 70–99)
Glucose-Capillary: 141 mg/dL — ABNORMAL HIGH (ref 70–99)
Glucose-Capillary: 140 mg/dL — ABNORMAL HIGH (ref 70–99)
Glucose-Capillary: 139 mg/dL — ABNORMAL HIGH (ref 70–99)
Glucose-Capillary: 127 mg/dL — ABNORMAL HIGH (ref 70–99)
Glucose-Capillary: 122 mg/dL — ABNORMAL HIGH (ref 70–99)

## 2021-03-09 LAB — CBC WITH DIFFERENTIAL/PLATELET
Abs Immature Granulocytes: 0.8 10*3/uL — ABNORMAL HIGH (ref 0.00–0.07)
Basophils Absolute: 0.1 10*3/uL (ref 0.0–0.1)
Basophils Relative: 1 %
Eosinophils Absolute: 0 10*3/uL (ref 0.0–0.5)
Eosinophils Relative: 0 %
HCT: 32 % — ABNORMAL LOW (ref 39.0–52.0)
Hemoglobin: 9.9 g/dL — ABNORMAL LOW (ref 13.0–17.0)
Immature Granulocytes: 4 %
Lymphocytes Relative: 12 %
Lymphs Abs: 2.4 10*3/uL (ref 0.7–4.0)
MCH: 31 pg (ref 26.0–34.0)
MCHC: 30.9 g/dL (ref 30.0–36.0)
MCV: 100.3 fL — ABNORMAL HIGH (ref 80.0–100.0)
Monocytes Absolute: 2.6 10*3/uL — ABNORMAL HIGH (ref 0.1–1.0)
Monocytes Relative: 12 %
Neutro Abs: 15.1 10*3/uL — ABNORMAL HIGH (ref 1.7–7.7)
Neutrophils Relative %: 71 %
Platelets: 504 10*3/uL — ABNORMAL HIGH (ref 150–400)
RBC: 3.19 MIL/uL — ABNORMAL LOW (ref 4.22–5.81)
RDW: 16.3 % — ABNORMAL HIGH (ref 11.5–15.5)
WBC: 21 10*3/uL — ABNORMAL HIGH (ref 4.0–10.5)
nRBC: 0 % (ref 0.0–0.2)

## 2021-03-09 LAB — TRIGLYCERIDES: Triglycerides: 182 mg/dL — ABNORMAL HIGH (ref <150)

## 2021-03-09 LAB — COMPREHENSIVE METABOLIC PANEL
ALT: 197 U/L — ABNORMAL HIGH (ref 0–44)
AST: 93 U/L — ABNORMAL HIGH (ref 15–41)
Albumin: 2 g/dL — ABNORMAL LOW (ref 3.5–5.0)
Alkaline Phosphatase: 272 U/L — ABNORMAL HIGH (ref 38–126)
Anion gap: 7 (ref 5–15)
BUN: 53 mg/dL — ABNORMAL HIGH (ref 6–20)
CO2: 23 mmol/L (ref 22–32)
Calcium: 9.4 mg/dL (ref 8.9–10.3)
Chloride: 116 mmol/L — ABNORMAL HIGH (ref 98–111)
Creatinine, Ser: 0.64 mg/dL (ref 0.61–1.24)
GFR, Estimated: >60 mL/min (ref >60)
Glucose, Bld: 145 mg/dL — ABNORMAL HIGH (ref 70–99)
Potassium: 3.7 mmol/L (ref 3.5–5.1)
Sodium: 146 mmol/L — ABNORMAL HIGH (ref 135–145)
Total Bilirubin: 0.9 mg/dL (ref 0.3–1.2)
Total Protein: 8 g/dL (ref 6.5–8.1)

## 2021-03-09 LAB — AEROBIC CULTURE W GRAM STAIN (SUPERFICIAL SPECIMEN)

## 2021-03-09 LAB — MAGNESIUM: Magnesium: 2.6 mg/dL — ABNORMAL HIGH (ref 1.7–2.4)

## 2021-03-09 MED ORDER — CIPROFLOXACIN-DEXAMETHASONE 0.3-0.1 % OT SUSP
4.0000 [drp] | Freq: Two times a day (BID) | OTIC | Status: DC
  Administered 2021-03-09 – 2021-03-23 (×29): 4 [drp] via OTIC
  Filled 2021-03-09 (×2): qty 7.5

## 2021-03-09 MED ORDER — POTASSIUM CHLORIDE 20 MEQ PO PACK
40.0000 meq | PACK | Freq: Once | ORAL | Status: AC
  Administered 2021-03-09: 40 meq
  Filled 2021-03-09: qty 2

## 2021-03-09 MED ORDER — PROPRANOLOL HCL 10 MG PO TABS
10.0000 mg | ORAL_TABLET | ORAL | Status: AC
  Administered 2021-03-09: 10 mg
  Filled 2021-03-09: qty 1

## 2021-03-09 MED ORDER — IOHEXOL 350 MG/ML SOLN
100.0000 mL | Freq: Once | INTRAVENOUS | Status: AC | PRN
  Administered 2021-03-09: 100 mL via INTRAVENOUS

## 2021-03-09 MED ORDER — GADOBUTROL 1 MMOL/ML IV SOLN
7.0000 mL | Freq: Once | INTRAVENOUS | Status: AC | PRN
  Administered 2021-03-09: 7 mL via INTRAVENOUS

## 2021-03-09 MED ORDER — METOPROLOL TARTRATE 50 MG PO TABS
50.0000 mg | ORAL_TABLET | Freq: Once | ORAL | Status: AC
  Administered 2021-03-09: 50 mg
  Filled 2021-03-09: qty 1

## 2021-03-09 MED ORDER — PIVOT 1.5 CAL PO LIQD
1000.0000 mL | ORAL | Status: DC
  Administered 2021-03-09 – 2021-03-10 (×2): 1000 mL
  Filled 2021-03-09: qty 1000

## 2021-03-09 MED ORDER — PROPRANOLOL HCL 20 MG/5ML PO SOLN: 10.0000 mg | Freq: Once | ORAL | Status: DC

## 2021-03-09 MED ORDER — METOPROLOL TARTRATE 50 MG PO TABS
150.0000 mg | ORAL_TABLET | Freq: Two times a day (BID) | ORAL | Status: DC
  Administered 2021-03-09 – 2021-03-10 (×3): 150 mg
  Filled 2021-03-09 (×3): qty 3

## 2021-03-09 MED ORDER — FUROSEMIDE 10 MG/ML IJ SOLN
40.0000 mg | Freq: Once | INTRAMUSCULAR | Status: AC
  Administered 2021-03-09: 40 mg via INTRAVENOUS
  Filled 2021-03-09: qty 4

## 2021-03-09 MED ORDER — SODIUM CHLORIDE 0.9% FLUSH
10.0000 mL | Freq: Two times a day (BID) | INTRAVENOUS | Status: DC
  Administered 2021-03-10 – 2021-03-21 (×22): 10 mL
  Administered 2021-03-21: 30 mL
  Administered 2021-03-22: 40 mL
  Administered 2021-03-22 – 2021-03-24 (×4): 10 mL
  Administered 2021-03-24: 20 mL
  Administered 2021-03-25 – 2021-03-29 (×8): 10 mL
  Administered 2021-03-29: 30 mL
  Administered 2021-03-30 – 2021-04-01 (×5): 10 mL
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
## 2021-03-09 NOTE — Progress Notes (Signed)
Results of CT imaging reviewed and discussed with radiologist. Concern for subdural empyema on CT head. Discussed with NSGY, Dr. Conchita Paris, and recs for MRI brain. If MRI concordant, will plan for operative exploration/debridement in AM. CT chest notable for R PTX. No PTX on most recent CXR 8/29 and respiratory status improving. Will defer thoracostomy at this time and plan for CXR in AM.   Additional critical care time:  Diamantina Monks, MD General and Trauma Surgery [facility]
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
## 2021-03-09 NOTE — Progress Notes (Addendum)
Patient was transported to MRI and back to 4N21 via the ventilator with no complications.

## 2021-03-09 NOTE — Progress Notes (Signed)
Dr. Bedelia Person was notified of patients increased HR and RR despite medication titration. Lovick gave V/O and orders were implemented. Christiane Ha form Rx reviewed my medications and titrations. Intervention was successful. Will continue to monitor.

## 2021-03-09 NOTE — Progress Notes (Signed)
Pt transported to CT and back to 4N21 without any complications.  

## 2021-03-10 ENCOUNTER — Inpatient Hospital Stay (HOSPITAL_COMMUNITY): Payer: 59

## 2021-03-10 DIAGNOSIS — R509 Fever, unspecified: Secondary | ICD-10-CM

## 2021-03-10 DIAGNOSIS — I82409 Acute embolism and thrombosis of unspecified deep veins of unspecified lower extremity: Secondary | ICD-10-CM

## 2021-03-10 DIAGNOSIS — Z86718 Personal history of other venous thrombosis and embolism: Secondary | ICD-10-CM

## 2021-03-10 LAB — POCT I-STAT 7, (LYTES, BLD GAS, ICA,H+H)
Acid-base deficit: 1 mmol/L (ref 0.0–2.0)
Bicarbonate: 23.4 mmol/L (ref 20.0–28.0)
Calcium, Ion: 1.32 mmol/L (ref 1.15–1.40)
HCT: 29 % — ABNORMAL LOW (ref 39.0–52.0)
Hemoglobin: 9.9 g/dL — ABNORMAL LOW (ref 13.0–17.0)
O2 Saturation: 95 %
Patient temperature: 98.6
Potassium: 3.4 mmol/L — ABNORMAL LOW (ref 3.5–5.1)
Sodium: 158 mmol/L — ABNORMAL HIGH (ref 135–145)
TCO2: 25 mmol/L (ref 22–32)
pCO2 arterial: 39.1 mmHg (ref 32.0–48.0)
pH, Arterial: 7.386 (ref 7.350–7.450)
pO2, Arterial: 76 mmHg — ABNORMAL LOW (ref 83.0–108.0)

## 2021-03-10 LAB — CBC WITH DIFFERENTIAL/PLATELET
Abs Immature Granulocytes: 0.52 10*3/uL — ABNORMAL HIGH (ref 0.00–0.07)
Basophils Absolute: 0.1 10*3/uL (ref 0.0–0.1)
Basophils Relative: 1 %
Eosinophils Absolute: 0 10*3/uL (ref 0.0–0.5)
Eosinophils Relative: 0 %
HCT: 31.6 % — ABNORMAL LOW (ref 39.0–52.0)
Hemoglobin: 9.6 g/dL — ABNORMAL LOW (ref 13.0–17.0)
Immature Granulocytes: 3 %
Lymphocytes Relative: 12 %
Lymphs Abs: 2.3 10*3/uL (ref 0.7–4.0)
MCH: 30.6 pg (ref 26.0–34.0)
MCHC: 30.4 g/dL (ref 30.0–36.0)
MCV: 100.6 fL — ABNORMAL HIGH (ref 80.0–100.0)
Monocytes Absolute: 1.8 10*3/uL — ABNORMAL HIGH (ref 0.1–1.0)
Monocytes Relative: 9 %
Neutro Abs: 14.5 10*3/uL — ABNORMAL HIGH (ref 1.7–7.7)
Neutrophils Relative %: 75 %
Platelets: 491 10*3/uL — ABNORMAL HIGH (ref 150–400)
RBC: 3.14 MIL/uL — ABNORMAL LOW (ref 4.22–5.81)
RDW: 16.9 % — ABNORMAL HIGH (ref 11.5–15.5)
WBC: 19.3 10*3/uL — ABNORMAL HIGH (ref 4.0–10.5)
nRBC: 0.1 % (ref 0.0–0.2)

## 2021-03-10 LAB — GLUCOSE, CAPILLARY
Glucose-Capillary: 122 mg/dL — ABNORMAL HIGH (ref 70–99)
Glucose-Capillary: 118 mg/dL — ABNORMAL HIGH (ref 70–99)
Glucose-Capillary: 154 mg/dL — ABNORMAL HIGH (ref 70–99)
Glucose-Capillary: 139 mg/dL — ABNORMAL HIGH (ref 70–99)
Glucose-Capillary: 119 mg/dL — ABNORMAL HIGH (ref 70–99)

## 2021-03-10 LAB — COMPREHENSIVE METABOLIC PANEL
ALT: 289 U/L — ABNORMAL HIGH (ref 0–44)
AST: 160 U/L — ABNORMAL HIGH (ref 15–41)
Albumin: 2.1 g/dL — ABNORMAL LOW (ref 3.5–5.0)
Alkaline Phosphatase: 229 U/L — ABNORMAL HIGH (ref 38–126)
Anion gap: 6 (ref 5–15)
BUN: 48 mg/dL — ABNORMAL HIGH (ref 6–20)
CO2: 24 mmol/L (ref 22–32)
Calcium: 9.3 mg/dL (ref 8.9–10.3)
Chloride: 122 mmol/L — ABNORMAL HIGH (ref 98–111)
Creatinine, Ser: 0.61 mg/dL (ref 0.61–1.24)
GFR, Estimated: >60 mL/min (ref >60)
Glucose, Bld: 130 mg/dL — ABNORMAL HIGH (ref 70–99)
Potassium: 3.6 mmol/L (ref 3.5–5.1)
Sodium: 152 mmol/L — ABNORMAL HIGH (ref 135–145)
Total Bilirubin: 0.8 mg/dL (ref 0.3–1.2)
Total Protein: 7.9 g/dL (ref 6.5–8.1)

## 2021-03-10 LAB — MAGNESIUM: Magnesium: 2.3 mg/dL (ref 1.7–2.4)

## 2021-03-10 MED ORDER — VANCOMYCIN HCL IN DEXTROSE 1-5 GM/200ML-% IV SOLN
1000.0000 mg | Freq: Three times a day (TID) | INTRAVENOUS | Status: DC
  Administered 2021-03-10 – 2021-03-11 (×5): 1000 mg via INTRAVENOUS
  Filled 2021-03-10 (×5): qty 200

## 2021-03-10 MED ORDER — FREE WATER
200.0000 mL | Freq: Three times a day (TID) | Status: DC
  Administered 2021-03-10 – 2021-03-15 (×15): 200 mL

## 2021-03-10 MED ORDER — PIPERACILLIN-TAZOBACTAM 3.375 G IVPB
3.3750 g | Freq: Three times a day (TID) | INTRAVENOUS | Status: DC
  Administered 2021-03-10 – 2021-03-12 (×6): 3.375 g via INTRAVENOUS
  Filled 2021-03-10 (×6): qty 50

## 2021-03-10 MED ORDER — POTASSIUM CHLORIDE 20 MEQ PO PACK
40.0000 meq | PACK | Freq: Once | ORAL | Status: AC
  Administered 2021-03-10: 40 meq
  Filled 2021-03-10: qty 2

## 2021-03-10 NOTE — Progress Notes (Signed)
Orthopedic Tech Progress Note Patient Details:  Scott [name] 07-Aug-1985 [id number]  Ortho Devices Type of Ortho Device: Prafo boot/shoe Ortho Device/Splint Location: Bilateral Ortho Device/Splint Interventions: Ordered, Application   Post Interventions Patient Tolerated: Well  [name] [name] 03/10/2021, 12:33 PM

## 2021-03-10 NOTE — Progress Notes (Signed)
Lower extremity venous and upper extremity venous study completed.   Please see CV Proc for preliminary results.   Jean Rosenthal, RDMS, RVT
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
## 2021-03-10 NOTE — Progress Notes (Signed)
Pt remains intubated, on vent with minimal sedation. Grimaces to pain. Breathing over vent. No motor responses to pain. MRI reviewed and demonstrates right temporal edema, no leptomeningeal enhancement. Unchanged dural enhancement with associated stable small subdural hematoma. Overall unlikely to represent subdural empyema. Would cont with supportive care per trauma, abx for pna and otitis externa.   Lisbeth Renshaw, MD [facility]

## 2021-03-10 NOTE — Progress Notes (Signed)
Pharmacy Antibiotic Note  Scott [name] is a 35 y.o. male admitted on 02/15/2021 with pneumonia.  Pharmacy has been consulted for vancomycin and Zosyn dosing.  Vancomycin was requested due o MRSA Cx on 8/28 tracheal aspirate. Previous administration of vancomycin of 1g Q8 8/18-8/24 with a Scr of 0.5-0.6 resulted in AUC: 535.8, P: 32 mcg/mL, T: 10 mcg/mL (therapeutic).  Zosyn was expected to complete today 9/01 after 5d of therapy. Due to continued PSA cultures, restarted zosyn at current dose. Additionally, on 8/31 noted new gram negative rods.   Plan: Vancomycin 1000 mg IV Q 8 hrs. Goal AUC 400-550. Continue Zosyn 3.375 IV Q 8 hrs (infused over 4 hrs) Monitor WBC, temp, clinical improvement, cultures, renal function, vancomycin levels   Height: 5\' 11"  (180.3 cm) Weight: 62 kg (136 lb 11 oz) IBW/kg (Calculated) : 75.3  Temp (24hrs), Avg:100.3 F (37.9 C), Min:98 F (36.7 C), Max:102.7 F (39.3 C)  Recent Labs  Lab 03/06/21 0419 03/07/21 0525 03/08/21 0612 03/09/21 0540 03/10/21 0504  WBC 22.6* 16.3* 17.8* 21.0* 19.3*  CREATININE 0.64 0.67 0.56* 0.64 0.61    Estimated Creatinine Clearance: 113 mL/min (by C-G formula based on SCr of 0.61 mg/dL).    Allergies  Allergen Reactions   Lactose Intolerance (Gi)     Antimicrobials this admission: Zosyn  3.375 g >> 8/28   Dose adjustments this admission: None  Microbiology results: 8/26 Tracheal Asp: Pseudomonas Aeruginosa (s: Cipro, Gent, Imipenem) 8/28 Tracheal Asp: Pseudomonas Aeruginosa, MRSA (PSA S to all) 8/28 Rt ear wound: Klebsiella pneumoniae (R: Ampicillin), Enterobacter Cloacae (R:cefazolin)  Thank you for allowing pharmacy to be a part of this patient's care.  9/28, PharmD Candidate 03/10/2021 8:48 AM

## 2021-03-10 NOTE — Progress Notes (Addendum)
71mL of Dilaudid IV wasted in Stericycle with Prudy Feeler, RN.

## 2021-03-10 NOTE — Progress Notes (Signed)
Patient ID: Scott [name], male   DOB: Mar 21, 1986, 35 y.o.   MRN: [id number] Follow up - Trauma Critical Care  Patient Details:    Scott [name] is an 35 y.o. male.  Lines/tubes : Airway 8 mm (Active)  Secured at (cm) 2 cm 03/10/21 0813  Measured From Lips 03/10/21 0813  Secured Location Left 03/10/21 0813  Secured By [employer] Tube Holder 03/10/21 0813  Tube Holder Repositioned Yes 03/10/21 0813  Prone position No 03/10/21 0813  Head position Left 03/09/21 0417  Cuff Pressure (cm H2O) Green OR 18-26 CmH2O 03/10/21 0813  Site Condition Dry 03/10/21 0813     PICC Triple Lumen 02/20/21 PICC Right Basilic 39 cm 0 cm (Active)  Indication for Insertion or Continuance of Line Poor Vasculature-patient has had multiple peripheral attempts or PIVs lasting less than 24 hours 03/10/21 0900  Exposed Catheter (cm) 1 cm 03/07/21 1200  Site Assessment Clean;Dry;Intact 03/10/21 0900  Lumen #1 Status Infusing 03/10/21 0900  Lumen #2 Status Infusing 03/10/21 0900  Lumen #3 Status Infusing 03/10/21 0900  Dressing Type Transparent 03/10/21 0900  Dressing Status Clean;Dry;Intact 03/10/21 0900  Antimicrobial disc in place? Yes 03/10/21 0900  Safety Lock Not Applicable 03/10/21 0900  Line Care Connections checked and tightened 03/10/21 0900  Line Adjustment (NICU/IV Team Only) No 03/03/21 0800  Dressing Intervention Dressing changed;Antimicrobial disc changed;Securement device changed 03/06/21 1600  Dressing Change Due 03/13/21 03/10/21 0900     Arterial Line 02/21/21 Left Radial (Active)  Site Assessment Clean;Dry;Intact 03/10/21 0900  Line Status Pulsatile blood flow 03/10/21 0900  Art Line Waveform Appropriate 03/10/21 0900  Art Line Interventions Zeroed and calibrated 03/09/21 0800  Color/Movement/Sensation Capillary refill less than 3 sec 03/10/21 0900  Dressing Type Transparent 03/10/21 0900  Dressing Status Clean;Dry;Intact 03/10/21 0900  Interventions Dressing changed 03/09/21 0800   Dressing Change Due 03/15/21 03/10/21 0900     NG/OG Vented/Dual Lumen Orogastric 16 Fr. Oral (Active)  Tube Position (Required) External length of tube 03/09/21 2000  Measurement (cm) (Required) 54 cm 03/09/21 2000  Ongoing Placement Verification (Required) (See row information) Yes 03/09/21 2000  Site Assessment Clean;Dry;Intact;Tape intact 03/09/21 2000  Interventions Clamped 03/09/21 2000  Status Clamped 03/09/21 0800  Drainage Appearance Green;Bile 03/05/21 2000  Output (mL) 0 mL 02/25/21 2200     Flatus Tube/Pouch (Active)  Daily care Skin around tube assessed;Assess location of position indicator line 03/09/21 2000  Output (mL) 200 mL 03/10/21 0400  Intake (mL) 30 mL 03/08/21 2200     Urethral Catheter Pieter PartridgeSusannah Washburn, RN Non-latex 16 Fr. (Active)  Indication for Insertion or Continuance of Catheter Acute urinary retention (I&O Cath for 24 hrs prior to catheter insertion- Inpatient Only);Unstable critically ill patients first 24-48 hours (See Criteria) 03/09/21 2000  Site Assessment Clean;Intact 03/10/21 0800  Catheter Maintenance Bag below level of bladder;Catheter secured;Drainage bag/tubing not touching floor;Insertion date on drainage bag;No dependent loops;Seal intact 03/10/21 0800  Collection Container Standard drainage bag 03/10/21 0800  Securement Method Securing device (Describe) 03/10/21 0800  Urinary Catheter Interventions (if applicable) Unclamped 03/10/21 0800  Output (mL) 225 mL 03/10/21 0849    Microbiology/Sepsis markers: Results for orders placed or performed during the hospital encounter of 02/15/21  Resp Panel by RT-PCR (Flu A&B, Covid) Nasopharyngeal Swab     Status: None   Collection Time: 02/15/21  8:14 PM   Specimen: Nasopharyngeal Swab; Nasopharyngeal(NP) swabs in vial transport medium  Result Value Ref Range Status   SARS Coronavirus 2 by RT PCR NEGATIVE NEGATIVE  Final    Comment: (NOTE) SARS-CoV-2 target nucleic acids are NOT DETECTED.  The  SARS-CoV-2 RNA is generally detectable in upper respiratory specimens during the acute phase of infection. The lowest concentration of SARS-CoV-2 viral copies this assay can detect is 138 copies/mL. A negative result does not preclude SARS-Cov-2 infection and should not be used as the sole basis for treatment or other patient management decisions. A negative result may occur with  improper specimen collection/handling, submission of specimen other than nasopharyngeal swab, presence of viral mutation(s) within the areas targeted by this assay, and inadequate number of viral copies(<138 copies/mL). A negative result must be combined with clinical observations, patient history, and epidemiological information. The expected result is Negative.  Fact Sheet for Patients:  BloggerCourse.com  Fact Sheet for Healthcare Providers:  SeriousBroker.it  This test is no t yet approved or cleared by the [place] FDA and  has been authorized for detection and/or diagnosis of SARS-CoV-2 by FDA under an Emergency Use Authorization (EUA). This EUA will remain  in effect (meaning this test can be used) for the duration of the COVID-19 declaration under Section 564(b)(1) of the Act, 21 U.S.C.section 360bbb-3(b)(1), unless the authorization is terminated  or revoked sooner.       Influenza A by PCR NEGATIVE NEGATIVE Final   Influenza B by PCR NEGATIVE NEGATIVE Final    Comment: (NOTE) The Xpert Xpress SARS-CoV-2/FLU/RSV plus assay is intended as an aid in the diagnosis of influenza from Nasopharyngeal swab specimens and should not be used as a sole basis for treatment. Nasal washings and aspirates are unacceptable for Xpert Xpress SARS-CoV-2/FLU/RSV testing.  Fact Sheet for Patients: BloggerCourse.com  Fact Sheet for Healthcare Providers: SeriousBroker.it  This test is not yet approved or  cleared by the [place] FDA and has been authorized for detection and/or diagnosis of SARS-CoV-2 by FDA under an Emergency Use Authorization (EUA). This EUA will remain in effect (meaning this test can be used) for the duration of the COVID-19 declaration under Section 564(b)(1) of the Act, 21 U.S.C. section 360bbb-3(b)(1), unless the authorization is terminated or revoked.  Performed at [facility], [address] [place], [place] [address]   MRSA Next Gen by PCR, Nasal     Status: None   Collection Time: 02/15/21  9:58 PM   Specimen: Nasal Mucosa; Nasal Swab  Result Value Ref Range Status   MRSA by PCR Next Gen NOT DETECTED NOT DETECTED Final    Comment: (NOTE) The GeneXpert MRSA Assay (FDA approved for NASAL specimens only), is one component of a comprehensive MRSA colonization surveillance program. It is not intended to diagnose MRSA infection nor to guide or monitor treatment for MRSA infections. Test performance is not FDA approved in patients less than 48 years old. Performed at [facility], [address] [place], [place] [address]   Culture, Respiratory w Gram Stain     Status: None   Collection Time: 02/17/21  2:04 PM   Specimen: Tracheal Aspirate; Respiratory  Result Value Ref Range Status   Specimen Description TRACHEAL ASPIRATE  Final   Special Requests NONE  Final   Gram Stain   Final    ABUNDANT WBC PRESENT,BOTH PMN AND MONONUCLEAR ABUNDANT GRAM NEGATIVE RODS MODERATE GRAM POSITIVE COCCI RARE GRAM POSITIVE RODS    Culture   Final    FEW Normal respiratory flora-no Staph aureus or Pseudomonas seen Performed at [facility], [address] [place], [place] [address]  Report Status 02/19/2021 FINAL  Final  Culture, blood (routine x 2)     Status: None   Collection Time: 02/17/21  2:24 PM   Specimen: BLOOD  Result Value Ref Range Status   Specimen Description BLOOD BLOOD RIGHT FOREARM  Final   Special Requests   Final    BOTTLES  DRAWN AEROBIC AND ANAEROBIC Blood Culture adequate volume   Culture   Final    NO GROWTH 5 DAYS Performed at [facility], [address] [place], [place] [address]    Report Status 02/22/2021 FINAL  Final  Culture, blood (routine x 2)     Status: None   Collection Time: 02/17/21  3:01 PM   Specimen: BLOOD  Result Value Ref Range Status   Specimen Description BLOOD LEFT ANTECUBITAL  Final   Special Requests   Final    BOTTLES DRAWN AEROBIC AND ANAEROBIC Blood Culture adequate volume   Culture   Final    NO GROWTH 5 DAYS Performed at [facility], [address], [place], [place] [address]    Report Status 02/22/2021 FINAL  Final  Culture, Respiratory w Gram Stain     Status: None   Collection Time: 02/20/21 12:49 PM   Specimen: Tracheal Aspirate; Respiratory  Result Value Ref Range Status   Specimen Description TRACHEAL ASPIRATE  Final   Special Requests NONE  Final   Gram Stain   Final    MODERATE WBC PRESENT,BOTH PMN AND MONONUCLEAR ABUNDANT GRAM POSITIVE COCCI IN PAIRS ABUNDANT GRAM NEGATIVE RODS Performed at [facility], [address], [place], [place] [address]    Culture   Final    ABUNDANT METHICILLIN RESISTANT STAPHYLOCOCCUS AUREUS ABUNDANT PSEUDOMONAS AERUGINOSA    Report Status 02/23/2021 FINAL  Final   Organism ID, Bacteria METHICILLIN RESISTANT STAPHYLOCOCCUS AUREUS  Final   Organism ID, Bacteria PSEUDOMONAS AERUGINOSA  Final      Susceptibility   Methicillin resistant staphylococcus aureus - MIC*    CIPROFLOXACIN <=0.5 SENSITIVE Sensitive     ERYTHROMYCIN <=0.25 SENSITIVE Sensitive     GENTAMICIN <=0.5 SENSITIVE Sensitive     OXACILLIN >=4 RESISTANT Resistant     TETRACYCLINE <=1 SENSITIVE Sensitive     VANCOMYCIN 1 SENSITIVE Sensitive     TRIMETH/SULFA <=10 SENSITIVE Sensitive     CLINDAMYCIN <=0.25 SENSITIVE Sensitive     RIFAMPIN <=0.5 SENSITIVE Sensitive     Inducible Clindamycin NEGATIVE Sensitive     * ABUNDANT METHICILLIN  RESISTANT STAPHYLOCOCCUS AUREUS   Pseudomonas aeruginosa - MIC*    CEFTAZIDIME 4 SENSITIVE Sensitive     CIPROFLOXACIN 0.5 SENSITIVE Sensitive     GENTAMICIN <=1 SENSITIVE Sensitive     IMIPENEM 2 SENSITIVE Sensitive     PIP/TAZO 8 SENSITIVE Sensitive     CEFEPIME 2 SENSITIVE Sensitive     * ABUNDANT PSEUDOMONAS AERUGINOSA  Monkeypox Virus DNA, Qualitative Real-Time PCR     Status: None   Collection Time: 02/23/21  1:42 AM   Specimen: Arm, Right; Sterile Swab  Result Value Ref Range Status   Orthopoxvirus DNA, QL PCR NOT DETECTED NOT DETECTED Final    Comment: (NOTE) Non-variola Orthopoxvirus DNA not detected by real-time PCR. Performed At: [facility] [address] [place], [place] [id number] Jolene Schimke MD OV:[id number]   Culture, Respiratory w Gram Stain     Status: None   Collection Time: 02/23/21 10:05 AM   Specimen: Tracheal Aspirate; Respiratory  Result Value Ref Range Status   Specimen Description  TRACHEAL ASPIRATE  Final   Special Requests NONE  Final   Gram Stain   Final    ABUNDANT WBC PRESENT, PREDOMINANTLY PMN FEW GRAM NEGATIVE RODS RARE GRAM POSITIVE COCCI Performed at [facility], [address] [place], [place] [address]    Culture   Final    MODERATE PSEUDOMONAS AERUGINOSA RARE METHICILLIN RESISTANT STAPHYLOCOCCUS AUREUS    Report Status 02/26/2021 FINAL  Final   Organism ID, Bacteria PSEUDOMONAS AERUGINOSA  Final   Organism ID, Bacteria METHICILLIN RESISTANT STAPHYLOCOCCUS AUREUS  Final      Susceptibility   Methicillin resistant staphylococcus aureus - MIC*    CIPROFLOXACIN <=0.5 SENSITIVE Sensitive     ERYTHROMYCIN <=0.25 SENSITIVE Sensitive     GENTAMICIN <=0.5 SENSITIVE Sensitive     OXACILLIN >=4 RESISTANT Resistant     TETRACYCLINE <=1 SENSITIVE Sensitive     VANCOMYCIN <=0.5 SENSITIVE Sensitive     TRIMETH/SULFA <=10 SENSITIVE Sensitive     CLINDAMYCIN <=0.25 SENSITIVE Sensitive     RIFAMPIN <=0.5 SENSITIVE Sensitive      Inducible Clindamycin NEGATIVE Sensitive     * RARE METHICILLIN RESISTANT STAPHYLOCOCCUS AUREUS   Pseudomonas aeruginosa - MIC*    CEFTAZIDIME 4 SENSITIVE Sensitive     CIPROFLOXACIN <=0.25 SENSITIVE Sensitive     GENTAMICIN <=1 SENSITIVE Sensitive     IMIPENEM 1 SENSITIVE Sensitive     PIP/TAZO 8 SENSITIVE Sensitive     CEFEPIME 2 SENSITIVE Sensitive     * MODERATE PSEUDOMONAS AERUGINOSA  Culture, blood (Routine X 2) w Reflex to ID Panel     Status: None   Collection Time: 02/24/21 10:53 AM   Specimen: BLOOD RIGHT HAND  Result Value Ref Range Status   Specimen Description BLOOD RIGHT HAND  Final   Special Requests   Final    BOTTLES DRAWN AEROBIC AND ANAEROBIC Blood Culture adequate volume   Culture   Final    NO GROWTH 5 DAYS Performed at [facility], [address], [place], [place] [address]    Report Status 03/01/2021 FINAL  Final  Culture, blood (Routine X 2) w Reflex to ID Panel     Status: None   Collection Time: 02/24/21 10:58 AM   Specimen: BLOOD LEFT HAND  Result Value Ref Range Status   Specimen Description BLOOD LEFT HAND  Final   Special Requests   Final    BOTTLES DRAWN AEROBIC AND ANAEROBIC Blood Culture adequate volume   Culture   Final    NO GROWTH 5 DAYS Performed at [facility], [address] [place], [place] [address]    Report Status 03/01/2021 FINAL  Final  SARS CORONAVIRUS 2 (TAT 6-24 HRS) Nasopharyngeal Nasopharyngeal Swab     Status: None   Collection Time: 02/25/21  9:20 AM   Specimen: Nasopharyngeal Swab  Result Value Ref Range Status   SARS Coronavirus 2 NEGATIVE NEGATIVE Final    Comment: (NOTE) SARS-CoV-2 target nucleic acids are NOT DETECTED.  The SARS-CoV-2 RNA is generally detectable in upper and lower respiratory specimens during the acute phase of infection. Negative results do not preclude SARS-CoV-2 infection, do not rule out co-infections with other pathogens, and should not be used as the sole basis for treatment or  other patient management decisions. Negative results must be combined with clinical observations, patient history, and epidemiological information. The expected result is Negative.  Fact Sheet for Patients: HairSlick.no  Fact Sheet for Healthcare Providers: quierodirigir.com  This test is not yet  approved or cleared by the [place] and  has been authorized for detection and/or diagnosis of SARS-CoV-2 by FDA under an Emergency Use Authorization (EUA). This EUA will remain  in effect (meaning this test can be used) for the duration of the COVID-19 declaration under Se ction 564(b)(1) of the Act, 21 U.S.C. section 360bbb-3(b)(1), unless the authorization is terminated or revoked sooner.  Performed at [facility], [address] [place], [place] [address]   Culture, Respiratory w Gram Stain     Status: None   Collection Time: 03/04/21 10:37 AM   Specimen: Tracheal Aspirate; Respiratory  Result Value Ref Range Status   Specimen Description TRACHEAL ASPIRATE  Final   Special Requests NONE  Final   Gram Stain   Final    FEW SQUAMOUS EPITHELIAL CELLS PRESENT MODERATE WBC PRESENT, PREDOMINANTLY PMN MODERATE GRAM NEGATIVE RODS    Culture   Final    FEW PSEUDOMONAS AERUGINOSA NO STAPHYLOCOCCUS AUREUS ISOLATED Performed at [facility], [address], [place], [place] [address]    Report Status 03/07/2021 FINAL  Final   Organism ID, Bacteria PSEUDOMONAS AERUGINOSA  Final      Susceptibility   Pseudomonas aeruginosa - MIC*    CEFTAZIDIME 16 INTERMEDIATE Intermediate     CIPROFLOXACIN <=0.25 SENSITIVE Sensitive     GENTAMICIN <=1 SENSITIVE Sensitive     IMIPENEM 2 SENSITIVE Sensitive     * FEW PSEUDOMONAS AERUGINOSA  Culture, Respiratory w Gram Stain     Status: None   Collection Time: 03/06/21  1:56 PM   Specimen: Tracheal Aspirate; Respiratory  Result Value Ref Range Status   Specimen Description  TRACHEAL ASPIRATE  Final   Special Requests NONE  Final   Gram Stain   Final    MODERATE SQUAMOUS EPITHELIAL CELLS PRESENT MODERATE WBC PRESENT, PREDOMINANTLY PMN FEW GRAM NEGATIVE RODS FEW GRAM POSITIVE COCCI Performed at [facility], [address], [place], [place] [address]    Culture   Final    ABUNDANT PSEUDOMONAS AERUGINOSA ABUNDANT METHICILLIN RESISTANT STAPHYLOCOCCUS AUREUS    Report Status 03/09/2021 FINAL  Final   Organism ID, Bacteria PSEUDOMONAS AERUGINOSA  Final   Organism ID, Bacteria METHICILLIN RESISTANT STAPHYLOCOCCUS AUREUS  Final      Susceptibility   Methicillin resistant staphylococcus aureus - MIC*    CIPROFLOXACIN <=0.5 SENSITIVE Sensitive     ERYTHROMYCIN <=0.25 SENSITIVE Sensitive     GENTAMICIN <=0.5 SENSITIVE Sensitive     OXACILLIN >=4 RESISTANT Resistant     TETRACYCLINE <=1 SENSITIVE Sensitive     VANCOMYCIN <=0.5 SENSITIVE Sensitive     TRIMETH/SULFA <=10 SENSITIVE Sensitive     CLINDAMYCIN <=0.25 SENSITIVE Sensitive     RIFAMPIN <=0.5 SENSITIVE Sensitive     Inducible Clindamycin NEGATIVE Sensitive     * ABUNDANT METHICILLIN RESISTANT STAPHYLOCOCCUS AUREUS   Pseudomonas aeruginosa - MIC*    CEFTAZIDIME 2 SENSITIVE Sensitive     CIPROFLOXACIN <=0.25 SENSITIVE Sensitive     GENTAMICIN <=1 SENSITIVE Sensitive     IMIPENEM 0.5 SENSITIVE Sensitive     PIP/TAZO <=4 SENSITIVE Sensitive     CEFEPIME 2 SENSITIVE Sensitive     * ABUNDANT PSEUDOMONAS AERUGINOSA  Aerobic Culture w Gram Stain (superficial specimen)     Status: None   Collection Time: 03/06/21  3:13 PM   Specimen: Wound  Result Value Ref Range Status   Specimen Description WOUND  Final   Special Requests EAR RIGHT  Final   Gram  Stain   Final    ABUNDANT WBC PRESENT,BOTH PMN AND MONONUCLEAR RARE SQUAMOUS EPITHELIAL CELLS PRESENT ABUNDANT GRAM NEGATIVE RODS FEW GRAM VARIABLE ROD FEW GRAM POSITIVE COCCI FEW YEAST Performed at [facility], [address]  [place], [place] [address]    Culture   Final    MODERATE ENTEROBACTER CLOACAE FEW KLEBSIELLA PNEUMONIAE    Report Status 03/09/2021 FINAL  Final   Organism ID, Bacteria ENTEROBACTER CLOACAE  Final   Organism ID, Bacteria KLEBSIELLA PNEUMONIAE  Final      Susceptibility   Enterobacter cloacae - MIC*    CEFAZOLIN >=64 RESISTANT Resistant     CEFEPIME <=0.12 SENSITIVE Sensitive     CEFTAZIDIME <=1 SENSITIVE Sensitive     CIPROFLOXACIN <=0.25 SENSITIVE Sensitive     GENTAMICIN <=1 SENSITIVE Sensitive     IMIPENEM <=0.25 SENSITIVE Sensitive     TRIMETH/SULFA <=20 SENSITIVE Sensitive     PIP/TAZO <=4 SENSITIVE Sensitive     * MODERATE ENTEROBACTER CLOACAE   Klebsiella pneumoniae - MIC*    AMPICILLIN >=32 RESISTANT Resistant     CEFAZOLIN <=4 SENSITIVE Sensitive     CEFEPIME <=0.12 SENSITIVE Sensitive     CEFTAZIDIME <=1 SENSITIVE Sensitive     CEFTRIAXONE <=0.25 SENSITIVE Sensitive     CIPROFLOXACIN <=0.25 SENSITIVE Sensitive     GENTAMICIN <=1 SENSITIVE Sensitive     IMIPENEM <=0.25 SENSITIVE Sensitive     TRIMETH/SULFA <=20 SENSITIVE Sensitive     AMPICILLIN/SULBACTAM 4 SENSITIVE Sensitive     PIP/TAZO <=4 SENSITIVE Sensitive     * FEW KLEBSIELLA PNEUMONIAE  Culture, Respiratory w Gram Stain     Status: None (Preliminary result)   Collection Time: 03/09/21 10:46 AM   Specimen: Tracheal Aspirate; Respiratory  Result Value Ref Range Status   Specimen Description TRACHEAL ASPIRATE  Final   Special Requests NONE  Final   Gram Stain   Final    FEW WBC PRESENT, PREDOMINANTLY PMN FEW GRAM NEGATIVE RODS Performed at [facility], [address] [place], [place] [address]    Culture PENDING  Incomplete   Report Status PENDING  Incomplete    Anti-infectives:  Anti-infectives (From admission, onward)    Start     Dose/Rate Route Frequency Ordered Stop   03/10/21 1400  piperacillin-tazobactam (ZOSYN) IVPB 3.375 g        3.375 g 12.5 mL/hr over 240 Minutes Intravenous Every 8  hours 03/10/21 0909     03/10/21 1000  vancomycin (VANCOCIN) IVPB 1000 mg/200 mL premix        1,000 mg 200 mL/hr over 60 Minutes Intravenous Every 8 hours 03/10/21 0908     03/06/21 1415  piperacillin-tazobactam (ZOSYN) IVPB 3.375 g        3.375 g 12.5 mL/hr over 240 Minutes Intravenous Every 8 hours 03/06/21 1324 03/10/21 0902   02/24/21 1200  vancomycin (VANCOCIN) IVPB 1000 mg/200 mL premix        1,000 mg 200 mL/hr over 60 Minutes Intravenous Every 8 hours 02/24/21 0934 03/02/21 2020   02/23/21 0200  vancomycin (VANCOREADY) IVPB 1500 mg/300 mL  Status:  Discontinued        1,500 mg 150 mL/hr over 120 Minutes Intravenous Every 8 hours 02/22/21 1931 02/24/21 0934   02/22/21 2100  vancomycin (VANCOCIN) 250 mg in sodium chloride 0.9 % 100 mL IVPB        250 mg 100 mL/hr over 60 Minutes Intravenous  Once 02/22/21 2012 02/22/21 2150   02/22/21  2030  vancomycin (VANCOCIN) 250 mg in sodium chloride 0.9 % 500 mL IVPB  Status:  Discontinued        250 mg 250 mL/hr over 120 Minutes Intravenous  Once 02/22/21 1931 02/22/21 2012   02/21/21 1800  vancomycin (VANCOREADY) IVPB 1250 mg/250 mL  Status:  Discontinued        1,250 mg 166.7 mL/hr over 90 Minutes Intravenous Every 8 hours 02/21/21 0851 02/22/21 1931   02/21/21 1000  ceFEPIme (MAXIPIME) 2 g in sodium chloride 0.9 % 100 mL IVPB        2 g 200 mL/hr over 30 Minutes Intravenous Every 8 hours 02/21/21 0851 03/02/21 1749   02/21/21 1000  vancomycin (VANCOREADY) IVPB 2000 mg/400 mL        2,000 mg 200 mL/hr over 120 Minutes Intravenous  Once 02/21/21 0851 02/21/21 1306       Best Practice/Protocols:  VTE Prophylaxis: Lovenox (full dose) Continous Sedation  Consults: Treatment Team:  Serena Colonel, MD Lisbeth Renshaw, MD Myrene Galas, MD    Studies:    Events:  Subjective:    Overnight Issues:   Objective:  Vital signs for last 24 hours: Temp:  [98 F (36.7 C)-102.7 F (39.3 C)] 98 F (36.7 C) (09/01 0759) Pulse  Rate:  [100-140] 107 (09/01 0800) Resp:  [19-37] 22 (09/01 0800) BP: (94-154)/(60-92) 148/71 (09/01 0813) SpO2:  [9 %-98 %] 9 % (09/01 0813) Arterial Line BP: (95-151)/(48-85) 128/65 (09/01 0800) FiO2 (%):  [40 %-60 %] 40 % (09/01 0813) Weight:  [62 kg] 62 kg (09/01 0437)  Hemodynamic parameters for last 24 hours:    Intake/Output from previous day: 08/31 0701 - 09/01 0700 In: 2422.6 [I.V.:863.1; NG/GT:1403.3; IV Piggyback:156.2] Out: 3500 [Urine:3100; Stool:400]  Intake/Output this shift: Total I/O In: 51.8 [I.V.:39.2; IV Piggyback:12.5] Out: 225 [Urine:225]  Vent settings for last 24 hours: Vent Mode: PRVC FiO2 (%):  [40 %-60 %] 40 % Set Rate:  [22 bmp] 22 bmp Vt Set:  [620 mL] 620 mL PEEP:  [8 cmH20] 8 cmH20 Plateau Pressure:  [16 cmH20-22 cmH20] 16 cmH20  Physical Exam:  General: on vent Neuro: moved face to stim, not F/C HEENT/Neck: ETT Resp: coarse B CVS: RRR GI: soft, NT Extremities: no edema  Results for orders placed or performed during the hospital encounter of 02/15/21 (from the past 24 hour(s))  Culture, Respiratory w Gram Stain     Status: None (Preliminary result)   Collection Time: 03/09/21 10:46 AM   Specimen: Tracheal Aspirate; Respiratory  Result Value Ref Range   Specimen Description TRACHEAL ASPIRATE    Special Requests NONE    Gram Stain      FEW WBC PRESENT, PREDOMINANTLY PMN FEW GRAM NEGATIVE RODS Performed at [facility], [address] [place], [place] [address]    Culture PENDING    Report Status PENDING   Glucose, capillary     Status: Abnormal   Collection Time: 03/09/21 12:51 PM  Result Value Ref Range   Glucose-Capillary 140 (H) 70 - 99 mg/dL  Urinalysis, Routine w reflex microscopic     Status: Abnormal   Collection Time: 03/09/21  2:54 PM  Result Value Ref Range   Color, Urine YELLOW YELLOW   APPearance HAZY (A) CLEAR   Specific Gravity, Urine 1.025 1.005 - 1.030   pH 5.0 5.0 - 8.0   Glucose, UA NEGATIVE NEGATIVE  mg/dL   Hgb urine dipstick SMALL (A) NEGATIVE   Bilirubin Urine NEGATIVE NEGATIVE   Ketones,  ur NEGATIVE NEGATIVE mg/dL   Protein, ur NEGATIVE NEGATIVE mg/dL   Nitrite NEGATIVE NEGATIVE   Leukocytes,Ua NEGATIVE NEGATIVE   RBC / HPF >50 (H) 0 - 5 RBC/hpf   WBC, UA 6-10 0 - 5 WBC/hpf   Bacteria, UA RARE (A) NONE SEEN   Mucus PRESENT    Ca Oxalate Crys, UA PRESENT   Glucose, capillary     Status: Abnormal   Collection Time: 03/09/21  5:55 PM  Result Value Ref Range   Glucose-Capillary 139 (H) 70 - 99 mg/dL  Glucose, capillary     Status: Abnormal   Collection Time: 03/09/21  7:30 PM  Result Value Ref Range   Glucose-Capillary 127 (H) 70 - 99 mg/dL  Glucose, capillary     Status: Abnormal   Collection Time: 03/09/21 11:22 PM  Result Value Ref Range   Glucose-Capillary 122 (H) 70 - 99 mg/dL  Glucose, capillary     Status: Abnormal   Collection Time: 03/10/21  3:25 AM  Result Value Ref Range   Glucose-Capillary 122 (H) 70 - 99 mg/dL  I-STAT 7, (LYTES, BLD GAS, ICA, H+H)     Status: Abnormal   Collection Time: 03/10/21  4:33 AM  Result Value Ref Range   pH, Arterial 7.386 7.350 - 7.450   pCO2 arterial 39.1 32.0 - 48.0 mmHg   pO2, Arterial 76 (L) 83.0 - 108.0 mmHg   Bicarbonate 23.4 20.0 - 28.0 mmol/L   TCO2 25 22 - 32 mmol/L   O2 Saturation 95.0 %   Acid-base deficit 1.0 0.0 - 2.0 mmol/L   Sodium 158 (H) 135 - 145 mmol/L   Potassium 3.4 (L) 3.5 - 5.1 mmol/L   Calcium, Ion 1.32 1.15 - 1.40 mmol/L   HCT 29.0 (L) 39.0 - 52.0 %   Hemoglobin 9.9 (L) 13.0 - 17.0 g/dL   Patient temperature 42.6 F    Collection site art line    Drawn by RT

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
## 2021-03-11 ENCOUNTER — Inpatient Hospital Stay (HOSPITAL_COMMUNITY): Payer: 59 | Admitting: Anesthesiology

## 2021-03-11 ENCOUNTER — Inpatient Hospital Stay (HOSPITAL_COMMUNITY): Payer: 59

## 2021-03-11 ENCOUNTER — Encounter (HOSPITAL_COMMUNITY): Admission: EM | Disposition: A | Discharge status: Rehabilitation Facility | Payer: Self-pay | Source: Home / Self Care

## 2021-03-11 HISTORY — PX: PEG PLACEMENT: SHX5437

## 2021-03-11 HISTORY — PX: TRACHEOSTOMY TUBE PLACEMENT: SHX814

## 2021-03-11 LAB — GLUCOSE, CAPILLARY
Glucose-Capillary: 111 mg/dL — ABNORMAL HIGH (ref 70–99)
Glucose-Capillary: 116 mg/dL — ABNORMAL HIGH (ref 70–99)
Glucose-Capillary: 120 mg/dL — ABNORMAL HIGH (ref 70–99)
Glucose-Capillary: 135 mg/dL — ABNORMAL HIGH (ref 70–99)
Glucose-Capillary: 137 mg/dL — ABNORMAL HIGH (ref 70–99)
Glucose-Capillary: 182 mg/dL — ABNORMAL HIGH (ref 70–99)

## 2021-03-11 LAB — BASIC METABOLIC PANEL
Anion gap: 12 (ref 5–15)
BUN: 40 mg/dL — ABNORMAL HIGH (ref 6–20)
CO2: 24 mmol/L (ref 22–32)
Calcium: 9.3 mg/dL (ref 8.9–10.3)
Chloride: 116 mmol/L — ABNORMAL HIGH (ref 98–111)
Creatinine, Ser: 0.54 mg/dL — ABNORMAL LOW (ref 0.61–1.24)
GFR, Estimated: >60 mL/min (ref >60)
Glucose, Bld: 110 mg/dL — ABNORMAL HIGH (ref 70–99)
Potassium: 3.6 mmol/L (ref 3.5–5.1)
Sodium: 152 mmol/L — ABNORMAL HIGH (ref 135–145)

## 2021-03-11 LAB — CBC WITH DIFFERENTIAL/PLATELET
Abs Immature Granulocytes: 0.27 10*3/uL — ABNORMAL HIGH (ref 0.00–0.07)
Basophils Absolute: 0.1 10*3/uL (ref 0.0–0.1)
Basophils Relative: 1 %
Eosinophils Absolute: 0.1 10*3/uL (ref 0.0–0.5)
Eosinophils Relative: 1 %
HCT: 29.6 % — ABNORMAL LOW (ref 39.0–52.0)
Hemoglobin: 8.9 g/dL — ABNORMAL LOW (ref 13.0–17.0)
Immature Granulocytes: 1 %
Lymphocytes Relative: 10 %
Lymphs Abs: 2.1 10*3/uL (ref 0.7–4.0)
MCH: 30.4 pg (ref 26.0–34.0)
MCHC: 30.1 g/dL (ref 30.0–36.0)
MCV: 101 fL — ABNORMAL HIGH (ref 80.0–100.0)
Monocytes Absolute: 1.4 10*3/uL — ABNORMAL HIGH (ref 0.1–1.0)
Monocytes Relative: 7 %
Neutro Abs: 16.6 10*3/uL — ABNORMAL HIGH (ref 1.7–7.7)
Neutrophils Relative %: 80 %
Platelets: 403 10*3/uL — ABNORMAL HIGH (ref 150–400)
RBC: 2.93 MIL/uL — ABNORMAL LOW (ref 4.22–5.81)
RDW: 17.1 % — ABNORMAL HIGH (ref 11.5–15.5)
WBC: 20.6 10*3/uL — ABNORMAL HIGH (ref 4.0–10.5)
nRBC: 0 % (ref 0.0–0.2)

## 2021-03-11 LAB — VANCOMYCIN, PEAK: Vancomycin Pk: 36 ug/mL (ref 30–40)

## 2021-03-11 LAB — SURGICAL PCR SCREEN
MRSA, PCR: POSITIVE — AB
Staphylococcus aureus: POSITIVE — AB

## 2021-03-11 LAB — VANCOMYCIN, TROUGH: Vancomycin Tr: 10 ug/mL — ABNORMAL LOW (ref 15–20)

## 2021-03-11 SURGERY — CREATION, TRACHEOSTOMY
Anesthesia: General

## 2021-03-11 MED ORDER — PHENYLEPHRINE 40 MCG/ML (10ML) SYRINGE FOR IV PUSH (FOR BLOOD PRESSURE SUPPORT)
PREFILLED_SYRINGE | INTRAVENOUS | Status: DC | PRN
  Administered 2021-03-11: 160 ug via INTRAVENOUS
  Administered 2021-03-11: 120 ug via INTRAVENOUS

## 2021-03-11 MED ORDER — METOPROLOL TARTRATE 50 MG PO TABS
150.0000 mg | ORAL_TABLET | Freq: Three times a day (TID) | ORAL | Status: DC
  Administered 2021-03-11 – 2021-04-01 (×54): 150 mg
  Filled 2021-03-11 (×52): qty 3
  Filled 2021-03-11: qty 6
  Filled 2021-03-11 (×4): qty 3

## 2021-03-11 MED ORDER — FENTANYL CITRATE (PF) 250 MCG/5ML IJ SOLN
INTRAMUSCULAR | Status: AC
  Filled 2021-03-11: qty 5

## 2021-03-11 MED ORDER — FENTANYL CITRATE (PF) 250 MCG/5ML IJ SOLN
INTRAMUSCULAR | Status: DC | PRN
  Administered 2021-03-11: 50 ug via INTRAVENOUS

## 2021-03-11 MED ORDER — LIDOCAINE-EPINEPHRINE 1.5 %-1:200,000 OPTIME - NO CHARGE
INTRAMUSCULAR | Status: DC | PRN
  Administered 2021-03-11: 2 mL via SUBCUTANEOUS

## 2021-03-11 MED ORDER — MUPIROCIN 2 % EX OINT
1.0000 "application " | TOPICAL_OINTMENT | Freq: Two times a day (BID) | CUTANEOUS | Status: AC
  Administered 2021-03-11 – 2021-03-15 (×10): 1 via NASAL
  Filled 2021-03-11 (×2): qty 22

## 2021-03-11 MED ORDER — 0.9 % SODIUM CHLORIDE (POUR BTL) OPTIME
TOPICAL | Status: DC | PRN
  Administered 2021-03-11: 1000 mL

## 2021-03-11 MED ORDER — LACTATED RINGERS IV SOLN: INTRAVENOUS | Status: DC | PRN

## 2021-03-11 MED ORDER — ROCURONIUM BROMIDE 10 MG/ML (PF) SYRINGE
PREFILLED_SYRINGE | INTRAVENOUS | Status: AC
  Filled 2021-03-11: qty 10

## 2021-03-11 MED ORDER — PROPOFOL 10 MG/ML IV BOLUS
INTRAVENOUS | Status: AC
  Filled 2021-03-11: qty 40

## 2021-03-11 MED ORDER — PIVOT 1.5 CAL PO LIQD
1000.0000 mL | ORAL | Status: DC
  Administered 2021-03-11 – 2021-03-23 (×12): 1000 mL
  Filled 2021-03-11: qty 1000

## 2021-03-11 MED ORDER — VANCOMYCIN HCL 750 MG/150ML IV SOLN
750.0000 mg | Freq: Three times a day (TID) | INTRAVENOUS | Status: DC
  Administered 2021-03-12 (×2): 750 mg via INTRAVENOUS
  Filled 2021-03-11 (×4): qty 150

## 2021-03-11 MED ORDER — MIDAZOLAM HCL 2 MG/2ML IJ SOLN
INTRAMUSCULAR | Status: DC | PRN
  Administered 2021-03-11: 2 mg via INTRAVENOUS

## 2021-03-11 MED ORDER — MIDAZOLAM HCL 2 MG/2ML IJ SOLN
INTRAMUSCULAR | Status: AC
  Filled 2021-03-11: qty 2

## 2021-03-11 MED ORDER — ROCURONIUM BROMIDE 10 MG/ML (PF) SYRINGE
PREFILLED_SYRINGE | INTRAVENOUS | Status: DC | PRN
  Administered 2021-03-11: 100 mg via INTRAVENOUS

## 2021-03-11 SURGICAL SUPPLY — 37 items
BAG COUNTER SPONGE SURGICOUNT (BAG) ×2 IMPLANT
BAG SPNG CNTER NS LX DISP (BAG) ×1
BINDER ABDOMINAL 12 ML 46-62 (SOFTGOODS) ×1 IMPLANT
BLADE CLIPPER SURG (BLADE) ×1 IMPLANT
CANISTER SUCT 3000ML PPV (MISCELLANEOUS) ×3 IMPLANT
COVER SURGICAL LIGHT HANDLE (MISCELLANEOUS) ×2 IMPLANT
DRAPE UTILITY XL STRL (DRAPES) ×2 IMPLANT
DRSG TEGADERM 4X4.75 (GAUZE/BANDAGES/DRESSINGS) ×2 IMPLANT
DRSG TELFA 3X8 NADH (GAUZE/BANDAGES/DRESSINGS) ×2 IMPLANT
ELECT CAUTERY BLADE 6.4 (BLADE) ×2 IMPLANT
ELECT REM PT RETURN 9FT ADLT (ELECTROSURGICAL) ×2
ELECTRODE REM PT RTRN 9FT ADLT (ELECTROSURGICAL) ×1 IMPLANT
GAUZE 4X4 16PLY ~~LOC~~+RFID DBL (SPONGE) ×2 IMPLANT
GLOVE SURG ENC MOIS LTX SZ6.5 (GLOVE) ×2 IMPLANT
GLOVE SURG UNDER POLY LF SZ6 (GLOVE) ×2 IMPLANT
GOWN STRL REUS W/ TWL LRG LVL3 (GOWN DISPOSABLE) ×2 IMPLANT
GOWN STRL REUS W/TWL LRG LVL3 (GOWN DISPOSABLE) ×4
INTRODUCER TRACH BLUE RHINO 6F (TUBING) ×1 IMPLANT
KIT BASIN OR (CUSTOM PROCEDURE TRAY) ×2 IMPLANT
KIT TURNOVER KIT B (KITS) ×2 IMPLANT
NS IRRIG 1000ML POUR BTL (IV SOLUTION) ×2 IMPLANT
PACK EENT II TURBAN DRAPE (CUSTOM PROCEDURE TRAY) ×2 IMPLANT
PAD DRESSING TELFA 3X8 NADH (GAUZE/BANDAGES/DRESSINGS) ×1 IMPLANT
PENCIL BUTTON HOLSTER BLD 10FT (ELECTRODE) ×2 IMPLANT
SPONGE DRAIN TRACH 4X4 STRL 2S (GAUZE/BANDAGES/DRESSINGS) ×1 IMPLANT
SPONGE INTESTINAL PEANUT (DISPOSABLE) ×2 IMPLANT
SUT SILK 2 0 SH (SUTURE) IMPLANT
SUT VIC AB 2-0 SH 27 (SUTURE)
SUT VIC AB 2-0 SH 27XBRD (SUTURE) IMPLANT
SUT VICRYL AB 3 0 TIES (SUTURE) ×2 IMPLANT
SYR 20ML LL LF (SYRINGE) ×2 IMPLANT
TOWEL GREEN STERILE (TOWEL DISPOSABLE) ×2 IMPLANT
TOWEL GREEN STERILE FF (TOWEL DISPOSABLE) ×2 IMPLANT
TUBE CONNECTING 12X1/4 (SUCTIONS) ×3 IMPLANT
TUBE ENDOVIVE SAFETY PEG 22FR (TUBING) ×1 IMPLANT
TUBE TRACH  6.0 CUFF FLEX (MISCELLANEOUS) ×4
TUBE TRACH 6.0 CUFF FLEX (MISCELLANEOUS) IMPLANT
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
## 2021-03-11 NOTE — TOC Progression Note (Signed)
Transition of Care [facility]) - Progression Note    Patient Details  Name: Scott [name] MRN: [id number] Date of Birth: 08-26-1985  Transition of Care [facility]) CM/SW Contact  Lockie Pares, RN Phone Number: 03/11/2021, 10:47 AM  Clinical Narrative:    Tracheostomy and PEG done. Positive for MRSA and Staph a on swab . Pateint minimally responsive. Inquired with LTAC regarding insurance requirements,they do not  take patients payor source, per select will inquire with other LTAC.  CM will continue to follow   Expected Discharge Plan: Skilled Nursing Facility Barriers to Discharge: Continued Medical Work up  Expected Discharge Plan and Services Expected Discharge Plan: Skilled Nursing Facility   Discharge Planning Services: CM Consult   Living arrangements for the past 2 months: Single Family Home                                       Social Determinants of Health (SDOH) Interventions    Readmission Risk Interventions No flowsheet data found.

## 2021-03-11 NOTE — Anesthesia Procedure Notes (Signed)
Date/Time: 03/11/2021 7:48 AM Performed by: Jodell Cipro, CRNA Pre-anesthesia Checklist: Patient identified, Emergency Drugs available, Suction available, Patient being monitored and Timeout performed Patient Re-evaluated:Patient Re-evaluated prior to induction Induction Type: Inhalational induction with existing ETT Placement Confirmation: positive ETCO2 and breath sounds checked- equal and bilateral Dental Injury: Teeth and Oropharynx as per pre-operative assessment

## 2021-03-11 NOTE — Op Note (Signed)
Operative Note  Date: 03/11/2021  Procedure: percutaneous tracheostomy without bronchoscopic assistance, esophagogastroduodenoscopy (EGD) and percutaneous endoscopic gastrostomy (PEG) tube placement  Pre-op diagnosis: prolonged mechanical ventilation Post-op diagnosis: same  Indication and clinical history: The patient  is a 35 y.o. year old male with prolonged mechanical ventilation and dysphagia.  Surgeon: Jesusita Oka, MD Assistant: Maxwell Caul, [place]  Anesthesia: General  Findings:  Specimen: none EBL: <5cc  Drains/Implants: #6 Shiley cuffed  tracheostomy tube, PEG tube, 2cm at the skin    Disposition: ICU  Description of procedure: The patient was positioned supine with a shoulder roll. Time-out was performed verifying correct patient, procedure, signature of informed consent, and pre-operative antibiotics as indicated. MAC induction was uneventful and the patient was confirmed to be on 100% FiO2. The neck was prepped and draped in the usual sterile fashion. Palpation of neck anatomy was performed. A longitudinal incision was made in the neck and deepened down to the trachea.   The pilot balloon was deflated and the endotracheal tube slowly retracted proximally until the tip of the endotracheal tube was palpated just below the cricoid cartilage. An introducer needle was inserted between the second and third tracheal rings. A guidewire was passed through the introducer needle and the needle removed. Serial dilation was performed and a #6   cuffed Shiley and stylet were inserted over the guidewire and the guidewire and stylet removed. The inner cannula was inserted, the pilot balloon on the tracheostomy inflated, and the ventilator tubing disconnected from the endotracheal tube and connected to the tracheostomy. Chest rise, appropriate end-tidal CO2, and return tidal volumes were confirmed. The tracheostomy tube was sutured in four quadrants and a tracheostomy tie applied to the neck. The  endotracheal tube was removed. The patient tolerated the procedure well. There were no complications. Post-procedure chest x-ray was ordered to confirm tube position and the absence of a pneumothorax.  Next, the endoscope was inserted into the oropharynx and advanced down the esophagus into the stomach and into the duodenum. The visualized esophagus and duodenum were unremarkable. The endoscope was retracted back into the stomach and the stomach was insufflated. The stomach was inspected and was also normal. Transillumination was performed. The light was visible on the external skin and dimpling of the stomach was noted endoscopically with manual pressure. The abdomen was prepped and draped in the usual sterile fashion. Transillumination and dimpling were repeated and local anesthetic was infiltrated to make a skin wheal at the site of transillumination. The needle was inserted perpendicularly to the skin and the tip of the needle was visualized endoscopically. As the needle was retracted, the tract was also anesthetized. A skin nick was made at the site of the wheal and an introducer needle and sheath were inserted. The needle was removed and guidewire inserted. The guidewire was grasped by an endoscopic snare and the snare, guidewire, and endoscope retracted out of the oropharynx. The PEG tube was secured to the guidewire and retracted through the mouth and esophagus into the stomach. The PEG tube was secured with a bolster and was visualized endoscopically to spin freely circumferentially and also be without gaps between the internal bumper and the stomach wall. There was no evidence of bleeding. The PEG bolster was secured at 2cm at the skin and there were no gaps between the bolster and the abdominal wall. The stomach was desufflated endoscopically and the endoscope removed. The bite block was also removed. The patient tolerated the procedure well and there were no complications.  The patient may have  water and medications administered via the PEG tube beginning immediately and tube feeds may be initiated four hours post-procedure.    Jesusita Oka, MD General and [facility]

## 2021-03-11 NOTE — Anesthesia Postprocedure Evaluation (Signed)
Anesthesia Post Note  Patient: Scott [name]  Procedure(s) Performed: TRACHEOSTOMY PERCUTANEOUS ENDOSCOPIC GASTROSTOMY (PEG) PLACEMENT     Patient location during evaluation: ICU Anesthesia Type: General Level of consciousness: sedated Pain management: pain level controlled Vital Signs Assessment: post-procedure vital signs reviewed and stable Respiratory status: nonlabored ventilation, respiratory function stable and patient on ventilator - see flowsheet for VS Cardiovascular status: blood pressure returned to baseline and stable Postop Assessment: no apparent nausea or vomiting Anesthetic complications: no   No notable events documented.  Last Vitals:  Vitals:   03/11/21 0500 03/11/21 0600  BP: 130/88 (!) 148/86  Pulse: (!) 122 (!) 129  Resp: (!) 24 (!) 27  Temp:    SpO2: 92% 90%    Last Pain:  Vitals:   03/11/21 0353  TempSrc: Axillary  PainSc:                  [name]

## 2021-03-11 NOTE — Procedures (Signed)
Arterial Catheter Insertion Procedure Note  Scott [name]  [id number]  1985/08/25  Date:03/11/21  Time:1:34 AM    Provider Performing: Hassan Buckler    Procedure: Insertion of Arterial Line (81771) without US guidance  Indication(s) Blood pressure monitoring and/or need for frequent ABGs  Consent Unable to obtain consent due to emergent nature of procedure.  Anesthesia None   Time Out Verified patient identification, verified procedure, site/side was marked, verified correct patient position, special equipment/implants available, medications/allergies/relevant history reviewed, required imaging and test results available.   Sterile Technique Maximal sterile technique including full sterile barrier drape, hand hygiene, sterile gown, sterile gloves, mask, hair covering, sterile ultrasound probe cover (if used).   Procedure Description Area of catheter insertion was cleaned with chlorhexidine and draped in sterile fashion. With real-time ultrasound guidance an arterial catheter was placed into the left radial artery.  Appropriate arterial tracings confirmed on monitor.     Complications/Tolerance None; patient tolerated the procedure well.   EBL Minimal   Specimen(s) None

## 2021-03-11 NOTE — Transfer of Care (Signed)
Immediate Anesthesia Transfer of Care Note  Patient: Scott [name]  Procedure(s) Performed: TRACHEOSTOMY PERCUTANEOUS ENDOSCOPIC GASTROSTOMY (PEG) PLACEMENT  Patient Location: SICU  Anesthesia Type:General  Level of Consciousness: sedated and Patient remains intubated per anesthesia plan  Airway & Oxygen Therapy: Patient remains intubated per anesthesia plan and Patient placed on Ventilator (see vital sign flow sheet for setting) trach  Post-op Assessment: Report given to RN and Post -op Vital signs reviewed and stable  Post vital signs: Reviewed and stable  Last Vitals:  Vitals Value Taken Time  BP    Temp    Pulse    Resp    SpO2      Last Pain:  Vitals:   03/11/21 0353  TempSrc: Axillary  PainSc:          Complications: No notable events documented.
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
## 2021-03-12 LAB — CBC WITH DIFFERENTIAL/PLATELET
Abs Immature Granulocytes: 0.18 10*3/uL — ABNORMAL HIGH (ref 0.00–0.07)
Basophils Absolute: 0.1 10*3/uL (ref 0.0–0.1)
Basophils Relative: 1 %
Eosinophils Absolute: 0.3 10*3/uL (ref 0.0–0.5)
Eosinophils Relative: 2 %
HCT: 29 % — ABNORMAL LOW (ref 39.0–52.0)
Hemoglobin: 8.9 g/dL — ABNORMAL LOW (ref 13.0–17.0)
Immature Granulocytes: 1 %
Lymphocytes Relative: 11 %
Lymphs Abs: 1.9 10*3/uL (ref 0.7–4.0)
MCH: 31.1 pg (ref 26.0–34.0)
MCHC: 30.7 g/dL (ref 30.0–36.0)
MCV: 101.4 fL — ABNORMAL HIGH (ref 80.0–100.0)
Monocytes Absolute: 1.1 10*3/uL — ABNORMAL HIGH (ref 0.1–1.0)
Monocytes Relative: 7 %
Neutro Abs: 13.2 10*3/uL — ABNORMAL HIGH (ref 1.7–7.7)
Neutrophils Relative %: 78 %
Platelets: 380 10*3/uL (ref 150–400)
RBC: 2.86 MIL/uL — ABNORMAL LOW (ref 4.22–5.81)
RDW: 17.2 % — ABNORMAL HIGH (ref 11.5–15.5)
WBC: 16.8 10*3/uL — ABNORMAL HIGH (ref 4.0–10.5)
nRBC: 0 % (ref 0.0–0.2)

## 2021-03-12 LAB — GLUCOSE, CAPILLARY
Glucose-Capillary: 141 mg/dL — ABNORMAL HIGH (ref 70–99)
Glucose-Capillary: 125 mg/dL — ABNORMAL HIGH (ref 70–99)
Glucose-Capillary: 124 mg/dL — ABNORMAL HIGH (ref 70–99)
Glucose-Capillary: 115 mg/dL — ABNORMAL HIGH (ref 70–99)
Glucose-Capillary: 140 mg/dL — ABNORMAL HIGH (ref 70–99)
Glucose-Capillary: 112 mg/dL — ABNORMAL HIGH (ref 70–99)

## 2021-03-12 LAB — BASIC METABOLIC PANEL
Anion gap: 8 (ref 5–15)
BUN: 31 mg/dL — ABNORMAL HIGH (ref 6–20)
CO2: 26 mmol/L (ref 22–32)
Calcium: 8.8 mg/dL — ABNORMAL LOW (ref 8.9–10.3)
Chloride: 118 mmol/L — ABNORMAL HIGH (ref 98–111)
Creatinine, Ser: 0.51 mg/dL — ABNORMAL LOW (ref 0.61–1.24)
GFR, Estimated: >60 mL/min (ref >60)
Glucose, Bld: 135 mg/dL — ABNORMAL HIGH (ref 70–99)
Potassium: 3.4 mmol/L — ABNORMAL LOW (ref 3.5–5.1)
Sodium: 152 mmol/L — ABNORMAL HIGH (ref 135–145)

## 2021-03-12 LAB — TRIGLYCERIDES: Triglycerides: 151 mg/dL — ABNORMAL HIGH (ref <150)

## 2021-03-12 MED ORDER — QUETIAPINE FUMARATE 100 MG PO TABS: 100.0000 mg | ORAL_TABLET | Freq: Two times a day (BID) | ORAL | Status: DC

## 2021-03-12 MED ORDER — VANCOMYCIN HCL 750 MG/150ML IV SOLN
750.0000 mg | Freq: Three times a day (TID) | INTRAVENOUS | Status: DC
  Administered 2021-03-12 – 2021-03-14 (×7): 750 mg via INTRAVENOUS
  Filled 2021-03-12 (×8): qty 150

## 2021-03-12 MED ORDER — POTASSIUM CHLORIDE 20 MEQ PO PACK
40.0000 meq | PACK | Freq: Once | ORAL | Status: AC
  Administered 2021-03-12: 40 meq
  Filled 2021-03-12: qty 2

## 2021-03-12 MED ORDER — CLONAZEPAM 0.5 MG PO TABS: 0.5000 mg | ORAL_TABLET | Freq: Two times a day (BID) | ORAL | Status: DC

## 2021-03-12 NOTE — Progress Notes (Signed)
Pt placed back on full vent support due to increased WOB, diaphoresis, and hypertension. Pt tolerating well, RN ware, RT will continue to monitor

## 2021-03-12 NOTE — Progress Notes (Signed)
SLP Cancellation Note  Patient Details Name: Scott [name] MRN: [id number] DOB: [birth year]   Cancelled treatment:        Pt received trach yesterday; on vent and currently having  WOB, diaphoresis, and hypertension. Will plan to return Monday 9/5   Royce Macadamia 03/12/2021, 9:34 AM [phone]
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
## 2021-03-13 ENCOUNTER — Inpatient Hospital Stay (HOSPITAL_COMMUNITY): Payer: 59

## 2021-03-13 LAB — GLUCOSE, CAPILLARY
Glucose-Capillary: 104 mg/dL — ABNORMAL HIGH (ref 70–99)
Glucose-Capillary: 120 mg/dL — ABNORMAL HIGH (ref 70–99)
Glucose-Capillary: 110 mg/dL — ABNORMAL HIGH (ref 70–99)
Glucose-Capillary: 132 mg/dL — ABNORMAL HIGH (ref 70–99)
Glucose-Capillary: 112 mg/dL — ABNORMAL HIGH (ref 70–99)
Glucose-Capillary: 95 mg/dL (ref 70–99)

## 2021-03-13 LAB — URINALYSIS, COMPLETE (UACMP) WITH MICROSCOPIC
Bacteria, UA: NONE SEEN
Bilirubin Urine: NEGATIVE
Glucose, UA: NEGATIVE mg/dL
Hgb urine dipstick: NEGATIVE
Ketones, ur: NEGATIVE mg/dL
Nitrite: NEGATIVE
Protein, ur: 30 mg/dL — AB
Specific Gravity, Urine: 1.033 — ABNORMAL HIGH (ref 1.005–1.030)
pH: 5 (ref 5.0–8.0)

## 2021-03-13 LAB — CBC WITH DIFFERENTIAL/PLATELET
Abs Immature Granulocytes: 0.1 10*3/uL — ABNORMAL HIGH (ref 0.00–0.07)
Basophils Absolute: 0.1 10*3/uL (ref 0.0–0.1)
Basophils Relative: 0 %
Eosinophils Absolute: 0.3 10*3/uL (ref 0.0–0.5)
Eosinophils Relative: 1 %
HCT: 25.2 % — ABNORMAL LOW (ref 39.0–52.0)
Hemoglobin: 7.4 g/dL — ABNORMAL LOW (ref 13.0–17.0)
Immature Granulocytes: 1 %
Lymphocytes Relative: 12 %
Lymphs Abs: 2 10*3/uL (ref 0.7–4.0)
MCH: 30.8 pg (ref 26.0–34.0)
MCHC: 29.4 g/dL — ABNORMAL LOW (ref 30.0–36.0)
MCV: 105 fL — ABNORMAL HIGH (ref 80.0–100.0)
Monocytes Absolute: 1 10*3/uL (ref 0.1–1.0)
Monocytes Relative: 6 %
Neutro Abs: 14 10*3/uL — ABNORMAL HIGH (ref 1.7–7.7)
Neutrophils Relative %: 80 %
Platelets: 334 10*3/uL (ref 150–400)
RBC: 2.4 MIL/uL — ABNORMAL LOW (ref 4.22–5.81)
RDW: 17.3 % — ABNORMAL HIGH (ref 11.5–15.5)
WBC: 17.4 10*3/uL — ABNORMAL HIGH (ref 4.0–10.5)
nRBC: 0 % (ref 0.0–0.2)

## 2021-03-13 LAB — BASIC METABOLIC PANEL
Anion gap: 7 (ref 5–15)
BUN: 27 mg/dL — ABNORMAL HIGH (ref 6–20)
CO2: 25 mmol/L (ref 22–32)
Calcium: 9.2 mg/dL (ref 8.9–10.3)
Chloride: 119 mmol/L — ABNORMAL HIGH (ref 98–111)
Creatinine, Ser: 0.47 mg/dL — ABNORMAL LOW (ref 0.61–1.24)
GFR, Estimated: >60 mL/min (ref >60)
Glucose, Bld: 137 mg/dL — ABNORMAL HIGH (ref 70–99)
Potassium: 3.6 mmol/L (ref 3.5–5.1)
Sodium: 151 mmol/L — ABNORMAL HIGH (ref 135–145)

## 2021-03-13 MED ORDER — SODIUM CHLORIDE 0.9 % IV SOLN
2.0000 g | Freq: Three times a day (TID) | INTRAVENOUS | Status: AC
  Administered 2021-03-13 – 2021-03-20 (×21): 2 g via INTRAVENOUS
  Filled 2021-03-13 (×21): qty 2
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
## 2021-03-14 LAB — CBC WITH DIFFERENTIAL/PLATELET
Abs Immature Granulocytes: 0.09 10*3/uL — ABNORMAL HIGH (ref 0.00–0.07)
Basophils Absolute: 0.1 10*3/uL (ref 0.0–0.1)
Basophils Relative: 0 %
Eosinophils Absolute: 0.6 10*3/uL — ABNORMAL HIGH (ref 0.0–0.5)
Eosinophils Relative: 4 %
HCT: 25.6 % — ABNORMAL LOW (ref 39.0–52.0)
Hemoglobin: 7.6 g/dL — ABNORMAL LOW (ref 13.0–17.0)
Immature Granulocytes: 1 %
Lymphocytes Relative: 13 %
Lymphs Abs: 1.8 10*3/uL (ref 0.7–4.0)
MCH: 29.8 pg (ref 26.0–34.0)
MCHC: 29.7 g/dL — ABNORMAL LOW (ref 30.0–36.0)
MCV: 100.4 fL — ABNORMAL HIGH (ref 80.0–100.0)
Monocytes Absolute: 0.8 10*3/uL (ref 0.1–1.0)
Monocytes Relative: 5 %
Neutro Abs: 11.2 10*3/uL — ABNORMAL HIGH (ref 1.7–7.7)
Neutrophils Relative %: 77 %
Platelets: 312 10*3/uL (ref 150–400)
RBC: 2.55 MIL/uL — ABNORMAL LOW (ref 4.22–5.81)
RDW: 18 % — ABNORMAL HIGH (ref 11.5–15.5)
WBC: 14.6 10*3/uL — ABNORMAL HIGH (ref 4.0–10.5)
nRBC: 0 % (ref 0.0–0.2)

## 2021-03-14 LAB — BASIC METABOLIC PANEL
Anion gap: 6 (ref 5–15)
BUN: 27 mg/dL — ABNORMAL HIGH (ref 6–20)
CO2: 25 mmol/L (ref 22–32)
Calcium: 9 mg/dL (ref 8.9–10.3)
Chloride: 119 mmol/L — ABNORMAL HIGH (ref 98–111)
Creatinine, Ser: 0.4 mg/dL — ABNORMAL LOW (ref 0.61–1.24)
GFR, Estimated: >60 mL/min (ref >60)
Glucose, Bld: 100 mg/dL — ABNORMAL HIGH (ref 70–99)
Potassium: 3.3 mmol/L — ABNORMAL LOW (ref 3.5–5.1)
Sodium: 150 mmol/L — ABNORMAL HIGH (ref 135–145)

## 2021-03-14 LAB — CBC
HCT: 22.3 % — ABNORMAL LOW (ref 39.0–52.0)
Hemoglobin: 6.6 g/dL — CL (ref 13.0–17.0)
MCH: 30.8 pg (ref 26.0–34.0)
MCHC: 29.6 g/dL — ABNORMAL LOW (ref 30.0–36.0)
MCV: 104.2 fL — ABNORMAL HIGH (ref 80.0–100.0)
Platelets: 319 10*3/uL (ref 150–400)
RBC: 2.14 MIL/uL — ABNORMAL LOW (ref 4.22–5.81)
RDW: 17.6 % — ABNORMAL HIGH (ref 11.5–15.5)
WBC: 13.4 10*3/uL — ABNORMAL HIGH (ref 4.0–10.5)
nRBC: 0 % (ref 0.0–0.2)

## 2021-03-14 LAB — GLUCOSE, CAPILLARY
Glucose-Capillary: 104 mg/dL — ABNORMAL HIGH (ref 70–99)
Glucose-Capillary: 90 mg/dL (ref 70–99)
Glucose-Capillary: 76 mg/dL (ref 70–99)
Glucose-Capillary: 116 mg/dL — ABNORMAL HIGH (ref 70–99)
Glucose-Capillary: 115 mg/dL — ABNORMAL HIGH (ref 70–99)
Glucose-Capillary: 102 mg/dL — ABNORMAL HIGH (ref 70–99)

## 2021-03-14 LAB — PREPARE RBC (CROSSMATCH)

## 2021-03-14 LAB — VANCOMYCIN, PEAK: Vancomycin Pk: 11 ug/mL — ABNORMAL LOW (ref 30–40)

## 2021-03-14 LAB — ABO/RH: ABO/RH(D): O POS

## 2021-03-14 MED ORDER — LABETALOL HCL 5 MG/ML IV SOLN
10.0000 mg | INTRAVENOUS | Status: DC | PRN
  Administered 2021-03-14 – 2021-03-15 (×2): 10 mg via INTRAVENOUS
  Filled 2021-03-14 (×2): qty 4

## 2021-03-14 MED ORDER — SODIUM CHLORIDE 0.9% IV SOLUTION: Freq: Once | INTRAVENOUS | Status: AC

## 2021-03-14 NOTE — Progress Notes (Signed)
Dilaudid drip disconnected at the top of the pump, the tubing was defective. Entire bag of dilaudid ~87 ml poured onto the floor when RN came into the room. The dilaudid puddle and tubing was witnessed by Gwynn Burly RN and Thelma Comp RN

## 2021-03-14 NOTE — Progress Notes (Signed)
Date and time results received: 03/14/21 0050  Test: Hemoglobin Critical Value: 6.6  Name of Provider Notified: Dr Freida Busman Trauma  Orders Received? Or Actions Taken?: Orders for 1 Unit of PRBCs

## 2021-03-14 NOTE — Progress Notes (Signed)
SLP Cancellation Note  Patient Details Name: ARMONDO CECH MRN: [id number] DOB: Nov 07, 1985   Cancelled treatment:        Reason eval/treat not completed: Pt intubated. Not yet responsive enough for inline PMV valve placement. Starting to follow some commands per RN today. SLP will continue to follow.   Jeannie Done, SLP-Student  Natoma Bertel Venard 03/14/2021, 8:43 AM
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
## 2021-03-15 LAB — CBC WITH DIFFERENTIAL/PLATELET
Abs Immature Granulocytes: 0.04 10*3/uL (ref 0.00–0.07)
Basophils Absolute: 0.1 10*3/uL (ref 0.0–0.1)
Basophils Relative: 1 %
Eosinophils Absolute: 0.7 10*3/uL — ABNORMAL HIGH (ref 0.0–0.5)
Eosinophils Relative: 7 %
HCT: 26.3 % — ABNORMAL LOW (ref 39.0–52.0)
Hemoglobin: 8 g/dL — ABNORMAL LOW (ref 13.0–17.0)
Immature Granulocytes: 0 %
Lymphocytes Relative: 16 %
Lymphs Abs: 1.5 10*3/uL (ref 0.7–4.0)
MCH: 30.7 pg (ref 26.0–34.0)
MCHC: 30.4 g/dL (ref 30.0–36.0)
MCV: 100.8 fL — ABNORMAL HIGH (ref 80.0–100.0)
Monocytes Absolute: 0.6 10*3/uL (ref 0.1–1.0)
Monocytes Relative: 6 %
Neutro Abs: 7 10*3/uL (ref 1.7–7.7)
Neutrophils Relative %: 70 %
Platelets: 352 10*3/uL (ref 150–400)
RBC: 2.61 MIL/uL — ABNORMAL LOW (ref 4.22–5.81)
RDW: 17.7 % — ABNORMAL HIGH (ref 11.5–15.5)
WBC: 9.9 10*3/uL (ref 4.0–10.5)
nRBC: 0 % (ref 0.0–0.2)

## 2021-03-15 LAB — TYPE AND SCREEN
ABO/RH(D): O POS
Antibody Screen: NEGATIVE
Unit division: 0

## 2021-03-15 LAB — CULTURE, RESPIRATORY W GRAM STAIN

## 2021-03-15 LAB — CBC
HCT: 23.8 % — ABNORMAL LOW (ref 39.0–52.0)
Hemoglobin: 7.1 g/dL — ABNORMAL LOW (ref 13.0–17.0)
MCH: 30.2 pg (ref 26.0–34.0)
MCHC: 29.8 g/dL — ABNORMAL LOW (ref 30.0–36.0)
MCV: 101.3 fL — ABNORMAL HIGH (ref 80.0–100.0)
Platelets: 300 10*3/uL (ref 150–400)
RBC: 2.35 MIL/uL — ABNORMAL LOW (ref 4.22–5.81)
RDW: 17.8 % — ABNORMAL HIGH (ref 11.5–15.5)
WBC: 10.9 10*3/uL — ABNORMAL HIGH (ref 4.0–10.5)
nRBC: 0 % (ref 0.0–0.2)

## 2021-03-15 LAB — BPAM RBC
Blood Product Expiration Date: 202209302359
ISSUE DATE / TIME: 202209050238
Unit Type and Rh: 5100

## 2021-03-15 LAB — BASIC METABOLIC PANEL
Anion gap: 5 (ref 5–15)
BUN: 23 mg/dL — ABNORMAL HIGH (ref 6–20)
CO2: 27 mmol/L (ref 22–32)
Calcium: 8.7 mg/dL — ABNORMAL LOW (ref 8.9–10.3)
Chloride: 118 mmol/L — ABNORMAL HIGH (ref 98–111)
Creatinine, Ser: 0.4 mg/dL — ABNORMAL LOW (ref 0.61–1.24)
GFR, Estimated: >60 mL/min (ref >60)
Glucose, Bld: 117 mg/dL — ABNORMAL HIGH (ref 70–99)
Potassium: 3.3 mmol/L — ABNORMAL LOW (ref 3.5–5.1)
Sodium: 150 mmol/L — ABNORMAL HIGH (ref 135–145)

## 2021-03-15 LAB — GLUCOSE, CAPILLARY
Glucose-Capillary: 101 mg/dL — ABNORMAL HIGH (ref 70–99)
Glucose-Capillary: 102 mg/dL — ABNORMAL HIGH (ref 70–99)
Glucose-Capillary: 106 mg/dL — ABNORMAL HIGH (ref 70–99)
Glucose-Capillary: 91 mg/dL (ref 70–99)
Glucose-Capillary: 128 mg/dL — ABNORMAL HIGH (ref 70–99)
Glucose-Capillary: 97 mg/dL (ref 70–99)

## 2021-03-15 LAB — VANCOMYCIN, PEAK: Vancomycin Pk: 20 ug/mL — ABNORMAL LOW (ref 30–40)

## 2021-03-15 LAB — TRIGLYCERIDES: Triglycerides: 87 mg/dL (ref <150)

## 2021-03-15 MED ORDER — DOCUSATE SODIUM 50 MG/5ML PO LIQD: 100.0000 mg | Freq: Two times a day (BID) | ORAL | Status: DC | PRN

## 2021-03-15 MED ORDER — NUTRISOURCE FIBER PO PACK
1.0000 | PACK | Freq: Two times a day (BID) | ORAL | Status: DC
  Administered 2021-03-15 – 2021-04-01 (×33): 1
  Filled 2021-03-15 (×37): qty 1

## 2021-03-15 MED ORDER — FREE WATER
200.0000 mL | Freq: Four times a day (QID) | Status: DC
  Administered 2021-03-15 – 2021-03-17 (×8): 200 mL

## 2021-03-15 MED ORDER — POLYETHYLENE GLYCOL 3350 17 G PO PACK
17.0000 g | PACK | Freq: Every day | ORAL | Status: DC | PRN
  Administered 2021-03-31: 17 g
  Filled 2021-03-15: qty 1

## 2021-03-15 MED ORDER — POTASSIUM CHLORIDE 20 MEQ PO PACK
20.0000 meq | PACK | Freq: Once | ORAL | Status: AC
  Administered 2021-03-15: 20 meq
  Filled 2021-03-15: qty 1

## 2021-03-15 MED ORDER — ACETAMINOPHEN 500 MG PO TABS
1000.0000 mg | ORAL_TABLET | Freq: Four times a day (QID) | ORAL | Status: DC
  Administered 2021-03-15 – 2021-03-17 (×8): 1000 mg
  Filled 2021-03-15 (×9): qty 2

## 2021-03-15 MED ORDER — VANCOMYCIN HCL IN DEXTROSE 1-5 GM/200ML-% IV SOLN
1000.0000 mg | Freq: Three times a day (TID) | INTRAVENOUS | Status: DC
  Administered 2021-03-15 – 2021-03-17 (×7): 1000 mg via INTRAVENOUS
  Filled 2021-03-15 (×7): qty 200
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
## 2021-03-15 NOTE — Progress Notes (Signed)
Patient ID: Scott [name], male   DOB: [birth year], 35 y.o.   MRN: [id number] Follow up - Trauma Critical Care  Patient Details:    Scott [name] is an 35 y.o. male.  Lines/tubes : PICC Triple Lumen 02/20/21 PICC Right Basilic 39 cm 0 cm (Active)  Indication for Insertion or Continuance of Line Prolonged intravenous therapies 03/15/21 0800  Exposed Catheter (cm) 1 cm 03/07/21 1200  Site Assessment Clean;Dry;Intact 03/15/21 0800  Lumen #1 Status Infusing 03/15/21 0800  Lumen #2 Status Flushed;Blood return noted 03/15/21 0800  Lumen #3 Status Flushed;Blood return noted 03/15/21 0800  Dressing Type Transparent 03/15/21 0800  Dressing Status Clean;Dry;Intact 03/15/21 0800  Antimicrobial disc in place? Yes 03/15/21 0800  Safety Lock Not Applicable 03/13/21 2100  Line Care Connections checked and tightened;Line pulled back 03/13/21 2100  Line Adjustment (NICU/IV Team Only) No 03/12/21 0800  Dressing Intervention Dressing changed;Antimicrobial disc changed;Securement device changed 03/14/21 0300  Dressing Change Due 03/21/21 03/15/21 0800     Arterial Line 03/11/21 Left Radial (Active)  Site Assessment Clean;Dry;Intact 03/15/21 0800  Line Status Pulsatile blood flow 03/15/21 0800  Art Line Waveform Appropriate 03/15/21 0800  Art Line Interventions Zeroed and calibrated;Leveled;Connections checked and tightened 03/14/21 2000  Color/Movement/Sensation Capillary refill less than 3 sec 03/14/21 0700  Dressing Type Transparent 03/15/21 0800  Dressing Status Clean;Dry;Intact 03/15/21 0800  Dressing Change Due 03/18/21 03/15/21 0800     Gastrostomy/Enterostomy Percutaneous endoscopic gastrostomy (PEG) 24 Fr. LUQ (Active)  Surrounding Skin Dry;Intact 03/14/21 2000  Tube Status Patent 03/14/21 2000  Drainage Appearance Serosanguineous 03/13/21 2000  Dressing Status Clean;Dry;Intact 03/14/21 2000  Dressing Intervention Dressing changed 03/13/21 0800  Dressing Type Split gauze;Abdominal  Binder 03/14/21 2000     Flatus Tube/Pouch (Active)  Daily care Skin around tube assessed 03/14/21 2000  Output (mL) 125 mL 03/15/21 0600  Intake (mL) 30 mL 03/08/21 2200     External Urinary Catheter (Active)  Collection Container Standard drainage bag 03/14/21 2000  Suction (Verified suction is between 40-80 mmHg) N/A (Patient has condom catheter) 03/14/21 2000  Securement Method Securing device (Describe) 03/14/21 2000  Site Assessment Clean;Intact 03/14/21 2000  Intervention Male External Urinary Catheter Replaced 03/13/21 0800  Output (mL) 140 mL 03/15/21 0751    Microbiology/Sepsis markers: Results for orders placed or performed during the hospital encounter of 02/15/21  Resp Panel by RT-PCR (Flu A&B, Covid) Nasopharyngeal Swab     Status: None   Collection Time: 02/15/21  8:14 PM   Specimen: Nasopharyngeal Swab; Nasopharyngeal(NP) swabs in vial transport medium  Result Value Ref Range Status   SARS Coronavirus 2 by RT PCR NEGATIVE NEGATIVE Final    Comment: (NOTE) SARS-CoV-2 target nucleic acids are NOT DETECTED.  The SARS-CoV-2 RNA is generally detectable in upper respiratory specimens during the acute phase of infection. The lowest concentration of SARS-CoV-2 viral copies this assay can detect is 138 copies/mL. A negative result does not preclude SARS-Cov-2 infection and should not be used as the sole basis for treatment or other patient management decisions. A negative result may occur with  improper specimen collection/handling, submission of specimen other than nasopharyngeal swab, presence of viral mutation(s) within the areas targeted by this assay, and inadequate number of viral copies(<138 copies/mL). A negative result must be combined with clinical observations, patient history, and epidemiological information. The expected result is Negative.  Fact Sheet for Patients:  BloggerCourse.com  Fact Sheet for Healthcare Providers:   SeriousBroker.it  This test is no t yet approved or cleared  by the [place] and  has been authorized for detection and/or diagnosis of SARS-CoV-2 by FDA under an Emergency Use Authorization (EUA). This EUA will remain  in effect (meaning this test can be used) for the duration of the COVID-19 declaration under Section 564(b)(1) of the Act, 21 U.S.C.section 360bbb-3(b)(1), unless the authorization is terminated  or revoked sooner.       Influenza A by PCR NEGATIVE NEGATIVE Final   Influenza B by PCR NEGATIVE NEGATIVE Final    Comment: (NOTE) The Xpert Xpress SARS-CoV-2/FLU/RSV plus assay is intended as an aid in the diagnosis of influenza from Nasopharyngeal swab specimens and should not be used as a sole basis for treatment. Nasal washings and aspirates are unacceptable for Xpert Xpress SARS-CoV-2/FLU/RSV testing.  Fact Sheet for Patients: BloggerCourse.com  Fact Sheet for Healthcare Providers: SeriousBroker.it  This test is not yet approved or cleared by the [place] FDA and has been authorized for detection and/or diagnosis of SARS-CoV-2 by FDA under an Emergency Use Authorization (EUA). This EUA will remain in effect (meaning this test can be used) for the duration of the COVID-19 declaration under Section 564(b)(1) of the Act, 21 U.S.C. section 360bbb-3(b)(1), unless the authorization is terminated or revoked.  Performed at [facility], [address] [place], [place] [address]   MRSA Next Gen by PCR, Nasal     Status: None   Collection Time: 02/15/21  9:58 PM   Specimen: Nasal Mucosa; Nasal Swab  Result Value Ref Range Status   MRSA by PCR Next Gen NOT DETECTED NOT DETECTED Final    Comment: (NOTE) The GeneXpert MRSA Assay (FDA approved for NASAL specimens only), is one component of a comprehensive MRSA colonization surveillance program. It is not intended to diagnose  MRSA infection nor to guide or monitor treatment for MRSA infections. Test performance is not FDA approved in patients less than 6 years old. Performed at [facility], [address], [place], [place] [address]   Culture, Respiratory w Gram Stain     Status: None   Collection Time: 02/17/21  2:04 PM   Specimen: Tracheal Aspirate; Respiratory  Result Value Ref Range Status   Specimen Description TRACHEAL ASPIRATE  Final   Special Requests NONE  Final   Gram Stain   Final    ABUNDANT WBC PRESENT,BOTH PMN AND MONONUCLEAR ABUNDANT GRAM NEGATIVE RODS MODERATE GRAM POSITIVE COCCI RARE GRAM POSITIVE RODS    Culture   Final    FEW Normal respiratory flora-no Staph aureus or Pseudomonas seen Performed at [facility], [address] [place], [place] [address]    Report Status 02/19/2021 FINAL  Final  Culture, blood (routine x 2)     Status: None   Collection Time: 02/17/21  2:24 PM   Specimen: BLOOD  Result Value Ref Range Status   Specimen Description BLOOD BLOOD RIGHT FOREARM  Final   Special Requests   Final    BOTTLES DRAWN AEROBIC AND ANAEROBIC Blood Culture adequate volume   Culture   Final    NO GROWTH 5 DAYS Performed at [facility], [address] [place], [place] [address]    Report Status 02/22/2021 FINAL  Final  Culture, blood (routine x 2)     Status: None   Collection Time: 02/17/21  3:01 PM   Specimen: BLOOD  Result Value Ref Range Status   Specimen Description BLOOD LEFT ANTECUBITAL  Final   Special Requests   Final  BOTTLES DRAWN AEROBIC AND ANAEROBIC Blood Culture adequate volume   Culture   Final    NO GROWTH 5 DAYS Performed at [facility], [address] [place], [place] [address]    Report Status 02/22/2021 FINAL  Final  Culture, Respiratory w Gram Stain     Status: None   Collection Time: 02/20/21 12:49 PM   Specimen: Tracheal Aspirate; Respiratory  Result Value Ref Range Status   Specimen Description TRACHEAL ASPIRATE   Final   Special Requests NONE  Final   Gram Stain   Final    MODERATE WBC PRESENT,BOTH PMN AND MONONUCLEAR ABUNDANT GRAM POSITIVE COCCI IN PAIRS ABUNDANT GRAM NEGATIVE RODS Performed at [facility], [address] [place], [place] [address]    Culture   Final    ABUNDANT METHICILLIN RESISTANT STAPHYLOCOCCUS AUREUS ABUNDANT PSEUDOMONAS AERUGINOSA    Report Status 02/23/2021 FINAL  Final   Organism ID, Bacteria METHICILLIN RESISTANT STAPHYLOCOCCUS AUREUS  Final   Organism ID, Bacteria PSEUDOMONAS AERUGINOSA  Final      Susceptibility   Methicillin resistant staphylococcus aureus - MIC*    CIPROFLOXACIN <=0.5 SENSITIVE Sensitive     ERYTHROMYCIN <=0.25 SENSITIVE Sensitive     GENTAMICIN <=0.5 SENSITIVE Sensitive     OXACILLIN >=4 RESISTANT Resistant     TETRACYCLINE <=1 SENSITIVE Sensitive     VANCOMYCIN 1 SENSITIVE Sensitive     TRIMETH/SULFA <=10 SENSITIVE Sensitive     CLINDAMYCIN <=0.25 SENSITIVE Sensitive     RIFAMPIN <=0.5 SENSITIVE Sensitive     Inducible Clindamycin NEGATIVE Sensitive     * ABUNDANT METHICILLIN RESISTANT STAPHYLOCOCCUS AUREUS   Pseudomonas aeruginosa - MIC*    CEFTAZIDIME 4 SENSITIVE Sensitive     CIPROFLOXACIN 0.5 SENSITIVE Sensitive     GENTAMICIN <=1 SENSITIVE Sensitive     IMIPENEM 2 SENSITIVE Sensitive     PIP/TAZO 8 SENSITIVE Sensitive     CEFEPIME 2 SENSITIVE Sensitive     * ABUNDANT PSEUDOMONAS AERUGINOSA  Monkeypox Virus DNA, Qualitative Real-Time PCR     Status: None   Collection Time: 02/23/21  1:42 AM   Specimen: Arm, Right; Sterile Swab  Result Value Ref Range Status   Orthopoxvirus DNA, QL PCR NOT DETECTED NOT DETECTED Final    Comment: (NOTE) Non-variola Orthopoxvirus DNA not detected by real-time PCR. Performed At: [facility] [address] [place], [place] [id number] Jolene Schimke MD WG:[id number]   Culture, Respiratory w Gram Stain     Status: None   Collection Time: 02/23/21 10:05 AM   Specimen: Tracheal  Aspirate; Respiratory  Result Value Ref Range Status   Specimen Description TRACHEAL ASPIRATE  Final   Special Requests NONE  Final   Gram Stain   Final    ABUNDANT WBC PRESENT, PREDOMINANTLY PMN FEW GRAM NEGATIVE RODS RARE GRAM POSITIVE COCCI Performed at [facility], [address], [place], [place] [address]    Culture   Final    MODERATE PSEUDOMONAS AERUGINOSA RARE METHICILLIN RESISTANT STAPHYLOCOCCUS AUREUS    Report Status 02/26/2021 FINAL  Final   Organism ID, Bacteria PSEUDOMONAS AERUGINOSA  Final   Organism ID, Bacteria METHICILLIN RESISTANT STAPHYLOCOCCUS AUREUS  Final      Susceptibility   Methicillin resistant staphylococcus aureus - MIC*    CIPROFLOXACIN <=0.5 SENSITIVE Sensitive     ERYTHROMYCIN <=0.25 SENSITIVE Sensitive     GENTAMICIN <=0.5 SENSITIVE Sensitive     OXACILLIN >=4 RESISTANT Resistant     TETRACYCLINE <=1 SENSITIVE Sensitive  VANCOMYCIN <=0.5 SENSITIVE Sensitive     TRIMETH/SULFA <=10 SENSITIVE Sensitive     CLINDAMYCIN <=0.25 SENSITIVE Sensitive     RIFAMPIN <=0.5 SENSITIVE Sensitive     Inducible Clindamycin NEGATIVE Sensitive     * RARE METHICILLIN RESISTANT STAPHYLOCOCCUS AUREUS   Pseudomonas aeruginosa - MIC*    CEFTAZIDIME 4 SENSITIVE Sensitive     CIPROFLOXACIN <=0.25 SENSITIVE Sensitive     GENTAMICIN <=1 SENSITIVE Sensitive     IMIPENEM 1 SENSITIVE Sensitive     PIP/TAZO 8 SENSITIVE Sensitive     CEFEPIME 2 SENSITIVE Sensitive     * MODERATE PSEUDOMONAS AERUGINOSA  Culture, blood (Routine X 2) w Reflex to ID Panel     Status: None   Collection Time: 02/24/21 10:53 AM   Specimen: BLOOD RIGHT HAND  Result Value Ref Range Status   Specimen Description BLOOD RIGHT HAND  Final   Special Requests   Final    BOTTLES DRAWN AEROBIC AND ANAEROBIC Blood Culture adequate volume   Culture   Final    NO GROWTH 5 DAYS Performed at [facility], [address] [place], [place] [address]    Report Status 03/01/2021 FINAL  Final   Culture, blood (Routine X 2) w Reflex to ID Panel     Status: None   Collection Time: 02/24/21 10:58 AM   Specimen: BLOOD LEFT HAND  Result Value Ref Range Status   Specimen Description BLOOD LEFT HAND  Final   Special Requests   Final    BOTTLES DRAWN AEROBIC AND ANAEROBIC Blood Culture adequate volume   Culture   Final    NO GROWTH 5 DAYS Performed at [facility], [address] [place], [place] [address]    Report Status 03/01/2021 FINAL  Final  SARS CORONAVIRUS 2 (TAT 6-24 HRS) Nasopharyngeal Nasopharyngeal Swab     Status: None   Collection Time: 02/25/21  9:20 AM   Specimen: Nasopharyngeal Swab  Result Value Ref Range Status   SARS Coronavirus 2 NEGATIVE NEGATIVE Final    Comment: (NOTE) SARS-CoV-2 target nucleic acids are NOT DETECTED.  The SARS-CoV-2 RNA is generally detectable in upper and lower respiratory specimens during the acute phase of infection. Negative results do not preclude SARS-CoV-2 infection, do not rule out co-infections with other pathogens, and should not be used as the sole basis for treatment or other patient management decisions. Negative results must be combined with clinical observations, patient history, and epidemiological information. The expected result is Negative.  Fact Sheet for Patients: HairSlick.no  Fact Sheet for Healthcare Providers: quierodirigir.com  This test is not yet approved or cleared by the [place] FDA and  has been authorized for detection and/or diagnosis of SARS-CoV-2 by FDA under an Emergency Use Authorization (EUA). This EUA will remain  in effect (meaning this test can be used) for the duration of the COVID-19 declaration under Se ction 564(b)(1) of the Act, 21 U.S.C. section 360bbb-3(b)(1), unless the authorization is terminated or revoked sooner.  Performed at [facility], [address] [place], [place] [address]   Culture,  Respiratory w Gram Stain     Status: None   Collection Time: 03/04/21 10:37 AM   Specimen: Tracheal Aspirate; Respiratory  Result Value Ref Range Status   Specimen Description TRACHEAL ASPIRATE  Final   Special Requests NONE  Final   Gram Stain   Final    FEW SQUAMOUS EPITHELIAL CELLS PRESENT MODERATE WBC PRESENT, PREDOMINANTLY PMN MODERATE GRAM NEGATIVE RODS  Culture   Final    FEW PSEUDOMONAS AERUGINOSA NO STAPHYLOCOCCUS AUREUS ISOLATED Performed at [facility], [address], [place], [place] [address]    Report Status 03/07/2021 FINAL  Final   Organism ID, Bacteria PSEUDOMONAS AERUGINOSA  Final      Susceptibility   Pseudomonas aeruginosa - MIC*    CEFTAZIDIME 16 INTERMEDIATE Intermediate     CIPROFLOXACIN <=0.25 SENSITIVE Sensitive     GENTAMICIN <=1 SENSITIVE Sensitive     IMIPENEM 2 SENSITIVE Sensitive     * FEW PSEUDOMONAS AERUGINOSA  Culture, Respiratory w Gram Stain     Status: None   Collection Time: 03/06/21  1:56 PM   Specimen: Tracheal Aspirate; Respiratory  Result Value Ref Range Status   Specimen Description TRACHEAL ASPIRATE  Final   Special Requests NONE  Final   Gram Stain   Final    MODERATE SQUAMOUS EPITHELIAL CELLS PRESENT MODERATE WBC PRESENT, PREDOMINANTLY PMN FEW GRAM NEGATIVE RODS FEW GRAM POSITIVE COCCI Performed at [facility], [address] [place], [place] [address]    Culture   Final    ABUNDANT PSEUDOMONAS AERUGINOSA ABUNDANT METHICILLIN RESISTANT STAPHYLOCOCCUS AUREUS    Report Status 03/09/2021 FINAL  Final   Organism ID, Bacteria PSEUDOMONAS AERUGINOSA  Final   Organism ID, Bacteria METHICILLIN RESISTANT STAPHYLOCOCCUS AUREUS  Final      Susceptibility   Methicillin resistant staphylococcus aureus - MIC*    CIPROFLOXACIN <=0.5 SENSITIVE Sensitive     ERYTHROMYCIN <=0.25 SENSITIVE Sensitive     GENTAMICIN <=0.5 SENSITIVE Sensitive     OXACILLIN >=4 RESISTANT Resistant     TETRACYCLINE <=1 SENSITIVE Sensitive      VANCOMYCIN <=0.5 SENSITIVE Sensitive     TRIMETH/SULFA <=10 SENSITIVE Sensitive     CLINDAMYCIN <=0.25 SENSITIVE Sensitive     RIFAMPIN <=0.5 SENSITIVE Sensitive     Inducible Clindamycin NEGATIVE Sensitive     * ABUNDANT METHICILLIN RESISTANT STAPHYLOCOCCUS AUREUS   Pseudomonas aeruginosa - MIC*    CEFTAZIDIME 2 SENSITIVE Sensitive     CIPROFLOXACIN <=0.25 SENSITIVE Sensitive     GENTAMICIN <=1 SENSITIVE Sensitive     IMIPENEM 0.5 SENSITIVE Sensitive     PIP/TAZO <=4 SENSITIVE Sensitive     CEFEPIME 2 SENSITIVE Sensitive     * ABUNDANT PSEUDOMONAS AERUGINOSA  Aerobic Culture w Gram Stain (superficial specimen)     Status: None   Collection Time: 03/06/21  3:13 PM   Specimen: Wound  Result Value Ref Range Status   Specimen Description WOUND  Final   Special Requests EAR RIGHT  Final   Gram Stain   Final    ABUNDANT WBC PRESENT,BOTH PMN AND MONONUCLEAR RARE SQUAMOUS EPITHELIAL CELLS PRESENT ABUNDANT GRAM NEGATIVE RODS FEW GRAM VARIABLE ROD FEW GRAM POSITIVE COCCI FEW YEAST Performed at [facility], [address] [place], [place] [address]    Culture   Final    MODERATE ENTEROBACTER CLOACAE FEW KLEBSIELLA PNEUMONIAE    Report Status 03/09/2021 FINAL  Final   Organism ID, Bacteria ENTEROBACTER CLOACAE  Final   Organism ID, Bacteria KLEBSIELLA PNEUMONIAE  Final      Susceptibility   Enterobacter cloacae - MIC*    CEFAZOLIN >=64 RESISTANT Resistant     CEFEPIME <=0.12 SENSITIVE Sensitive     CEFTAZIDIME <=1 SENSITIVE Sensitive     CIPROFLOXACIN <=0.25 SENSITIVE Sensitive     GENTAMICIN <=1 SENSITIVE Sensitive     IMIPENEM <=0.25 SENSITIVE Sensitive     TRIMETH/SULFA <=20 SENSITIVE  Sensitive     PIP/TAZO <=4 SENSITIVE Sensitive     * MODERATE ENTEROBACTER CLOACAE   Klebsiella pneumoniae - MIC*    AMPICILLIN >=32 RESISTANT Resistant     CEFAZOLIN <=4 SENSITIVE Sensitive     CEFEPIME <=0.12 SENSITIVE Sensitive     CEFTAZIDIME <=1 SENSITIVE Sensitive      CEFTRIAXONE <=0.25 SENSITIVE Sensitive     CIPROFLOXACIN <=0.25 SENSITIVE Sensitive     GENTAMICIN <=1 SENSITIVE Sensitive     IMIPENEM <=0.25 SENSITIVE Sensitive     TRIMETH/SULFA <=20 SENSITIVE Sensitive     AMPICILLIN/SULBACTAM 4 SENSITIVE Sensitive     PIP/TAZO <=4 SENSITIVE Sensitive     * FEW KLEBSIELLA PNEUMONIAE  Culture, Respiratory w Gram Stain     Status: None (Preliminary result)   Collection Time: 03/09/21 10:46 AM   Specimen: Tracheal Aspirate; Respiratory  Result Value Ref Range Status   Specimen Description TRACHEAL ASPIRATE  Final   Special Requests NONE  Final   Gram Stain   Final    FEW WBC PRESENT, PREDOMINANTLY PMN FEW GRAM NEGATIVE RODS    Culture   Final    ABUNDANT PSEUDOMONAS AERUGINOSA SUSCEPTIBILITIES TO FOLLOW Performed at [facility], [address] [place], [place] [address]    Report Status PENDING  Incomplete  Surgical pcr screen     Status: Abnormal   Collection Time: 03/11/21  7:26 AM   Specimen: Nasal Mucosa; Nasal Swab  Result Value Ref Range Status   MRSA, PCR POSITIVE (A) NEGATIVE Final    Comment: RESULT CALLED TO, READ BACK BY AND VERIFIED WITH: RN P.TRAVIS AT 0915 ON 03/11/2021 BY T.SAAD.    Staphylococcus aureus POSITIVE (A) NEGATIVE Final    Comment: (NOTE) The Xpert SA Assay (FDA approved for NASAL specimens in patients 20 years of age and older), is one component of a comprehensive surveillance program. It is not intended to diagnose infection nor to guide or monitor treatment. Performed at [facility], [address], [place], [place] [address]   Culture, blood (Routine X 2) w Reflex to ID Panel     Status: None (Preliminary result)   Collection Time: 03/13/21 11:15 AM   Specimen: BLOOD  Result Value Ref Range Status   Specimen Description BLOOD LEFT ANTECUBITAL  Final   Special Requests AEROBIC BOTTLE ONLY Blood Culture adequate volume  Final   Culture   Final    NO GROWTH 2 DAYS Performed at [facility], [address] [place], [place] [address]    Report Status PENDING  Incomplete  Culture, blood (Routine X 2) w Reflex to ID Panel     Status: None (Preliminary result)   Collection Time: 03/13/21 11:15 AM   Specimen: BLOOD  Result Value Ref Range Status   Specimen Description BLOOD BLOOD LEFT FOREARM  Final   Special Requests AEROBIC BOTTLE ONLY Blood Culture adequate volume  Final   Culture   Final    NO GROWTH 2 DAYS Performed at [facility], [address], [place], [place] [address]    Report Status PENDING  Incomplete    Anti-infectives:  Anti-infectives (From admission, onward)    Start     Dose/Rate Route Frequency Ordered Stop   03/15/21 0400  vancomycin (VANCOCIN) IVPB 1000 mg/200 mL premix        1,000 mg 200 mL/hr over 60 Minutes Intravenous Every 8 hours 03/15/21 0125     03/13/21 1200  ceFEPIme (MAXIPIME) 2 g  in sodium chloride 0.9 % 100 mL IVPB        2 g 200 mL/hr over 30 Minutes Intravenous Every 8 hours 03/13/21 1103     03/12/21 2200  vancomycin (VANCOREADY) IVPB 750 mg/150 mL  Status:  Discontinued        750 mg 150 mL/hr over 60 Minutes Intravenous Every 8 hours 03/12/21 1537 03/15/21 0125   03/12/21 0530  vancomycin (VANCOREADY) IVPB 750 mg/150 mL  Status:  Discontinued        750 mg 150 mL/hr over 60 Minutes Intravenous Every 8 hours 03/11/21 2145 03/12/21 1340   03/10/21 1400  piperacillin-tazobactam (ZOSYN) IVPB 3.375 g  Status:  Discontinued        3.375 g 12.5 mL/hr over 240 Minutes Intravenous Every 8 hours 03/10/21 0909 03/12/21 1340   03/10/21 1000  vancomycin (VANCOCIN) IVPB 1000 mg/200 mL premix  Status:  Discontinued        1,000 mg 200 mL/hr over 60 Minutes Intravenous Every 8 hours 03/10/21 0908 03/11/21 2145   03/06/21 1415  piperacillin-tazobactam (ZOSYN) IVPB 3.375 g        3.375 g 12.5 mL/hr over 240 Minutes Intravenous Every 8 hours 03/06/21 1324 03/10/21 0902   02/24/21 1200  vancomycin (VANCOCIN) IVPB 1000 mg/200 mL  premix        1,000 mg 200 mL/hr over 60 Minutes Intravenous Every 8 hours 02/24/21 0934 03/02/21 2020   02/23/21 0200  vancomycin (VANCOREADY) IVPB 1500 mg/300 mL  Status:  Discontinued        1,500 mg 150 mL/hr over 120 Minutes Intravenous Every 8 hours 02/22/21 1931 02/24/21 0934   02/22/21 2100  vancomycin (VANCOCIN) 250 mg in sodium chloride 0.9 % 100 mL IVPB        250 mg 100 mL/hr over 60 Minutes Intravenous  Once 02/22/21 2012 02/22/21 2150   02/22/21 2030  vancomycin (VANCOCIN) 250 mg in sodium chloride 0.9 % 500 mL IVPB  Status:  Discontinued        250 mg 250 mL/hr over 120 Minutes Intravenous  Once 02/22/21 1931 02/22/21 2012   02/21/21 1800  vancomycin (VANCOREADY) IVPB 1250 mg/250 mL  Status:  Discontinued        1,250 mg 166.7 mL/hr over 90 Minutes Intravenous Every 8 hours 02/21/21 0851 02/22/21 1931   02/21/21 1000  ceFEPIme (MAXIPIME) 2 g in sodium chloride 0.9 % 100 mL IVPB        2 g 200 mL/hr over 30 Minutes Intravenous Every 8 hours 02/21/21 0851 03/02/21 1749   02/21/21 1000  vancomycin (VANCOREADY) IVPB 2000 mg/400 mL        2,000 mg 200 mL/hr over 120 Minutes Intravenous  Once 02/21/21 0851 02/21/21 1306       Best Practice/Protocols:  VTE Prophylaxis: Lovenox (full dose) Intermittent Sedation  Consults: Treatment Team:  Serena Colonel, MD Myrene Galas, MD    Studies:    Events:  Subjective:    Overnight Issues:   Objective:  Vital signs for last 24 hours: Temp:  [98.3 F (36.8 C)-99.1 F (37.3 C)] 98.7 F (37.1 C) (09/06 0807) Pulse Rate:  [85-126] 88 (09/06 0900) Resp:  [12-23] 12 (09/06 0900) BP: (103-159)/(58-106) 148/80 (09/06 0900) SpO2:  [89 %-100 %] 97 % (09/06 0900) Arterial Line BP: (109-185)/(49-83) 159/73 (09/06 0900) FiO2 (%):  [50 %-60 %] 50 % (09/06 0753)  Hemodynamic parameters for last 24 hours:    Intake/Output from previous day: 09/05 0701 - 09/06 0700  In: 3202.2 [I.V.:110.3; NG/GT:2280; IV Piggyback:812] Out:  2036 [Urine:1836; Stool:200]  Intake/Output this shift: Total I/O In: 150 [I.V.:10; NG/GT:140] Out: 140 [Urine:140]  Vent settings for last 24 hours: Vent Mode: PSV;CPAP FiO2 (%):  [50 %-60 %] 50 % Set Rate:  [22 bmp] 22 bmp Vt Set:  [098 mL] 620 mL PEEP:  [5 cmH20-10 cmH20] 5 cmH20 Pressure Support:  [10 cmH20] 10 cmH20 Plateau Pressure:  [18 cmH20-26 cmH20] 19 cmH20  Physical Exam:  General: on vent Neuro: opens eyes to voice, F/C to move toes HEENT/Neck: trach-clean, intact Resp: some rhonchi CVS: RRR GI: soft, PEG in place Extremities: no edema  Results for orders placed or performed during the hospital encounter of 02/15/21 (from the past 24 hour(s))  Glucose, capillary     Status: None   Collection Time: 03/14/21 11:22 AM  Result Value Ref Range   Glucose-Capillary 76 70 - 99 mg/dL  Vancomycin, peak     Status: Abnormal   Collection Time: 03/14/21  1:22 PM  Result Value Ref Range   Vancomycin Pk 11 (L) 30 - 40 ug/mL  Glucose, capillary     Status: Abnormal   Collection Time: 03/14/21  3:31 PM  Result Value Ref Range   Glucose-Capillary 116 (H) 70 - 99 mg/dL  Glucose, capillary     Status: Abnormal   Collection Time: 03/14/21  8:05 PM  Result Value Ref Range   Glucose-Capillary 115 (H) 70 - 99 mg/dL  Glucose, capillary     Status: Abnormal   Collection Time: 03/14/21 11:12 PM  Result Value Ref Range   Glucose-Capillary 102 (H) 70 - 99 mg/dL  CBC     Status: Abnormal   Collection Time: 03/14/21 11:43 PM  Result Value Ref Range   WBC 10.9 (H) 4.0 - 10.5 K/uL   RBC 2.35 (L) 4.22 - 5.81 MIL/uL   Hemoglobin 7.1 (L) 13.0 - 17.0 g/dL   HCT 11.9 (L) 14.7 - 82.9 %   MCV 101.3 (H) 80.0 - 100.0 fL   MCH 30.2 26.0 - 34.0 pg   MCHC 29.8 (L) 30.0 - 36.0 g/dL   RDW 56.2 (H) 13.0 - 86.5 %   Platelets 300 150 - 400 K/uL   nRBC 0.0 0.0 - 0.2 %  Vancomycin, peak     Status: Abnormal   Collection Time: 03/15/21 12:00 AM  Result Value Ref Range   Vancomycin Pk 20 (L) 30 -  40 ug/mL  Glucose, capillary     Status: Abnormal   Collection Time: 03/15/21  3:51 AM  Result Value Ref Range   Glucose-Capillary 101 (H) 70 - 99 mg/dL  Triglycerides     Status: None   Collection Time: 03/15/21  5:00 AM  Result Value Ref Range   Triglycerides 87 <150 mg/dL  CBC with Differential/Platelet     Status: Abnormal   Collection Time: 03/15/21  5:00 AM  Result Value Ref Range   WBC 9.9 4.0 - 10.5 K/uL   RBC 2.61 (L) 4.22 - 5.81 MIL/uL   Hemoglobin 8.0 (L) 13.0 - 17.0 g/dL   HCT 78.4 (L) 69.6 - 29.5 %   MCV 100.8 (H) 80.0 - 100.0 fL   MCH 30.7 26.0 - 34.0 pg   MCHC 30.4 30.0 - 36.0 g/dL   RDW 28.4 (H) 13.2 - 44.0 %   Platelets 352 150 - 400 K/uL   nRBC 0.0 0.0 - 0.2 %   Neutrophils Relative % 70 %   Neutro Abs 7.0 1.7 -  7.7 K/uL   Lymphocytes Relative 16 %   Lymphs Abs 1.5 0.7 - 4.0 K/uL   Monocytes Relative 6 %   Monocytes Absolute 0.6 0.1 - 1.0 K/uL   Eosinophils Relative 7 %   Eosinophils Absolute 0.7 (H) 0.0 - 0.5 K/uL   Basophils Relative 1 %   Basophils Absolute 0.1 0.0 - 0.1 K/uL   Immature Granulocytes 0 %   Abs Immature Granulocytes 0.04 0.00 - 0.07 K/uL  Basic metabolic panel     Status: Abnormal   Collection Time: 03/15/21  5:00 AM  Result Value Ref Range   Sodium 150 (H) 135 - 145 mmol/L   Potassium 3.3 (L) 3.5 - 5.1 mmol/L   Chloride 118 (H) 98 - 111 mmol/L   CO2 27 22 - 32 mmol/L   Glucose, Bld 117 (H) 70 - 99 mg/dL   BUN 23 (H) 6 - 20 mg/dL   Creatinine, Ser 1.610.40 (L) 0.61 - 1.24 mg/dL   Calcium 8.7 (L) 8.9 - 10.3 mg/dL   GFR, Estimated >09>60 >60>60 mL/min   Anion gap 5 5 - 15  Glucose, capillary     Status: Abnormal   Collection Time: 03/15/21  8:06 AM  Result Value Ref Range   Glucose-Capillary 102 (H) 70 - 99 mg/dL    Assessment & Plan: Present on Admission:  Traumatic brain injury (HCC)    LOS: 28 days   Additional comments:I reviewed the patient's new clinical lab test results. Marland Kitchen. 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 Conchita ParisNundkumar, keppra x7d for sz ppx, repeat 4V Angio CT of neck 8/21 negative. 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  G1 L ICA injury - ASA 325 Facial laceration involving left eyelid - ENT c/s, Dr. Pollyann Kennedyosen, repaired 8/9 Skull fx extending ito L TMJ, L sphenoid sinus, R mastoid; R nasal bone fx - ENT c/s, Dr. Pollyann Kennedyosen, repeat exam when more neurologically appropriate R calf DVT - LMWH Severe ARDS - resolved, wean as tolerated ID/PNA - vanc/cefepime over weekend for fevers, send new resp CX, blood CX neg, afeb today. Ciprodex drops for otitis externa per Dr. Pollyann Kennedyosen. CV/Hypertensive urgency - off esmolol,  metoprolol to 150 q8h, labetalol prn L clavicle and L scapula fx - non-op per Dr. [address] rash, scattered abrasions - local wound care Alcohol abuse - CIWA H/o tobacco use disorder  FEN - g tube, goal TF, increase free water for hypernatremia, replete hypoklaemia DVT - SCDs, R calf DVT - therapeutic LMWH Foley - out, condom cath Dispo - ICU Critical Care Total Time*: 36 Minutes  Violeta GelinasBurke Vennesa Bastedo, MD, MPH, FACS Trauma & General Surgery Use [email or web address] to contact on call provider  03/15/2021  *Care during the described time interval was provided by me. I have reviewed this patient's available data, including medical history, events of note, physical examination and test results as part of my evaluation.

## 2021-03-16 LAB — GLUCOSE, CAPILLARY
Glucose-Capillary: 98 mg/dL (ref 70–99)
Glucose-Capillary: 85 mg/dL (ref 70–99)
Glucose-Capillary: 82 mg/dL (ref 70–99)
Glucose-Capillary: 113 mg/dL — ABNORMAL HIGH (ref 70–99)
Glucose-Capillary: 117 mg/dL — ABNORMAL HIGH (ref 70–99)
Glucose-Capillary: 114 mg/dL — ABNORMAL HIGH (ref 70–99)

## 2021-03-16 LAB — BASIC METABOLIC PANEL
Anion gap: 6 (ref 5–15)
BUN: 21 mg/dL — ABNORMAL HIGH (ref 6–20)
CO2: 30 mmol/L (ref 22–32)
Calcium: 9 mg/dL (ref 8.9–10.3)
Chloride: 109 mmol/L (ref 98–111)
Creatinine, Ser: <0.3 mg/dL — ABNORMAL LOW (ref 0.61–1.24)
Glucose, Bld: 130 mg/dL — ABNORMAL HIGH (ref 70–99)
Potassium: 3.3 mmol/L — ABNORMAL LOW (ref 3.5–5.1)
Sodium: 145 mmol/L (ref 135–145)

## 2021-03-16 LAB — CBC WITH DIFFERENTIAL/PLATELET
Abs Immature Granulocytes: 0.04 10*3/uL (ref 0.00–0.07)
Basophils Absolute: 0 10*3/uL (ref 0.0–0.1)
Basophils Relative: 1 %
Eosinophils Absolute: 0.6 10*3/uL — ABNORMAL HIGH (ref 0.0–0.5)
Eosinophils Relative: 8 %
HCT: 26.3 % — ABNORMAL LOW (ref 39.0–52.0)
Hemoglobin: 8 g/dL — ABNORMAL LOW (ref 13.0–17.0)
Immature Granulocytes: 1 %
Lymphocytes Relative: 17 %
Lymphs Abs: 1.3 10*3/uL (ref 0.7–4.0)
MCH: 30.8 pg (ref 26.0–34.0)
MCHC: 30.4 g/dL (ref 30.0–36.0)
MCV: 101.2 fL — ABNORMAL HIGH (ref 80.0–100.0)
Monocytes Absolute: 0.4 10*3/uL (ref 0.1–1.0)
Monocytes Relative: 5 %
Neutro Abs: 5.2 10*3/uL (ref 1.7–7.7)
Neutrophils Relative %: 68 %
Platelets: 330 10*3/uL (ref 150–400)
RBC: 2.6 MIL/uL — ABNORMAL LOW (ref 4.22–5.81)
RDW: 17.2 % — ABNORMAL HIGH (ref 11.5–15.5)
WBC: 7.6 10*3/uL (ref 4.0–10.5)
nRBC: 0 % (ref 0.0–0.2)

## 2021-03-16 MED ORDER — ENOXAPARIN SODIUM 60 MG/0.6ML IJ SOSY
60.0000 mg | PREFILLED_SYRINGE | Freq: Two times a day (BID) | INTRAMUSCULAR | Status: DC
  Administered 2021-03-16 – 2021-03-18 (×4): 60 mg via SUBCUTANEOUS
  Filled 2021-03-16 (×4): qty 0.6

## 2021-03-16 MED ORDER — POTASSIUM CHLORIDE 20 MEQ PO PACK
20.0000 meq | PACK | Freq: Once | ORAL | Status: AC
  Administered 2021-03-16: 20 meq
  Filled 2021-03-16: qty 1

## 2021-03-16 MED ORDER — OXYCODONE HCL 5 MG PO TABS
15.0000 mg | ORAL_TABLET | Freq: Four times a day (QID) | ORAL | Status: DC
Start: 2021-03-16 — End: 2021-03-25
  Administered 2021-03-16 – 2021-03-25 (×33): 15 mg
  Filled 2021-03-16 (×32): qty 3

## 2021-03-16 MED ORDER — VECURONIUM BROMIDE 10 MG IV SOLR: 10.0000 mg | Freq: Once | INTRAVENOUS | Status: AC

## 2021-03-16 MED ORDER — PROSOURCE TF PO LIQD
45.0000 mL | Freq: Every day | ORAL | Status: DC
  Administered 2021-03-16 – 2021-03-24 (×9): 45 mL
  Filled 2021-03-16 (×9): qty 45

## 2021-03-16 MED ORDER — VECURONIUM BROMIDE 10 MG IV SOLR
INTRAVENOUS | Status: AC
  Administered 2021-03-16: 10 mg
  Filled 2021-03-16: qty 10

## 2021-03-16 NOTE — Progress Notes (Signed)
Patient ID: Scott [name], male   DOB: 06/22/1986, 35 y.o.   MRN: [id number] Follow up - Trauma Critical Care  Patient Details:    Scott [name] is an 35 y.o. male.  Lines/tubes : PICC Triple Lumen 02/20/21 PICC Right Basilic 39 cm 0 cm (Active)  Indication for Insertion or Continuance of Line Prolonged intravenous therapies 03/15/21 2000  Exposed Catheter (cm) 1 cm 03/07/21 1200  Site Assessment Clean;Dry;Intact 03/15/21 2000  Lumen #1 Status Infusing 03/15/21 2000  Lumen #2 Status Infusing 03/15/21 2000  Lumen #3 Status Flushed;Saline locked;Blood return noted 03/15/21 2000  Dressing Type Transparent 03/15/21 2000  Dressing Status Clean;Dry;Intact 03/15/21 2000  Antimicrobial disc in place? Yes 03/15/21 2000  Safety Lock Not Applicable 03/13/21 2100  Line Care Connections checked and tightened;Line pulled back 03/13/21 2100  Line Adjustment (NICU/IV Team Only) No 03/12/21 0800  Dressing Intervention Dressing changed;Antimicrobial disc changed;Securement device changed 03/14/21 0300  Dressing Change Due 03/21/21 03/15/21 2000     Arterial Line 03/11/21 Left Radial (Active)  Site Assessment Clean;Dry;Intact 03/15/21 2000  Line Status Pulsatile blood flow 03/15/21 2000  Art Line Waveform Appropriate 03/15/21 2000  Art Line Interventions Zeroed and calibrated;Leveled;Connections checked and tightened 03/15/21 2000  Color/Movement/Sensation Capillary refill less than 3 sec 03/14/21 0700  Dressing Type Transparent 03/15/21 2000  Dressing Status Clean;Dry;Intact;Antimicrobial disc in place 03/15/21 2000  Dressing Change Due 03/18/21 03/15/21 2000     Gastrostomy/Enterostomy Percutaneous endoscopic gastrostomy (PEG) 24 Fr. LUQ (Active)  Surrounding Skin Dry;Intact 03/15/21 2000  Tube Status Patent 03/15/21 2000  Drainage Appearance Serosanguineous 03/13/21 2000  Dressing Status Clean;Dry;Intact 03/15/21 2000  Dressing Intervention Dressing changed 03/13/21 0800  Dressing Type  Split gauze;Abdominal Binder 03/15/21 2000     Flatus Tube/Pouch (Active)  Daily care Skin around tube assessed 03/15/21 2000  Output (mL) 200 mL 03/16/21 0600  Intake (mL) 30 mL 03/08/21 2200     External Urinary Catheter (Active)  Collection Container Standard drainage bag 03/15/21 2000  Suction (Verified suction is between 40-80 mmHg) N/A (Patient has condom catheter) 03/15/21 2000  Securement Method Other (Comment) 03/15/21 2000  Site Assessment Clean;Intact 03/15/21 2000  Intervention Male External Urinary Catheter Replaced 03/13/21 0800  Output (mL) 175 mL 03/16/21 0800    Microbiology/Sepsis markers: Results for orders placed or performed during the hospital encounter of 02/15/21  Resp Panel by RT-PCR (Flu A&B, Covid) Nasopharyngeal Swab     Status: None   Collection Time: 02/15/21  8:14 PM   Specimen: Nasopharyngeal Swab; Nasopharyngeal(NP) swabs in vial transport medium  Result Value Ref Range Status   SARS Coronavirus 2 by RT PCR NEGATIVE NEGATIVE Final    Comment: (NOTE) SARS-CoV-2 target nucleic acids are NOT DETECTED.  The SARS-CoV-2 RNA is generally detectable in upper respiratory specimens during the acute phase of infection. The lowest concentration of SARS-CoV-2 viral copies this assay can detect is 138 copies/mL. A negative result does not preclude SARS-Cov-2 infection and should not be used as the sole basis for treatment or other patient management decisions. A negative result may occur with  improper specimen collection/handling, submission of specimen other than nasopharyngeal swab, presence of viral mutation(s) within the areas targeted by this assay, and inadequate number of viral copies(<138 copies/mL). A negative result must be combined with clinical observations, patient history, and epidemiological information. The expected result is Negative.  Fact Sheet for Patients:  BloggerCourse.com  Fact Sheet for Healthcare  Providers:  SeriousBroker.it  This test is no t yet approved or  cleared by the [place] and  has been authorized for detection and/or diagnosis of SARS-CoV-2 by FDA under an Emergency Use Authorization (EUA). This EUA will remain  in effect (meaning this test can be used) for the duration of the COVID-19 declaration under Section 564(b)(1) of the Act, 21 U.S.C.section 360bbb-3(b)(1), unless the authorization is terminated  or revoked sooner.       Influenza A by PCR NEGATIVE NEGATIVE Final   Influenza B by PCR NEGATIVE NEGATIVE Final    Comment: (NOTE) The Xpert Xpress SARS-CoV-2/FLU/RSV plus assay is intended as an aid in the diagnosis of influenza from Nasopharyngeal swab specimens and should not be used as a sole basis for treatment. Nasal washings and aspirates are unacceptable for Xpert Xpress SARS-CoV-2/FLU/RSV testing.  Fact Sheet for Patients: BloggerCourse.com  Fact Sheet for Healthcare Providers: SeriousBroker.it  This test is not yet approved or cleared by the [place] FDA and has been authorized for detection and/or diagnosis of SARS-CoV-2 by FDA under an Emergency Use Authorization (EUA). This EUA will remain in effect (meaning this test can be used) for the duration of the COVID-19 declaration under Section 564(b)(1) of the Act, 21 U.S.C. section 360bbb-3(b)(1), unless the authorization is terminated or revoked.  Performed at [facility], [address], [place], [place] [address]   MRSA Next Gen by PCR, Nasal     Status: None   Collection Time: 02/15/21  9:58 PM   Specimen: Nasal Mucosa; Nasal Swab  Result Value Ref Range Status   MRSA by PCR Next Gen NOT DETECTED NOT DETECTED Final    Comment: (NOTE) The GeneXpert MRSA Assay (FDA approved for NASAL specimens only), is one component of a comprehensive MRSA colonization surveillance program. It is not intended to  diagnose MRSA infection nor to guide or monitor treatment for MRSA infections. Test performance is not FDA approved in patients less than 53 years old. Performed at [facility], [address], [place], [place] [address]   Culture, Respiratory w Gram Stain     Status: None   Collection Time: 02/17/21  2:04 PM   Specimen: Tracheal Aspirate; Respiratory  Result Value Ref Range Status   Specimen Description TRACHEAL ASPIRATE  Final   Special Requests NONE  Final   Gram Stain   Final    ABUNDANT WBC PRESENT,BOTH PMN AND MONONUCLEAR ABUNDANT GRAM NEGATIVE RODS MODERATE GRAM POSITIVE COCCI RARE GRAM POSITIVE RODS    Culture   Final    FEW Normal respiratory flora-no Staph aureus or Pseudomonas seen Performed at [facility], [address] [place], [place] [address]    Report Status 02/19/2021 FINAL  Final  Culture, blood (routine x 2)     Status: None   Collection Time: 02/17/21  2:24 PM   Specimen: BLOOD  Result Value Ref Range Status   Specimen Description BLOOD BLOOD RIGHT FOREARM  Final   Special Requests   Final    BOTTLES DRAWN AEROBIC AND ANAEROBIC Blood Culture adequate volume   Culture   Final    NO GROWTH 5 DAYS Performed at [facility], [address], [place], [place] [address]    Report Status 02/22/2021 FINAL  Final  Culture, blood (routine x 2)     Status: None   Collection Time: 02/17/21  3:01 PM   Specimen: BLOOD  Result Value Ref Range Status   Specimen Description BLOOD LEFT ANTECUBITAL  Final   Special Requests   Final  BOTTLES DRAWN AEROBIC AND ANAEROBIC Blood Culture adequate volume   Culture   Final    NO GROWTH 5 DAYS Performed at [facility], [address], [place], [place] [address]    Report Status 02/22/2021 FINAL  Final  Culture, Respiratory w Gram Stain     Status: None   Collection Time: 02/20/21 12:49 PM   Specimen: Tracheal Aspirate; Respiratory  Result Value Ref Range Status   Specimen Description TRACHEAL  ASPIRATE  Final   Special Requests NONE  Final   Gram Stain   Final    MODERATE WBC PRESENT,BOTH PMN AND MONONUCLEAR ABUNDANT GRAM POSITIVE COCCI IN PAIRS ABUNDANT GRAM NEGATIVE RODS Performed at [facility], [address], [place], [place] [address]    Culture   Final    ABUNDANT METHICILLIN RESISTANT STAPHYLOCOCCUS AUREUS ABUNDANT PSEUDOMONAS AERUGINOSA    Report Status 02/23/2021 FINAL  Final   Organism ID, Bacteria METHICILLIN RESISTANT STAPHYLOCOCCUS AUREUS  Final   Organism ID, Bacteria PSEUDOMONAS AERUGINOSA  Final      Susceptibility   Methicillin resistant staphylococcus aureus - MIC*    CIPROFLOXACIN <=0.5 SENSITIVE Sensitive     ERYTHROMYCIN <=0.25 SENSITIVE Sensitive     GENTAMICIN <=0.5 SENSITIVE Sensitive     OXACILLIN >=4 RESISTANT Resistant     TETRACYCLINE <=1 SENSITIVE Sensitive     VANCOMYCIN 1 SENSITIVE Sensitive     TRIMETH/SULFA <=10 SENSITIVE Sensitive     CLINDAMYCIN <=0.25 SENSITIVE Sensitive     RIFAMPIN <=0.5 SENSITIVE Sensitive     Inducible Clindamycin NEGATIVE Sensitive     * ABUNDANT METHICILLIN RESISTANT STAPHYLOCOCCUS AUREUS   Pseudomonas aeruginosa - MIC*    CEFTAZIDIME 4 SENSITIVE Sensitive     CIPROFLOXACIN 0.5 SENSITIVE Sensitive     GENTAMICIN <=1 SENSITIVE Sensitive     IMIPENEM 2 SENSITIVE Sensitive     PIP/TAZO 8 SENSITIVE Sensitive     CEFEPIME 2 SENSITIVE Sensitive     * ABUNDANT PSEUDOMONAS AERUGINOSA  Monkeypox Virus DNA, Qualitative Real-Time PCR     Status: None   Collection Time: 02/23/21  1:42 AM   Specimen: Arm, Right; Sterile Swab  Result Value Ref Range Status   Orthopoxvirus DNA, QL PCR NOT DETECTED NOT DETECTED Final    Comment: (NOTE) Non-variola Orthopoxvirus DNA not detected by real-time PCR. Performed At: [facility] [address] [place], [place] [id number] Jolene Schimke MD WG:[id number]   Culture, Respiratory w Gram Stain     Status: None   Collection Time: 02/23/21 10:05 AM   Specimen:  Tracheal Aspirate; Respiratory  Result Value Ref Range Status   Specimen Description TRACHEAL ASPIRATE  Final   Special Requests NONE  Final   Gram Stain   Final    ABUNDANT WBC PRESENT, PREDOMINANTLY PMN FEW GRAM NEGATIVE RODS RARE GRAM POSITIVE COCCI Performed at [facility], [address], [place], [place] [address]    Culture   Final    MODERATE PSEUDOMONAS AERUGINOSA RARE METHICILLIN RESISTANT STAPHYLOCOCCUS AUREUS    Report Status 02/26/2021 FINAL  Final   Organism ID, Bacteria PSEUDOMONAS AERUGINOSA  Final   Organism ID, Bacteria METHICILLIN RESISTANT STAPHYLOCOCCUS AUREUS  Final      Susceptibility   Methicillin resistant staphylococcus aureus - MIC*    CIPROFLOXACIN <=0.5 SENSITIVE Sensitive     ERYTHROMYCIN <=0.25 SENSITIVE Sensitive     GENTAMICIN <=0.5 SENSITIVE Sensitive     OXACILLIN >=4 RESISTANT Resistant     TETRACYCLINE <=1 SENSITIVE Sensitive  VANCOMYCIN <=0.5 SENSITIVE Sensitive     TRIMETH/SULFA <=10 SENSITIVE Sensitive     CLINDAMYCIN <=0.25 SENSITIVE Sensitive     RIFAMPIN <=0.5 SENSITIVE Sensitive     Inducible Clindamycin NEGATIVE Sensitive     * RARE METHICILLIN RESISTANT STAPHYLOCOCCUS AUREUS   Pseudomonas aeruginosa - MIC*    CEFTAZIDIME 4 SENSITIVE Sensitive     CIPROFLOXACIN <=0.25 SENSITIVE Sensitive     GENTAMICIN <=1 SENSITIVE Sensitive     IMIPENEM 1 SENSITIVE Sensitive     PIP/TAZO 8 SENSITIVE Sensitive     CEFEPIME 2 SENSITIVE Sensitive     * MODERATE PSEUDOMONAS AERUGINOSA  Culture, blood (Routine X 2) w Reflex to ID Panel     Status: None   Collection Time: 02/24/21 10:53 AM   Specimen: BLOOD RIGHT HAND  Result Value Ref Range Status   Specimen Description BLOOD RIGHT HAND  Final   Special Requests   Final    BOTTLES DRAWN AEROBIC AND ANAEROBIC Blood Culture adequate volume   Culture   Final    NO GROWTH 5 DAYS Performed at [facility], [address] [place], [place] [address]    Report Status 03/01/2021 FINAL   Final  Culture, blood (Routine X 2) w Reflex to ID Panel     Status: None   Collection Time: 02/24/21 10:58 AM   Specimen: BLOOD LEFT HAND  Result Value Ref Range Status   Specimen Description BLOOD LEFT HAND  Final   Special Requests   Final    BOTTLES DRAWN AEROBIC AND ANAEROBIC Blood Culture adequate volume   Culture   Final    NO GROWTH 5 DAYS Performed at [facility], [address], [place], [place] [address]    Report Status 03/01/2021 FINAL  Final  SARS CORONAVIRUS 2 (TAT 6-24 HRS) Nasopharyngeal Nasopharyngeal Swab     Status: None   Collection Time: 02/25/21  9:20 AM   Specimen: Nasopharyngeal Swab  Result Value Ref Range Status   SARS Coronavirus 2 NEGATIVE NEGATIVE Final    Comment: (NOTE) SARS-CoV-2 target nucleic acids are NOT DETECTED.  The SARS-CoV-2 RNA is generally detectable in upper and lower respiratory specimens during the acute phase of infection. Negative results do not preclude SARS-CoV-2 infection, do not rule out co-infections with other pathogens, and should not be used as the sole basis for treatment or other patient management decisions. Negative results must be combined with clinical observations, patient history, and epidemiological information. The expected result is Negative.  Fact Sheet for Patients: HairSlick.no  Fact Sheet for Healthcare Providers: quierodirigir.com  This test is not yet approved or cleared by the [place] FDA and  has been authorized for detection and/or diagnosis of SARS-CoV-2 by FDA under an Emergency Use Authorization (EUA). This EUA will remain  in effect (meaning this test can be used) for the duration of the COVID-19 declaration under Se ction 564(b)(1) of the Act, 21 U.S.C. section 360bbb-3(b)(1), unless the authorization is terminated or revoked sooner.  Performed at [facility], [address] [place], [place] [address]   Culture,  Respiratory w Gram Stain     Status: None   Collection Time: 03/04/21 10:37 AM   Specimen: Tracheal Aspirate; Respiratory  Result Value Ref Range Status   Specimen Description TRACHEAL ASPIRATE  Final   Special Requests NONE  Final   Gram Stain   Final    FEW SQUAMOUS EPITHELIAL CELLS PRESENT MODERATE WBC PRESENT, PREDOMINANTLY PMN MODERATE GRAM NEGATIVE RODS  Culture   Final    FEW PSEUDOMONAS AERUGINOSA NO STAPHYLOCOCCUS AUREUS ISOLATED Performed at [facility], [address], [place], [place] [address]    Report Status 03/07/2021 FINAL  Final   Organism ID, Bacteria PSEUDOMONAS AERUGINOSA  Final      Susceptibility   Pseudomonas aeruginosa - MIC*    CEFTAZIDIME 16 INTERMEDIATE Intermediate     CIPROFLOXACIN <=0.25 SENSITIVE Sensitive     GENTAMICIN <=1 SENSITIVE Sensitive     IMIPENEM 2 SENSITIVE Sensitive     * FEW PSEUDOMONAS AERUGINOSA  Culture, Respiratory w Gram Stain     Status: None   Collection Time: 03/06/21  1:56 PM   Specimen: Tracheal Aspirate; Respiratory  Result Value Ref Range Status   Specimen Description TRACHEAL ASPIRATE  Final   Special Requests NONE  Final   Gram Stain   Final    MODERATE SQUAMOUS EPITHELIAL CELLS PRESENT MODERATE WBC PRESENT, PREDOMINANTLY PMN FEW GRAM NEGATIVE RODS FEW GRAM POSITIVE COCCI Performed at [facility], [address] [place], [place] [address]    Culture   Final    ABUNDANT PSEUDOMONAS AERUGINOSA ABUNDANT METHICILLIN RESISTANT STAPHYLOCOCCUS AUREUS    Report Status 03/09/2021 FINAL  Final   Organism ID, Bacteria PSEUDOMONAS AERUGINOSA  Final   Organism ID, Bacteria METHICILLIN RESISTANT STAPHYLOCOCCUS AUREUS  Final      Susceptibility   Methicillin resistant staphylococcus aureus - MIC*    CIPROFLOXACIN <=0.5 SENSITIVE Sensitive     ERYTHROMYCIN <=0.25 SENSITIVE Sensitive     GENTAMICIN <=0.5 SENSITIVE Sensitive     OXACILLIN >=4 RESISTANT Resistant     TETRACYCLINE <=1 SENSITIVE Sensitive      VANCOMYCIN <=0.5 SENSITIVE Sensitive     TRIMETH/SULFA <=10 SENSITIVE Sensitive     CLINDAMYCIN <=0.25 SENSITIVE Sensitive     RIFAMPIN <=0.5 SENSITIVE Sensitive     Inducible Clindamycin NEGATIVE Sensitive     * ABUNDANT METHICILLIN RESISTANT STAPHYLOCOCCUS AUREUS   Pseudomonas aeruginosa - MIC*    CEFTAZIDIME 2 SENSITIVE Sensitive     CIPROFLOXACIN <=0.25 SENSITIVE Sensitive     GENTAMICIN <=1 SENSITIVE Sensitive     IMIPENEM 0.5 SENSITIVE Sensitive     PIP/TAZO <=4 SENSITIVE Sensitive     CEFEPIME 2 SENSITIVE Sensitive     * ABUNDANT PSEUDOMONAS AERUGINOSA  Aerobic Culture w Gram Stain (superficial specimen)     Status: None   Collection Time: 03/06/21  3:13 PM   Specimen: Wound  Result Value Ref Range Status   Specimen Description WOUND  Final   Special Requests EAR RIGHT  Final   Gram Stain   Final    ABUNDANT WBC PRESENT,BOTH PMN AND MONONUCLEAR RARE SQUAMOUS EPITHELIAL CELLS PRESENT ABUNDANT GRAM NEGATIVE RODS FEW GRAM VARIABLE ROD FEW GRAM POSITIVE COCCI FEW YEAST Performed at [facility], [address] [place], [place] [address]    Culture   Final    MODERATE ENTEROBACTER CLOACAE FEW KLEBSIELLA PNEUMONIAE    Report Status 03/09/2021 FINAL  Final   Organism ID, Bacteria ENTEROBACTER CLOACAE  Final   Organism ID, Bacteria KLEBSIELLA PNEUMONIAE  Final      Susceptibility   Enterobacter cloacae - MIC*    CEFAZOLIN >=64 RESISTANT Resistant     CEFEPIME <=0.12 SENSITIVE Sensitive     CEFTAZIDIME <=1 SENSITIVE Sensitive     CIPROFLOXACIN <=0.25 SENSITIVE Sensitive     GENTAMICIN <=1 SENSITIVE Sensitive     IMIPENEM <=0.25 SENSITIVE Sensitive     TRIMETH/SULFA <=20 SENSITIVE  Sensitive     PIP/TAZO <=4 SENSITIVE Sensitive     * MODERATE ENTEROBACTER CLOACAE   Klebsiella pneumoniae - MIC*    AMPICILLIN >=32 RESISTANT Resistant     CEFAZOLIN <=4 SENSITIVE Sensitive     CEFEPIME <=0.12 SENSITIVE Sensitive     CEFTAZIDIME <=1 SENSITIVE Sensitive      CEFTRIAXONE <=0.25 SENSITIVE Sensitive     CIPROFLOXACIN <=0.25 SENSITIVE Sensitive     GENTAMICIN <=1 SENSITIVE Sensitive     IMIPENEM <=0.25 SENSITIVE Sensitive     TRIMETH/SULFA <=20 SENSITIVE Sensitive     AMPICILLIN/SULBACTAM 4 SENSITIVE Sensitive     PIP/TAZO <=4 SENSITIVE Sensitive     * FEW KLEBSIELLA PNEUMONIAE  Culture, Respiratory w Gram Stain     Status: None   Collection Time: 03/09/21 10:46 AM   Specimen: Tracheal Aspirate; Respiratory  Result Value Ref Range Status   Specimen Description TRACHEAL ASPIRATE  Final   Special Requests NONE  Final   Gram Stain   Final    FEW WBC PRESENT, PREDOMINANTLY PMN FEW GRAM NEGATIVE RODS Performed at [facility], [address] [place], [place] [address]    Culture ABUNDANT PSEUDOMONAS AERUGINOSA  Final   Report Status 03/15/2021 FINAL  Final   Organism ID, Bacteria PSEUDOMONAS AERUGINOSA  Final      Susceptibility   Pseudomonas aeruginosa - MIC*    CEFTAZIDIME 4 SENSITIVE Sensitive     CIPROFLOXACIN <=0.25 SENSITIVE Sensitive     GENTAMICIN <=1 SENSITIVE Sensitive     IMIPENEM 2 SENSITIVE Sensitive     PIP/TAZO 8 SENSITIVE Sensitive     CEFEPIME 2 SENSITIVE Sensitive     * ABUNDANT PSEUDOMONAS AERUGINOSA  Surgical pcr screen     Status: Abnormal   Collection Time: 03/11/21  7:26 AM   Specimen: Nasal Mucosa; Nasal Swab  Result Value Ref Range Status   MRSA, PCR POSITIVE (A) NEGATIVE Final    Comment: RESULT CALLED TO, READ BACK BY AND VERIFIED WITH: RN P.TRAVIS AT 0915 ON 03/11/2021 BY T.SAAD.    Staphylococcus aureus POSITIVE (A) NEGATIVE Final    Comment: (NOTE) The Xpert SA Assay (FDA approved for NASAL specimens in patients 36 years of age and older), is one component of a comprehensive surveillance program. It is not intended to diagnose infection nor to guide or monitor treatment. Performed at [facility], [address] [place], [place] [address]   Culture, blood (Routine X 2) w Reflex to ID  Panel     Status: None (Preliminary result)   Collection Time: 03/13/21 11:15 AM   Specimen: BLOOD  Result Value Ref Range Status   Specimen Description BLOOD LEFT ANTECUBITAL  Final   Special Requests AEROBIC BOTTLE ONLY Blood Culture adequate volume  Final   Culture   Final    NO GROWTH 3 DAYS Performed at [facility], [address] [place], [place] [address]    Report Status PENDING  Incomplete  Culture, blood (Routine X 2) w Reflex to ID Panel     Status: None (Preliminary result)   Collection Time: 03/13/21 11:15 AM   Specimen: BLOOD  Result Value Ref Range Status   Specimen Description BLOOD BLOOD LEFT FOREARM  Final   Special Requests AEROBIC BOTTLE ONLY Blood Culture adequate volume  Final   Culture   Final    NO GROWTH 3 DAYS Performed at [facility], [address] [place], [place] [address]    Report Status PENDING  Incomplete  Culture, Respiratory w Gram Stain     Status: None (Preliminary result)   Collection Time: 03/15/21 10:18 AM   Specimen: Tracheal Aspirate; Respiratory  Result Value Ref Range Status   Specimen Description TRACHEAL ASPIRATE  Final   Special Requests NONE  Final   Gram Stain   Final    ABUNDANT WBC PRESENT, PREDOMINANTLY PMN NO ORGANISMS SEEN Performed at [facility], [address], [place], [place] [address]    Culture PENDING  Incomplete   Report Status PENDING  Incomplete    Anti-infectives:  Anti-infectives (From admission, onward)    Start     Dose/Rate Route Frequency Ordered Stop   03/15/21 0400  vancomycin (VANCOCIN) IVPB 1000 mg/200 mL premix        1,000 mg 200 mL/hr over 60 Minutes Intravenous Every 8 hours 03/15/21 0125     03/13/21 1200  ceFEPIme (MAXIPIME) 2 g in sodium chloride 0.9 % 100 mL IVPB        2 g 200 mL/hr over 30 Minutes Intravenous Every 8 hours 03/13/21 1103     03/12/21 2200  vancomycin (VANCOREADY) IVPB 750 mg/150 mL  Status:  Discontinued        750 mg 150 mL/hr over 60 Minutes  Intravenous Every 8 hours 03/12/21 1537 03/15/21 0125   03/12/21 0530  vancomycin (VANCOREADY) IVPB 750 mg/150 mL  Status:  Discontinued        750 mg 150 mL/hr over 60 Minutes Intravenous Every 8 hours 03/11/21 2145 03/12/21 1340   03/10/21 1400  piperacillin-tazobactam (ZOSYN) IVPB 3.375 g  Status:  Discontinued        3.375 g 12.5 mL/hr over 240 Minutes Intravenous Every 8 hours 03/10/21 0909 03/12/21 1340   03/10/21 1000  vancomycin (VANCOCIN) IVPB 1000 mg/200 mL premix  Status:  Discontinued        1,000 mg 200 mL/hr over 60 Minutes Intravenous Every 8 hours 03/10/21 0908 03/11/21 2145   03/06/21 1415  piperacillin-tazobactam (ZOSYN) IVPB 3.375 g        3.375 g 12.5 mL/hr over 240 Minutes Intravenous Every 8 hours 03/06/21 1324 03/10/21 0902   02/24/21 1200  vancomycin (VANCOCIN) IVPB 1000 mg/200 mL premix        1,000 mg 200 mL/hr over 60 Minutes Intravenous Every 8 hours 02/24/21 0934 03/02/21 2020   02/23/21 0200  vancomycin (VANCOREADY) IVPB 1500 mg/300 mL  Status:  Discontinued        1,500 mg 150 mL/hr over 120 Minutes Intravenous Every 8 hours 02/22/21 1931 02/24/21 0934   02/22/21 2100  vancomycin (VANCOCIN) 250 mg in sodium chloride 0.9 % 100 mL IVPB        250 mg 100 mL/hr over 60 Minutes Intravenous  Once 02/22/21 2012 02/22/21 2150   02/22/21 2030  vancomycin (VANCOCIN) 250 mg in sodium chloride 0.9 % 500 mL IVPB  Status:  Discontinued        250 mg 250 mL/hr over 120 Minutes Intravenous  Once 02/22/21 1931 02/22/21 2012   02/21/21 1800  vancomycin (VANCOREADY) IVPB 1250 mg/250 mL  Status:  Discontinued        1,250 mg 166.7 mL/hr over 90 Minutes Intravenous Every 8 hours 02/21/21 0851 02/22/21 1931   02/21/21 1000  ceFEPIme (MAXIPIME) 2 g in sodium chloride 0.9 % 100 mL IVPB        2 g 200 mL/hr over 30 Minutes Intravenous Every 8 hours 02/21/21 0851 03/02/21 1749   02/21/21  1000  vancomycin (VANCOREADY) IVPB 2000 mg/400 mL        2,000 mg 200 mL/hr over 120 Minutes  Intravenous  Once 02/21/21 0851 02/21/21 1306       Best Practice/Protocols:  VTE Prophylaxis: Lovenox (prophylaxtic dose) Continous Sedation  Consults: Treatment Team:  Serena Colonel, MD Myrene Galas, MD    Studies:    Events:  Subjective:    Overnight Issues:   Objective:  Vital signs for last 24 hours: Temp:  [98 F (36.7 C)-99.4 F (37.4 C)] 99.4 F (37.4 C) (09/07 0800) Pulse Rate:  [78-122] 94 (09/07 0800) Resp:  [9-29] 20 (09/07 0800) BP: (101-176)/(54-97) 112/97 (09/07 0800) SpO2:  [91 %-99 %] 95 % (09/07 0800) Arterial Line BP: (97-159)/(45-100) 124/52 (09/07 0700) FiO2 (%):  [40 %-50 %] 40 % (09/07 0729)  Hemodynamic parameters for last 24 hours:    Intake/Output from previous day: 09/06 0701 - 09/07 0700 In: 3277.5 [I.V.:297.3; NG/GT:2280; IV Piggyback:700.3] Out: 1474 [Urine:1174; Stool:300]  Intake/Output this shift: Total I/O In: -  Out: 175 [Urine:175]  Vent settings for last 24 hours: Vent Mode: PSV;CPAP FiO2 (%):  [40 %-50 %] 40 % Set Rate:  [16 bmp] 16 bmp Vt Set:  [259 mL] 620 mL PEEP:  [5 cmH20] 5 cmH20 Pressure Support:  [8 cmH20-10 cmH20] 8 cmH20 Plateau Pressure:  [15 cmH20-18 cmH20] 15 cmH20  Physical Exam:  General: increased RR on wean Neuro: F/C but very agitated HEENT/Neck: trach-clean, intact Resp: rhonchi bilaterally CVS: RRR GI: soft, PEG in place Extremities: ne edema  Results for orders placed or performed during the hospital encounter of 02/15/21 (from the past 24 hour(s))  Culture, Respiratory w Gram Stain     Status: None (Preliminary result)   Collection Time: 03/15/21 10:18 AM   Specimen: Tracheal Aspirate; Respiratory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

## 2021-03-16 NOTE — Progress Notes (Signed)
ENT follow-up.  He continues to be obtunded.  Unable to evaluate any facial movement.  Notes and drainage of the right ear canal.  Unable to visualize the drum.  Continue to drop.  We will check again in 1 week.

## 2021-03-16 NOTE — Progress Notes (Signed)
Inpatient Rehab Admissions Coordinator:   Per therapy recommendations, patient was screened for CIR candidacy by Megan Salon, MS, CCC-SLP . At this time, Pt. is tolerating only EOB for 5 minutes with Total assist. I do not believe he is able to tolerate intensity of CIR at this time and will not pursue consult.   Pt. may have potential to progress to becoming a potential CIR candidate, so CIR admissions team will follow and monitor for progress and participation with therapies and place consult order if Pt. appears to be an appropriate candidate. Please contact me with any questions.   Megan Salon, MS, CCC-SLP Rehab Admissions Coordinator  [phone] (celll) [phone] (office)
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
## 2021-03-16 NOTE — Progress Notes (Signed)
SLP Cancellation Note  Patient Details Name: CALISTRO RAUF MRN: [id number] DOB: [birth year]   Cancelled treatment:        Pt not appropriate for inline PMV after noted events of this moring with sats 76%, requiring bagging until recovered. Increased HR and BP. Will see for PMV inline or TC) when appropriate.   Royce Macadamia 03/16/2021, 12:23 PM  Breck Coons Lonell Face.Ed Nurse, children's [phone] Office [phone]
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
## 2021-03-16 NOTE — Progress Notes (Signed)
RT responded to vent alarms. Patient's RR 42.  Talked with Dr. Janee Morn and agreed to place back on full vent support. When RT arrived to patient's room sats 76%, increased WOB, increased HR & BP.  RT suctioned patient and used an ambu bag to manually ventilate the patient until his sats recovered.    Dr. Janee Morn at bedside, ordered sedation for patient.  Once patient was more calm, RT placed him back on full vent support.  Sats 89%.  RT did a Company secretary with Dr. Carollee Massed approval for 2 minutes. PC 30, peep 5, 100%, iTime 3.00.  Sats improved to 96% and patient placed back on his original vent settings.

## 2021-03-17 ENCOUNTER — Encounter (HOSPITAL_COMMUNITY): Payer: Self-pay | Admitting: Surgery

## 2021-03-17 LAB — CBC WITH DIFFERENTIAL/PLATELET
Abs Immature Granulocytes: 0.05 10*3/uL (ref 0.00–0.07)
Basophils Absolute: 0 10*3/uL (ref 0.0–0.1)
Basophils Relative: 0 %
Eosinophils Absolute: 0.4 10*3/uL (ref 0.0–0.5)
Eosinophils Relative: 4 %
HCT: 29.5 % — ABNORMAL LOW (ref 39.0–52.0)
Hemoglobin: 9.3 g/dL — ABNORMAL LOW (ref 13.0–17.0)
Immature Granulocytes: 1 %
Lymphocytes Relative: 11 %
Lymphs Abs: 1.1 10*3/uL (ref 0.7–4.0)
MCH: 31.3 pg (ref 26.0–34.0)
MCHC: 31.5 g/dL (ref 30.0–36.0)
MCV: 99.3 fL (ref 80.0–100.0)
Monocytes Absolute: 0.5 10*3/uL (ref 0.1–1.0)
Monocytes Relative: 5 %
Neutro Abs: 8.2 10*3/uL — ABNORMAL HIGH (ref 1.7–7.7)
Neutrophils Relative %: 79 %
Platelets: 367 10*3/uL (ref 150–400)
RBC: 2.97 MIL/uL — ABNORMAL LOW (ref 4.22–5.81)
RDW: 16.5 % — ABNORMAL HIGH (ref 11.5–15.5)
WBC: 10.3 10*3/uL (ref 4.0–10.5)
nRBC: 0 % (ref 0.0–0.2)

## 2021-03-17 LAB — BASIC METABOLIC PANEL
Anion gap: 5 (ref 5–15)
BUN: 15 mg/dL (ref 6–20)
CO2: 30 mmol/L (ref 22–32)
Calcium: 8.7 mg/dL — ABNORMAL LOW (ref 8.9–10.3)
Chloride: 108 mmol/L (ref 98–111)
Creatinine, Ser: 0.35 mg/dL — ABNORMAL LOW (ref 0.61–1.24)
GFR, Estimated: >60 mL/min (ref >60)
Glucose, Bld: 101 mg/dL — ABNORMAL HIGH (ref 70–99)
Potassium: 3.1 mmol/L — ABNORMAL LOW (ref 3.5–5.1)
Sodium: 143 mmol/L (ref 135–145)

## 2021-03-17 LAB — GLUCOSE, CAPILLARY
Glucose-Capillary: 90 mg/dL (ref 70–99)
Glucose-Capillary: 127 mg/dL — ABNORMAL HIGH (ref 70–99)
Glucose-Capillary: 107 mg/dL — ABNORMAL HIGH (ref 70–99)
Glucose-Capillary: 90 mg/dL (ref 70–99)
Glucose-Capillary: 115 mg/dL — ABNORMAL HIGH (ref 70–99)
Glucose-Capillary: 119 mg/dL — ABNORMAL HIGH (ref 70–99)

## 2021-03-17 MED ORDER — DEXMEDETOMIDINE HCL IN NACL 400 MCG/100ML IV SOLN
0.4000 ug/kg/h | INTRAVENOUS | Status: DC
  Administered 2021-03-17: 0.6 ug/kg/h via INTRAVENOUS
  Administered 2021-03-17: 0.4 ug/kg/h via INTRAVENOUS
  Administered 2021-03-18: 1.1 ug/kg/h via INTRAVENOUS
  Administered 2021-03-18 (×2): 1 ug/kg/h via INTRAVENOUS
  Administered 2021-03-19 (×4): 1.2 ug/kg/h via INTRAVENOUS
  Administered 2021-03-20: 0.9 ug/kg/h via INTRAVENOUS
  Administered 2021-03-20: 1.2 ug/kg/h via INTRAVENOUS
  Administered 2021-03-20 (×2): 1 ug/kg/h via INTRAVENOUS
  Administered 2021-03-21: 0.6 ug/kg/h via INTRAVENOUS
  Administered 2021-03-21: 1.3 ug/kg/h via INTRAVENOUS
  Administered 2021-03-22: 2 ug/kg/h via INTRAVENOUS
  Administered 2021-03-22: 0.9 ug/kg/h via INTRAVENOUS
  Administered 2021-03-22: 1.6 ug/kg/h via INTRAVENOUS
  Administered 2021-03-22: 1.8 ug/kg/h via INTRAVENOUS
  Administered 2021-03-22 – 2021-03-23 (×3): 2 ug/kg/h via INTRAVENOUS
  Administered 2021-03-23: 1.5 ug/kg/h via INTRAVENOUS
  Administered 2021-03-23 (×2): 2 ug/kg/h via INTRAVENOUS
  Administered 2021-03-23: 1 ug/kg/h via INTRAVENOUS
  Administered 2021-03-24: 0.8 ug/kg/h via INTRAVENOUS
  Administered 2021-03-24: 0.5 ug/kg/h via INTRAVENOUS
  Filled 2021-03-17 (×12): qty 100
  Filled 2021-03-17: qty 200
  Filled 2021-03-17 (×2): qty 100
  Filled 2021-03-17: qty 200
  Filled 2021-03-17 (×9): qty 100
  Filled 2021-03-17: qty 200
  Filled 2021-03-17: qty 100

## 2021-03-17 MED ORDER — ACETAMINOPHEN 500 MG PO TABS
1000.0000 mg | ORAL_TABLET | Freq: Three times a day (TID) | ORAL | Status: DC | PRN
  Administered 2021-03-21 – 2021-03-30 (×2): 1000 mg
  Filled 2021-03-17 (×2): qty 2

## 2021-03-17 MED ORDER — POTASSIUM CHLORIDE 20 MEQ PO PACK
40.0000 meq | PACK | Freq: Once | ORAL | Status: AC
  Administered 2021-03-17: 40 meq
  Filled 2021-03-17: qty 2

## 2021-03-17 MED ORDER — FREE WATER
200.0000 mL | Freq: Three times a day (TID) | Status: DC
  Administered 2021-03-17 – 2021-04-01 (×41): 200 mL

## 2021-03-17 MED ORDER — LOPERAMIDE HCL 1 MG/7.5ML PO SUSP
2.0000 mg | Freq: Two times a day (BID) | ORAL | Status: DC
  Administered 2021-03-17 – 2021-03-23 (×12): 2 mg
  Filled 2021-03-17 (×13): qty 15
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
## 2021-03-17 NOTE — Progress Notes (Signed)
Patient ID: Scott [name], male   DOB: Oct 28, 1985, 35 y.o.   MRN: [id number] Follow up - Trauma Critical Care  Patient Details:    Scott [name] is an 35 y.o. male.  Lines/tubes : PICC Triple Lumen 02/20/21 PICC Right Basilic 39 cm 0 cm (Active)  Indication for Insertion or Continuance of Line Prolonged intravenous therapies 03/17/21 0731  Exposed Catheter (cm) 1 cm 03/07/21 1200  Site Assessment Clean;Dry;Intact 03/17/21 0731  Lumen #1 Status Infusing 03/17/21 0731  Lumen #2 Status Infusing 03/17/21 0731  Lumen #3 Status Flushed;No blood return;Other (Comment) 03/17/21 0731  Dressing Type Transparent 03/17/21 0731  Dressing Status Clean;Dry;Intact 03/17/21 0731  Antimicrobial disc in place? Yes 03/17/21 0731  Safety Lock Not Applicable 03/17/21 0731  Line Care Connections checked and tightened;Line pulled back 03/17/21 0731  Line Adjustment (NICU/IV Team Only) No 03/12/21 0800  Dressing Intervention Dressing changed;Antimicrobial disc changed;Securement device changed 03/14/21 0300  Dressing Change Due 03/21/21 03/17/21 0731     Gastrostomy/Enterostomy Percutaneous endoscopic gastrostomy (PEG) 24 Fr. LUQ (Active)  Surrounding Skin Dry;Intact 03/16/21 2000  Tube Status Patent 03/16/21 2000  Drainage Appearance Serosanguineous 03/13/21 2000  Dressing Status Dry;Intact 03/16/21 2000  Dressing Intervention Dressing changed 03/16/21 0800  Dressing Type Split gauze 03/16/21 2000     Flatus Tube/Pouch (Active)  Daily care Skin around tube assessed 03/16/21 2000  Output (mL) 125 mL 03/17/21 0600  Intake (mL) 30 mL 03/08/21 2200     External Urinary Catheter (Active)  Collection Container Standard drainage bag 03/16/21 2000  Suction (Verified suction is between 40-80 mmHg) N/A (Patient has condom catheter) 03/16/21 2000  Securement Method Securing device (Describe) 03/16/21 2000  Site Assessment Clean;Intact 03/16/21 2000  Intervention Male External Urinary Catheter Replaced  03/13/21 0800  Output (mL) 425 mL 03/17/21 0751    Microbiology/Sepsis markers: Results for orders placed or performed during the hospital encounter of 02/15/21  Resp Panel by RT-PCR (Flu A&B, Covid) Nasopharyngeal Swab     Status: None   Collection Time: 02/15/21  8:14 PM   Specimen: Nasopharyngeal Swab; Nasopharyngeal(NP) swabs in vial transport medium  Result Value Ref Range Status   SARS Coronavirus 2 by RT PCR NEGATIVE NEGATIVE Final    Comment: (NOTE) SARS-CoV-2 target nucleic acids are NOT DETECTED.  The SARS-CoV-2 RNA is generally detectable in upper respiratory specimens during the acute phase of infection. The lowest concentration of SARS-CoV-2 viral copies this assay can detect is 138 copies/mL. A negative result does not preclude SARS-Cov-2 infection and should not be used as the sole basis for treatment or other patient management decisions. A negative result may occur with  improper specimen collection/handling, submission of specimen other than nasopharyngeal swab, presence of viral mutation(s) within the areas targeted by this assay, and inadequate number of viral copies(<138 copies/mL). A negative result must be combined with clinical observations, patient history, and epidemiological information. The expected result is Negative.  Fact Sheet for Patients:  BloggerCourse.com  Fact Sheet for Healthcare Providers:  SeriousBroker.it  This test is no t yet approved or cleared by the [place] FDA and  has been authorized for detection and/or diagnosis of SARS-CoV-2 by FDA under an Emergency Use Authorization (EUA). This EUA will remain  in effect (meaning this test can be used) for the duration of the COVID-19 declaration under Section 564(b)(1) of the Act, 21 U.S.C.section 360bbb-3(b)(1), unless the authorization is terminated  or revoked sooner.       Influenza A by PCR NEGATIVE NEGATIVE Final  Influenza B  by PCR NEGATIVE NEGATIVE Final    Comment: (NOTE) The Xpert Xpress SARS-CoV-2/FLU/RSV plus assay is intended as an aid in the diagnosis of influenza from Nasopharyngeal swab specimens and should not be used as a sole basis for treatment. Nasal washings and aspirates are unacceptable for Xpert Xpress SARS-CoV-2/FLU/RSV testing.  Fact Sheet for Patients: BloggerCourse.com  Fact Sheet for Healthcare Providers: SeriousBroker.it  This test is not yet approved or cleared by the [place] FDA and has been authorized for detection and/or diagnosis of SARS-CoV-2 by FDA under an Emergency Use Authorization (EUA). This EUA will remain in effect (meaning this test can be used) for the duration of the COVID-19 declaration under Section 564(b)(1) of the Act, 21 U.S.C. section 360bbb-3(b)(1), unless the authorization is terminated or revoked.  Performed at [facility], [address] [place], [place] [address]   MRSA Next Gen by PCR, Nasal     Status: None   Collection Time: 02/15/21  9:58 PM   Specimen: Nasal Mucosa; Nasal Swab  Result Value Ref Range Status   MRSA by PCR Next Gen NOT DETECTED NOT DETECTED Final    Comment: (NOTE) The GeneXpert MRSA Assay (FDA approved for NASAL specimens only), is one component of a comprehensive MRSA colonization surveillance program. It is not intended to diagnose MRSA infection nor to guide or monitor treatment for MRSA infections. Test performance is not FDA approved in patients less than 33 years old. Performed at [facility], [address], [place], [place] [address]   Culture, Respiratory w Gram Stain     Status: None   Collection Time: 02/17/21  2:04 PM   Specimen: Tracheal Aspirate; Respiratory  Result Value Ref Range Status   Specimen Description TRACHEAL ASPIRATE  Final   Special Requests NONE  Final   Gram Stain   Final    ABUNDANT WBC PRESENT,BOTH PMN AND  MONONUCLEAR ABUNDANT GRAM NEGATIVE RODS MODERATE GRAM POSITIVE COCCI RARE GRAM POSITIVE RODS    Culture   Final    FEW Normal respiratory flora-no Staph aureus or Pseudomonas seen Performed at [facility], [address] [place], [place] [address]    Report Status 02/19/2021 FINAL  Final  Culture, blood (routine x 2)     Status: None   Collection Time: 02/17/21  2:24 PM   Specimen: BLOOD  Result Value Ref Range Status   Specimen Description BLOOD BLOOD RIGHT FOREARM  Final   Special Requests   Final    BOTTLES DRAWN AEROBIC AND ANAEROBIC Blood Culture adequate volume   Culture   Final    NO GROWTH 5 DAYS Performed at [facility], [address] [place], [place] [address]    Report Status 02/22/2021 FINAL  Final  Culture, blood (routine x 2)     Status: None   Collection Time: 02/17/21  3:01 PM   Specimen: BLOOD  Result Value Ref Range Status   Specimen Description BLOOD LEFT ANTECUBITAL  Final   Special Requests   Final    BOTTLES DRAWN AEROBIC AND ANAEROBIC Blood Culture adequate volume   Culture   Final    NO GROWTH 5 DAYS Performed at [facility], [address], [place], [place] [address]    Report Status 02/22/2021 FINAL  Final  Culture, Respiratory w Gram Stain     Status: None   Collection Time: 02/20/21 12:49 PM   Specimen: Tracheal Aspirate; Respiratory  Result Value Ref Range  Status   Specimen Description TRACHEAL ASPIRATE  Final   Special Requests NONE  Final   Gram Stain   Final    MODERATE WBC PRESENT,BOTH PMN AND MONONUCLEAR ABUNDANT GRAM POSITIVE COCCI IN PAIRS ABUNDANT GRAM NEGATIVE RODS Performed at [facility], [address] [place], [place] [address]    Culture   Final    ABUNDANT METHICILLIN RESISTANT STAPHYLOCOCCUS AUREUS ABUNDANT PSEUDOMONAS AERUGINOSA    Report Status 02/23/2021 FINAL  Final   Organism ID, Bacteria METHICILLIN RESISTANT STAPHYLOCOCCUS AUREUS  Final   Organism ID, Bacteria PSEUDOMONAS AERUGINOSA   Final      Susceptibility   Methicillin resistant staphylococcus aureus - MIC*    CIPROFLOXACIN <=0.5 SENSITIVE Sensitive     ERYTHROMYCIN <=0.25 SENSITIVE Sensitive     GENTAMICIN <=0.5 SENSITIVE Sensitive     OXACILLIN >=4 RESISTANT Resistant     TETRACYCLINE <=1 SENSITIVE Sensitive     VANCOMYCIN 1 SENSITIVE Sensitive     TRIMETH/SULFA <=10 SENSITIVE Sensitive     CLINDAMYCIN <=0.25 SENSITIVE Sensitive     RIFAMPIN <=0.5 SENSITIVE Sensitive     Inducible Clindamycin NEGATIVE Sensitive     * ABUNDANT METHICILLIN RESISTANT STAPHYLOCOCCUS AUREUS   Pseudomonas aeruginosa - MIC*    CEFTAZIDIME 4 SENSITIVE Sensitive     CIPROFLOXACIN 0.5 SENSITIVE Sensitive     GENTAMICIN <=1 SENSITIVE Sensitive     IMIPENEM 2 SENSITIVE Sensitive     PIP/TAZO 8 SENSITIVE Sensitive     CEFEPIME 2 SENSITIVE Sensitive     * ABUNDANT PSEUDOMONAS AERUGINOSA  Monkeypox Virus DNA, Qualitative Real-Time PCR     Status: None   Collection Time: 02/23/21  1:42 AM   Specimen: Arm, Right; Sterile Swab  Result Value Ref Range Status   Orthopoxvirus DNA, QL PCR NOT DETECTED NOT DETECTED Final    Comment: (NOTE) Non-variola Orthopoxvirus DNA not detected by real-time PCR. Performed At: [facility] [address] [place], [place] [id number] Jolene Schimke MD QH:[id number]   Culture, Respiratory w Gram Stain     Status: None   Collection Time: 02/23/21 10:05 AM   Specimen: Tracheal Aspirate; Respiratory  Result Value Ref Range Status   Specimen Description TRACHEAL ASPIRATE  Final   Special Requests NONE  Final   Gram Stain   Final    ABUNDANT WBC PRESENT, PREDOMINANTLY PMN FEW GRAM NEGATIVE RODS RARE GRAM POSITIVE COCCI Performed at [facility], [address], [place], [place] [address]    Culture   Final    MODERATE PSEUDOMONAS AERUGINOSA RARE METHICILLIN RESISTANT STAPHYLOCOCCUS AUREUS    Report Status 02/26/2021 FINAL  Final   Organism ID, Bacteria PSEUDOMONAS AERUGINOSA  Final    Organism ID, Bacteria METHICILLIN RESISTANT STAPHYLOCOCCUS AUREUS  Final      Susceptibility   Methicillin resistant staphylococcus aureus - MIC*    CIPROFLOXACIN <=0.5 SENSITIVE Sensitive     ERYTHROMYCIN <=0.25 SENSITIVE Sensitive     GENTAMICIN <=0.5 SENSITIVE Sensitive     OXACILLIN >=4 RESISTANT Resistant     TETRACYCLINE <=1 SENSITIVE Sensitive     VANCOMYCIN <=0.5 SENSITIVE Sensitive     TRIMETH/SULFA <=10 SENSITIVE Sensitive     CLINDAMYCIN <=0.25 SENSITIVE Sensitive     RIFAMPIN <=0.5 SENSITIVE Sensitive     Inducible Clindamycin NEGATIVE Sensitive     * RARE METHICILLIN RESISTANT STAPHYLOCOCCUS AUREUS   Pseudomonas aeruginosa - MIC*    CEFTAZIDIME 4 SENSITIVE Sensitive     CIPROFLOXACIN <=0.25 SENSITIVE Sensitive     GENTAMICIN <=  1 SENSITIVE Sensitive     IMIPENEM 1 SENSITIVE Sensitive     PIP/TAZO 8 SENSITIVE Sensitive     CEFEPIME 2 SENSITIVE Sensitive     * MODERATE PSEUDOMONAS AERUGINOSA  Culture, blood (Routine X 2) w Reflex to ID Panel     Status: None   Collection Time: 02/24/21 10:53 AM   Specimen: BLOOD RIGHT HAND  Result Value Ref Range Status   Specimen Description BLOOD RIGHT HAND  Final   Special Requests   Final    BOTTLES DRAWN AEROBIC AND ANAEROBIC Blood Culture adequate volume   Culture   Final    NO GROWTH 5 DAYS Performed at [facility], [address] [place], [place] [address]    Report Status 03/01/2021 FINAL  Final  Culture, blood (Routine X 2) w Reflex to ID Panel     Status: None   Collection Time: 02/24/21 10:58 AM   Specimen: BLOOD LEFT HAND  Result Value Ref Range Status   Specimen Description BLOOD LEFT HAND  Final   Special Requests   Final    BOTTLES DRAWN AEROBIC AND ANAEROBIC Blood Culture adequate volume   Culture   Final    NO GROWTH 5 DAYS Performed at [facility], [address] [place], [place] [address]    Report Status 03/01/2021 FINAL  Final  SARS CORONAVIRUS 2 (TAT 6-24 HRS) Nasopharyngeal Nasopharyngeal  Swab     Status: None   Collection Time: 02/25/21  9:20 AM   Specimen: Nasopharyngeal Swab  Result Value Ref Range Status   SARS Coronavirus 2 NEGATIVE NEGATIVE Final    Comment: (NOTE) SARS-CoV-2 target nucleic acids are NOT DETECTED.  The SARS-CoV-2 RNA is generally detectable in upper and lower respiratory specimens during the acute phase of infection. Negative results do not preclude SARS-CoV-2 infection, do not rule out co-infections with other pathogens, and should not be used as the sole basis for treatment or other patient management decisions. Negative results must be combined with clinical observations, patient history, and epidemiological information. The expected result is Negative.  Fact Sheet for Patients: HairSlick.nohttps://www.fda.gov/media/138098/download  Fact Sheet for Healthcare Providers: quierodirigir.comhttps://www.fda.gov/media/138095/download  This test is not yet approved or cleared by the [place] FDA and  has been authorized for detection and/or diagnosis of SARS-CoV-2 by FDA under an Emergency Use Authorization (EUA). This EUA will remain  in effect (meaning this test can be used) for the duration of the COVID-19 declaration under Se ction 564(b)(1) of the Act, 21 U.S.C. section 360bbb-3(b)(1), unless the authorization is terminated or revoked sooner.  Performed at [facility], [address] [place], [place] [address]   Culture, Respiratory w Gram Stain     Status: None   Collection Time: 03/04/21 10:37 AM   Specimen: Tracheal Aspirate; Respiratory  Result Value Ref Range Status   Specimen Description TRACHEAL ASPIRATE  Final   Special Requests NONE  Final   Gram Stain   Final    FEW SQUAMOUS EPITHELIAL CELLS PRESENT MODERATE WBC PRESENT, PREDOMINANTLY PMN MODERATE GRAM NEGATIVE RODS    Culture   Final    FEW PSEUDOMONAS AERUGINOSA NO STAPHYLOCOCCUS AUREUS ISOLATED Performed at [facility], [address] [place], [place] [address]    Report  Status 03/07/2021 FINAL  Final   Organism ID, Bacteria PSEUDOMONAS AERUGINOSA  Final      Susceptibility   Pseudomonas aeruginosa - MIC*    CEFTAZIDIME 16 INTERMEDIATE Intermediate     CIPROFLOXACIN <=  0.25 SENSITIVE Sensitive     GENTAMICIN <=1 SENSITIVE Sensitive     IMIPENEM 2 SENSITIVE Sensitive     * FEW PSEUDOMONAS AERUGINOSA  Culture, Respiratory w Gram Stain     Status: None   Collection Time: 03/06/21  1:56 PM   Specimen: Tracheal Aspirate; Respiratory  Result Value Ref Range Status   Specimen Description TRACHEAL ASPIRATE  Final   Special Requests NONE  Final   Gram Stain   Final    MODERATE SQUAMOUS EPITHELIAL CELLS PRESENT MODERATE WBC PRESENT, PREDOMINANTLY PMN FEW GRAM NEGATIVE RODS FEW GRAM POSITIVE COCCI Performed at [facility], [address], [place], [place] [address]    Culture   Final    ABUNDANT PSEUDOMONAS AERUGINOSA ABUNDANT METHICILLIN RESISTANT STAPHYLOCOCCUS AUREUS    Report Status 03/09/2021 FINAL  Final   Organism ID, Bacteria PSEUDOMONAS AERUGINOSA  Final   Organism ID, Bacteria METHICILLIN RESISTANT STAPHYLOCOCCUS AUREUS  Final      Susceptibility   Methicillin resistant staphylococcus aureus - MIC*    CIPROFLOXACIN <=0.5 SENSITIVE Sensitive     ERYTHROMYCIN <=0.25 SENSITIVE Sensitive     GENTAMICIN <=0.5 SENSITIVE Sensitive     OXACILLIN >=4 RESISTANT Resistant     TETRACYCLINE <=1 SENSITIVE Sensitive     VANCOMYCIN <=0.5 SENSITIVE Sensitive     TRIMETH/SULFA <=10 SENSITIVE Sensitive     CLINDAMYCIN <=0.25 SENSITIVE Sensitive     RIFAMPIN <=0.5 SENSITIVE Sensitive     Inducible Clindamycin NEGATIVE Sensitive     * ABUNDANT METHICILLIN RESISTANT STAPHYLOCOCCUS AUREUS   Pseudomonas aeruginosa - MIC*    CEFTAZIDIME 2 SENSITIVE Sensitive     CIPROFLOXACIN <=0.25 SENSITIVE Sensitive     GENTAMICIN <=1 SENSITIVE Sensitive     IMIPENEM 0.5 SENSITIVE Sensitive     PIP/TAZO <=4 SENSITIVE Sensitive     CEFEPIME 2 SENSITIVE Sensitive     *  ABUNDANT PSEUDOMONAS AERUGINOSA  Aerobic Culture w Gram Stain (superficial specimen)     Status: None   Collection Time: 03/06/21  3:13 PM   Specimen: Wound  Result Value Ref Range Status   Specimen Description WOUND  Final   Special Requests EAR RIGHT  Final   Gram Stain   Final    ABUNDANT WBC PRESENT,BOTH PMN AND MONONUCLEAR RARE SQUAMOUS EPITHELIAL CELLS PRESENT ABUNDANT GRAM NEGATIVE RODS FEW GRAM VARIABLE ROD FEW GRAM POSITIVE COCCI FEW YEAST Performed at [facility], [address], [place], [place] [address]    Culture   Final    MODERATE ENTEROBACTER CLOACAE FEW KLEBSIELLA PNEUMONIAE    Report Status 03/09/2021 FINAL  Final   Organism ID, Bacteria ENTEROBACTER CLOACAE  Final   Organism ID, Bacteria KLEBSIELLA PNEUMONIAE  Final      Susceptibility   Enterobacter cloacae - MIC*    CEFAZOLIN >=64 RESISTANT Resistant     CEFEPIME <=0.12 SENSITIVE Sensitive     CEFTAZIDIME <=1 SENSITIVE Sensitive     CIPROFLOXACIN <=0.25 SENSITIVE Sensitive     GENTAMICIN <=1 SENSITIVE Sensitive     IMIPENEM <=0.25 SENSITIVE Sensitive     TRIMETH/SULFA <=20 SENSITIVE Sensitive     PIP/TAZO <=4 SENSITIVE Sensitive     * MODERATE ENTEROBACTER CLOACAE   Klebsiella pneumoniae - MIC*    AMPICILLIN >=32 RESISTANT Resistant     CEFAZOLIN <=4 SENSITIVE Sensitive     CEFEPIME <=0.12 SENSITIVE Sensitive     CEFTAZIDIME <=1 SENSITIVE Sensitive     CEFTRIAXONE <=0.25 SENSITIVE Sensitive     CIPROFLOXACIN <=0.25 SENSITIVE  Sensitive     GENTAMICIN <=1 SENSITIVE Sensitive     IMIPENEM <=0.25 SENSITIVE Sensitive     TRIMETH/SULFA <=20 SENSITIVE Sensitive     AMPICILLIN/SULBACTAM 4 SENSITIVE Sensitive     PIP/TAZO <=4 SENSITIVE Sensitive     * FEW KLEBSIELLA PNEUMONIAE  Culture, Respiratory w Gram Stain     Status: None   Collection Time: 03/09/21 10:46 AM   Specimen: Tracheal Aspirate; Respiratory  Result Value Ref Range Status   Specimen Description TRACHEAL ASPIRATE  Final   Special  Requests NONE  Final   Gram Stain   Final    FEW WBC PRESENT, PREDOMINANTLY PMN FEW GRAM NEGATIVE RODS Performed at [facility], [address], [place], [place] [address]    Culture ABUNDANT PSEUDOMONAS AERUGINOSA  Final   Report Status 03/15/2021 FINAL  Final   Organism ID, Bacteria PSEUDOMONAS AERUGINOSA  Final      Susceptibility   Pseudomonas aeruginosa - MIC*    CEFTAZIDIME 4 SENSITIVE Sensitive     CIPROFLOXACIN <=0.25 SENSITIVE Sensitive     GENTAMICIN <=1 SENSITIVE Sensitive     IMIPENEM 2 SENSITIVE Sensitive     PIP/TAZO 8 SENSITIVE Sensitive     CEFEPIME 2 SENSITIVE Sensitive     * ABUNDANT PSEUDOMONAS AERUGINOSA  Surgical pcr screen     Status: Abnormal   Collection Time: 03/11/21  7:26 AM   Specimen: Nasal Mucosa; Nasal Swab  Result Value Ref Range Status   MRSA, PCR POSITIVE (A) NEGATIVE Final    Comment: RESULT CALLED TO, READ BACK BY AND VERIFIED WITH: RN P.TRAVIS AT 0915 ON 03/11/2021 BY T.SAAD.    Staphylococcus aureus POSITIVE (A) NEGATIVE Final    Comment: (NOTE) The Xpert SA Assay (FDA approved for NASAL specimens in patients 52 years of age and older), is one component of a comprehensive surveillance program. It is not intended to diagnose infection nor to guide or monitor treatment. Performed at [facility], [address], [place], [place] [address]   Culture, blood (Routine X 2) w Reflex to ID Panel     Status: None (Preliminary result)   Collection Time: 03/13/21 11:15 AM   Specimen: BLOOD  Result Value Ref Range Status   Specimen Description BLOOD LEFT ANTECUBITAL  Final   Special Requests AEROBIC BOTTLE ONLY Blood Culture adequate volume  Final   Culture   Final    NO GROWTH 4 DAYS Performed at [facility], [address], [place], [place] [address]    Report Status PENDING  Incomplete  Culture, blood (Routine X 2) w Reflex to ID Panel     Status: None (Preliminary result)   Collection Time: 03/13/21 11:15 AM   Specimen:  BLOOD  Result Value Ref Range Status   Specimen Description BLOOD BLOOD LEFT FOREARM  Final   Special Requests AEROBIC BOTTLE ONLY Blood Culture adequate volume  Final   Culture   Final    NO GROWTH 4 DAYS Performed at [facility], [address] [place], [place] [address]    Report Status PENDING  Incomplete  Culture, Respiratory w Gram Stain     Status: None (Preliminary result)   Collection Time: 03/15/21 10:18 AM   Specimen: Tracheal Aspirate; Respiratory  Result Value Ref Range Status   Specimen Description TRACHEAL ASPIRATE  Final   Special Requests NONE  Final   Gram Stain   Final    ABUNDANT WBC PRESENT, PREDOMINANTLY PMN NO ORGANISMS SEEN Performed  at [facility], [address], [place], [place] [address]    Culture MODERATE GRAM NEGATIVE RODS  Final   Report Status PENDING  Incomplete    Anti-infectives:  Anti-infectives (From admission, onward)    Start     Dose/Rate Route Frequency Ordered Stop   03/15/21 0400  vancomycin (VANCOCIN) IVPB 1000 mg/200 mL premix  Status:  Discontinued        1,000 mg 200 mL/hr over 60 Minutes Intravenous Every 8 hours 03/15/21 0125 03/17/21 0928   03/13/21 1200  ceFEPIme (MAXIPIME) 2 g in sodium chloride 0.9 % 100 mL IVPB        2 g 200 mL/hr over 30 Minutes Intravenous Every 8 hours 03/13/21 1103 03/20/21 1159   03/12/21 2200  vancomycin (VANCOREADY) IVPB 750 mg/150 mL  Status:  Discontinued        750 mg 150 mL/hr over 60 Minutes Intravenous Every 8 hours 03/12/21 1537 03/15/21 0125   03/12/21 0530  vancomycin (VANCOREADY) IVPB 750 mg/150 mL  Status:  Discontinued        750 mg 150 mL/hr over 60 Minutes Intravenous Every 8 hours 03/11/21 2145 03/12/21 1340   03/10/21 1400  piperacillin-tazobactam (ZOSYN) IVPB 3.375 g  Status:  Discontinued        3.375 g 12.5 mL/hr over 240 Minutes Intravenous Every 8 hours 03/10/21 0909 03/12/21 1340   03/10/21 1000  vancomycin (VANCOCIN) IVPB 1000 mg/200 mL premix  Status:   Discontinued        1,000 mg 200 mL/hr over 60 Minutes Intravenous Every 8 hours 03/10/21 0908 03/11/21 2145   03/06/21 1415  piperacillin-tazobactam (ZOSYN) IVPB 3.375 g        3.375 g 12.5 mL/hr over 240 Minutes Intravenous Every 8 hours 03/06/21 1324 03/10/21 0902   02/24/21 1200  vancomycin (VANCOCIN) IVPB 1000 mg/200 mL premix        1,000 mg 200 mL/hr over 60 Minutes Intravenous Every 8 hours 02/24/21 0934 03/02/21 2020   02/23/21 0200  vancomycin (VANCOREADY) IVPB 1500 mg/300 mL  Status:  Discontinued        1,500 mg 150 mL/hr over 120 Minutes Intravenous Every 8 hours 02/22/21 1931 02/24/21 0934   02/22/21 2100  vancomycin (VANCOCIN) 250 mg in sodium chloride 0.9 % 100 mL IVPB        250 mg 100 mL/hr over 60 Minutes Intravenous  Once 02/22/21 2012 02/22/21 2150   02/22/21 2030  vancomycin (VANCOCIN) 250 mg in sodium chloride 0.9 % 500 mL IVPB  Status:  Discontinued        250 mg 250 mL/hr over 120 Minutes Intravenous  Once 02/22/21 1931 02/22/21 2012   02/21/21 1800  vancomycin (VANCOREADY) IVPB 1250 mg/250 mL  Status:  Discontinued        1,250 mg 166.7 mL/hr over 90 Minutes Intravenous Every 8 hours 02/21/21 0851 02/22/21 1931   02/21/21 1000  ceFEPIme (MAXIPIME) 2 g in sodium chloride 0.9 % 100 mL IVPB        2 g 200 mL/hr over 30 Minutes Intravenous Every 8 hours 02/21/21 0851 03/02/21 1749   02/21/21 1000  vancomycin (VANCOREADY) IVPB 2000 mg/400 mL        2,000 mg 200 mL/hr over 120 Minutes Intravenous  Once 02/21/21 0851 02/21/21 1306       Best Practice/Protocols:  VTE Prophylaxis: Lovenox (full dose) Intermittent Sedation  Consults: Treatment Team:  Serena Colonel, MD Myrene Galas, MD    Studies:  Events:  Subjective:    Overnight Issues:   Objective:  Vital signs for last 24 hours: Temp:  [98.5 F (36.9 C)-100.1 F (37.8 C)] 98.5 F (36.9 C) (09/08 0728) Pulse Rate:  [83-127] 110 (09/08 0728) Resp:  [0-31] 28 (09/08 0728) BP:  (103-170)/(56-110) 157/89 (09/08 0728) SpO2:  [94 %-100 %] 100 % (09/08 0728) FiO2 (%):  [40 %] 40 % (09/08 0728)  Hemodynamic parameters for last 24 hours:    Intake/Output from previous day: 09/07 0701 - 09/08 0700 In: 3764.4 [I.V.:288.1; NG/GT:2376.2; IV Piggyback:1100.1] Out: 2525 [Urine:2000; Stool:525]  Intake/Output this shift: Total I/O In: -  Out: 425 [Urine:425]  Vent settings for last 24 hours: Vent Mode: PSV;CPAP FiO2 (%):  [40 %] 40 % Set Rate:  [16 bmp] 16 bmp Vt Set:  [161 mL] 620 mL PEEP:  [5 cmH20] 5 cmH20 Pressure Support:  [5 cmH20] 5 cmH20 Plateau Pressure:  [14 cmH20-18 cmH20] 17 cmH20  Physical Exam:  General: on vent wean Neuro: agitated, does F/C HEENT/Neck: trach-clean, intact Resp: some rhonchi CVS: RRR GI: soft, PEG in place Extremities: no edema  Results for orders placed or performed during the hospital encounter of 02/15/21 (from the past 24 hour(s))  Glucose, capillary     Status: None   Collection Time: 03/16/21 11:13 AM  Result Value Ref Range   Glucose-Capillary 82 70 - 99 mg/dL  Glucose, capillary     Status: Abnormal   Collection Time: 03/16/21  3:44 PM  Result Value Ref Range   Glucose-Capillary 113 (H) 70 - 99 mg/dL  Glucose, capillary     Status: Abnormal   Collection Time: 03/16/21  7:37 PM  Result Value Ref Range   Glucose-Capillary 117 (H) 70 - 99 mg/dL  Glucose, capillary     Status: Abnormal   Collection Time: 03/16/21 11:19 PM  Result Value Ref Range   Glucose-Capillary 114 (H) 70 - 99 mg/dL  Glucose, capillary     Status: None   Collection Time: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

## 2021-03-17 NOTE — Progress Notes (Signed)
RT at beside for a PMV trial with Speech Therapy.  Patient's cuff slowly deflated, peep decreased to 0, PMV attached.  Patient's sats remained 95% or greater for the duration. After the trial ended, patient's cuff was re-inflated and the peep increased back to 5.
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
## 2021-03-18 LAB — CULTURE, RESPIRATORY W GRAM STAIN

## 2021-03-18 LAB — CBC WITH DIFFERENTIAL/PLATELET
Abs Immature Granulocytes: 0.04 10*3/uL (ref 0.00–0.07)
Basophils Absolute: 0 10*3/uL (ref 0.0–0.1)
Basophils Relative: 0 %
Eosinophils Absolute: 0.3 10*3/uL (ref 0.0–0.5)
Eosinophils Relative: 4 %
HCT: 30.5 % — ABNORMAL LOW (ref 39.0–52.0)
Hemoglobin: 9.6 g/dL — ABNORMAL LOW (ref 13.0–17.0)
Immature Granulocytes: 1 %
Lymphocytes Relative: 25 %
Lymphs Abs: 1.9 10*3/uL (ref 0.7–4.0)
MCH: 31 pg (ref 26.0–34.0)
MCHC: 31.5 g/dL (ref 30.0–36.0)
MCV: 98.4 fL (ref 80.0–100.0)
Monocytes Absolute: 0.5 10*3/uL (ref 0.1–1.0)
Monocytes Relative: 6 %
Neutro Abs: 5 10*3/uL (ref 1.7–7.7)
Neutrophils Relative %: 64 %
Platelets: 344 10*3/uL (ref 150–400)
RBC: 3.1 MIL/uL — ABNORMAL LOW (ref 4.22–5.81)
RDW: 17.2 % — ABNORMAL HIGH (ref 11.5–15.5)
WBC: 7.8 10*3/uL (ref 4.0–10.5)
nRBC: 0 % (ref 0.0–0.2)

## 2021-03-18 LAB — BASIC METABOLIC PANEL
Anion gap: 9 (ref 5–15)
BUN: 19 mg/dL (ref 6–20)
CO2: 29 mmol/L (ref 22–32)
Calcium: 9.7 mg/dL (ref 8.9–10.3)
Chloride: 106 mmol/L (ref 98–111)
Creatinine, Ser: 0.35 mg/dL — ABNORMAL LOW (ref 0.61–1.24)
GFR, Estimated: >60 mL/min (ref >60)
Glucose, Bld: 110 mg/dL — ABNORMAL HIGH (ref 70–99)
Potassium: 3.9 mmol/L (ref 3.5–5.1)
Sodium: 144 mmol/L (ref 135–145)

## 2021-03-18 LAB — GLUCOSE, CAPILLARY
Glucose-Capillary: 110 mg/dL — ABNORMAL HIGH (ref 70–99)
Glucose-Capillary: 108 mg/dL — ABNORMAL HIGH (ref 70–99)
Glucose-Capillary: 105 mg/dL — ABNORMAL HIGH (ref 70–99)
Glucose-Capillary: 137 mg/dL — ABNORMAL HIGH (ref 70–99)
Glucose-Capillary: 111 mg/dL — ABNORMAL HIGH (ref 70–99)
Glucose-Capillary: 118 mg/dL — ABNORMAL HIGH (ref 70–99)

## 2021-03-18 LAB — CULTURE, BLOOD (ROUTINE X 2)
Culture: NO GROWTH
Culture: NO GROWTH
Special Requests: ADEQUATE
Special Requests: ADEQUATE

## 2021-03-18 MED ORDER — ALTEPLASE 2 MG IJ SOLR
2.0000 mg | Freq: Once | INTRAMUSCULAR | Status: AC
  Administered 2021-03-18: 2 mg
  Filled 2021-03-18: qty 2

## 2021-03-18 MED ORDER — APIXABAN 5 MG PO TABS
5.0000 mg | ORAL_TABLET | Freq: Two times a day (BID) | ORAL | Status: DC
  Administered 2021-03-18 – 2021-04-01 (×27): 5 mg
  Filled 2021-03-18 (×28): qty 1

## 2021-03-18 MED ORDER — QUETIAPINE FUMARATE 100 MG PO TABS
200.0000 mg | ORAL_TABLET | Freq: Three times a day (TID) | ORAL | Status: DC
  Administered 2021-03-18 – 2021-03-27 (×26): 200 mg
  Filled 2021-03-18 (×10): qty 1
  Filled 2021-03-18: qty 2
  Filled 2021-03-18 (×2): qty 1
  Filled 2021-03-18: qty 2
  Filled 2021-03-18 (×4): qty 1
  Filled 2021-03-18: qty 2
  Filled 2021-03-18 (×7): qty 1
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
## 2021-03-18 NOTE — Discharge Instructions (Signed)

## 2021-03-18 NOTE — Progress Notes (Signed)
   03/18/21 2005  Vent Select  Invasive or Noninvasive Invasive  Adult Vent Y  Tracheostomy Shiley Flexible 6 mm Cuffed  Placement Date/Time: 03/11/21 0830   Placed By: Other (Comment)  Brand: Shiley Flexible  Size (mm): 6 mm  Style: Cuffed  Status Secured  Site Assessment Clean;Dry  Site Care Dried;Cleansed;Dressing applied;Protective barrier to skin  Inner Cannula Care (S)  Changed/new;Cleansed/dried  Ties Assessment Clean;Secure;Dry  Tracheostomy Equipment at bedside Yes and checklist posted at head of bed  Adult Ventilator Settings  Vent Type Servo i  Humidity HME  Vent Mode PRVC  Vt Set 620 mL  Set Rate 16 bmp  FiO2 (%) 40 %  PEEP 5 cmH20  Adult Ventilator Measurements  Peak Airway Pressure 16 L/min  Mean Airway Pressure 8 cmH20  Plateau Pressure 16 cmH20  Resp Rate Spontaneous 6 br/min  Resp Rate Total 22 br/min  Exhaled Vt 359 mL  Measured Ve 12.5 mL  I:E Ratio Measured 1:2.0  Auto PEEP 0 cmH20  Total PEEP 5 cmH20  Adult Ventilator Alarms  Alarms On Y  Ve High Alarm 21 L/min  Ve Low Alarm 4 L/min  Resp Rate High Alarm 40 br/min  Resp Rate Low Alarm 8  PEEP Low Alarm 3 cmH2O  Press High Alarm 45 cmH2O  T Apnea 20 sec(s)  VAP Prevention  HOB> 30 Degrees Y  Breath Sounds  Bilateral Breath Sounds Rhonchi  R Upper  Breath Sounds Rhonchi  L Upper Breath Sounds Rhonchi  R Lower Breath Sounds Rhonchi  L Lower Breath Sounds Rhonchi  Vent Respiratory Assessment  Patient Effort Poor  Airway Suctioning/Secretions  Suction Type Tracheal  Suction Device  Catheter  Secretion Amount Moderate  Secretion Color White;Yellow;Tan  Secretion Consistency Thick;Thin  Suction Tolerance Tolerated well  Placed pt. Back on vent after being on trach collar since 12pm pt. Tolerating current vent settings.\

## 2021-03-18 NOTE — Progress Notes (Signed)
Patient ID: Scott [name], male   DOB: [birth year], 35 y.o.   MRN: [id number] Follow up - Trauma Critical Care  Patient Details:    Scott [name] is an 35 y.o. male.  Lines/tubes : PICC Triple Lumen 02/20/21 PICC Right Basilic 39 cm 0 cm (Active)  Indication for Insertion or Continuance of Line Prolonged intravenous therapies 03/18/21 0730  Exposed Catheter (cm) 1 cm 03/07/21 1200  Site Assessment Clean;Dry;Intact 03/18/21 0730  Lumen #1 Status Infusing 03/18/21 0730  Lumen #2 Status Saline locked 03/18/21 0730  Lumen #3 Status Saline locked 03/18/21 0730  Dressing Type Transparent;Non-restrictive;Securing device 03/18/21 0730  Dressing Status Clean;Dry;Intact 03/18/21 0730  Antimicrobial disc in place? Yes 03/18/21 0730  Safety Lock Not Applicable 03/18/21 0730  Line Care Connections checked and tightened;Line pulled back 03/18/21 0730  Line Adjustment (NICU/IV Team Only) No 03/12/21 0800  Dressing Intervention Dressing changed;Antimicrobial disc changed;Securement device changed 03/14/21 0300  Dressing Change Due 03/21/21 03/18/21 0730     Gastrostomy/Enterostomy Percutaneous endoscopic gastrostomy (PEG) 24 Fr. LUQ (Active)  Surrounding Skin Dry;Intact 03/17/21 2000  Tube Status Patent 03/17/21 2000  Drainage Appearance Serosanguineous 03/13/21 2000  Dressing Status Clean;Dry;Intact 03/17/21 2000  Dressing Intervention Dressing changed 03/17/21 1530  Dressing Type Split gauze 03/17/21 2000     Flatus Tube/Pouch (Active)  Daily care Bag changed (every 24 hours);Skin around tube assessed 03/17/21 1216  Output (mL) 70 mL 03/18/21 0600  Intake (mL) 30 mL 03/08/21 2200     External Urinary Catheter (Active)  Collection Container Standard drainage bag 03/17/21 2000  Suction (Verified suction is between 40-80 mmHg) N/A (Patient has condom catheter) 03/17/21 2000  Securement Method Securing device (Describe) 03/17/21 2000  Site Assessment Clean;Dry;Intact 03/17/21 2000   Intervention Male External Urinary Catheter Replaced 03/17/21 2200  Output (mL) 150 mL 03/18/21 0600    Microbiology/Sepsis markers: Results for orders placed or performed during the hospital encounter of 02/15/21  Resp Panel by RT-PCR (Flu A&B, Covid) Nasopharyngeal Swab     Status: None   Collection Time: 02/15/21  8:14 PM   Specimen: Nasopharyngeal Swab; Nasopharyngeal(NP) swabs in vial transport medium  Result Value Ref Range Status   SARS Coronavirus 2 by RT PCR NEGATIVE NEGATIVE Final    Comment: (NOTE) SARS-CoV-2 target nucleic acids are NOT DETECTED.  The SARS-CoV-2 RNA is generally detectable in upper respiratory specimens during the acute phase of infection. The lowest concentration of SARS-CoV-2 viral copies this assay can detect is 138 copies/mL. A negative result does not preclude SARS-Cov-2 infection and should not be used as the sole basis for treatment or other patient management decisions. A negative result may occur with  improper specimen collection/handling, submission of specimen other than nasopharyngeal swab, presence of viral mutation(s) within the areas targeted by this assay, and inadequate number of viral copies(<138 copies/mL). A negative result must be combined with clinical observations, patient history, and epidemiological information. The expected result is Negative.  Fact Sheet for Patients:  BloggerCourse.com  Fact Sheet for Healthcare Providers:  SeriousBroker.it  This test is no t yet approved or cleared by the [place] FDA and  has been authorized for detection and/or diagnosis of SARS-CoV-2 by FDA under an Emergency Use Authorization (EUA). This EUA will remain  in effect (meaning this test can be used) for the duration of the COVID-19 declaration under Section 564(b)(1) of the Act, 21 U.S.C.section 360bbb-3(b)(1), unless the authorization is terminated  or revoked sooner.        Influenza A  by PCR NEGATIVE NEGATIVE Final   Influenza B by PCR NEGATIVE NEGATIVE Final    Comment: (NOTE) The Xpert Xpress SARS-CoV-2/FLU/RSV plus assay is intended as an aid in the diagnosis of influenza from Nasopharyngeal swab specimens and should not be used as a sole basis for treatment. Nasal washings and aspirates are unacceptable for Xpert Xpress SARS-CoV-2/FLU/RSV testing.  Fact Sheet for Patients: BloggerCourse.comhttps://www.fda.gov/media/152166/download  Fact Sheet for Healthcare Providers: SeriousBroker.ithttps://www.fda.gov/media/152162/download  This test is not yet approved or cleared by the [place] FDA and has been authorized for detection and/or diagnosis of SARS-CoV-2 by FDA under an Emergency Use Authorization (EUA). This EUA will remain in effect (meaning this test can be used) for the duration of the COVID-19 declaration under Section 564(b)(1) of the Act, 21 U.S.C. section 360bbb-3(b)(1), unless the authorization is terminated or revoked.  Performed at [facility], [address] [place], [place] [address]   MRSA Next Gen by PCR, Nasal     Status: None   Collection Time: 02/15/21  9:58 PM   Specimen: Nasal Mucosa; Nasal Swab  Result Value Ref Range Status   MRSA by PCR Next Gen NOT DETECTED NOT DETECTED Final    Comment: (NOTE) The GeneXpert MRSA Assay (FDA approved for NASAL specimens only), is one component of a comprehensive MRSA colonization surveillance program. It is not intended to diagnose MRSA infection nor to guide or monitor treatment for MRSA infections. Test performance is not FDA approved in patients less than 36 years old. Performed at [facility], [address] [place], [place] [address]   Culture, Respiratory w Gram Stain     Status: None   Collection Time: 02/17/21  2:04 PM   Specimen: Tracheal Aspirate; Respiratory  Result Value Ref Range Status   Specimen Description TRACHEAL ASPIRATE  Final   Special Requests NONE  Final   Gram Stain    Final    ABUNDANT WBC PRESENT,BOTH PMN AND MONONUCLEAR ABUNDANT GRAM NEGATIVE RODS MODERATE GRAM POSITIVE COCCI RARE GRAM POSITIVE RODS    Culture   Final    FEW Normal respiratory flora-no Staph aureus or Pseudomonas seen Performed at [facility], [address] [place], [place] [address]    Report Status 02/19/2021 FINAL  Final  Culture, blood (routine x 2)     Status: None   Collection Time: 02/17/21  2:24 PM   Specimen: BLOOD  Result Value Ref Range Status   Specimen Description BLOOD BLOOD RIGHT FOREARM  Final   Special Requests   Final    BOTTLES DRAWN AEROBIC AND ANAEROBIC Blood Culture adequate volume   Culture   Final    NO GROWTH 5 DAYS Performed at [facility], [address] [place], [place] [address]    Report Status 02/22/2021 FINAL  Final  Culture, blood (routine x 2)     Status: None   Collection Time: 02/17/21  3:01 PM   Specimen: BLOOD  Result Value Ref Range Status   Specimen Description BLOOD LEFT ANTECUBITAL  Final   Special Requests   Final    BOTTLES DRAWN AEROBIC AND ANAEROBIC Blood Culture adequate volume   Culture   Final    NO GROWTH 5 DAYS Performed at [facility], [address] [place], [place] [address]    Report Status 02/22/2021 FINAL  Final  Culture, Respiratory w Gram Stain     Status: None   Collection Time: 02/20/21 12:49 PM   Specimen: Tracheal Aspirate;  Respiratory  Result Value Ref Range Status   Specimen Description TRACHEAL ASPIRATE  Final   Special Requests NONE  Final   Gram Stain   Final    MODERATE WBC PRESENT,BOTH PMN AND MONONUCLEAR ABUNDANT GRAM POSITIVE COCCI IN PAIRS ABUNDANT GRAM NEGATIVE RODS Performed at [facility], [address] [place], [place] [address]    Culture   Final    ABUNDANT METHICILLIN RESISTANT STAPHYLOCOCCUS AUREUS ABUNDANT PSEUDOMONAS AERUGINOSA    Report Status 02/23/2021 FINAL  Final   Organism ID, Bacteria METHICILLIN RESISTANT STAPHYLOCOCCUS AUREUS  Final    Organism ID, Bacteria PSEUDOMONAS AERUGINOSA  Final      Susceptibility   Methicillin resistant staphylococcus aureus - MIC*    CIPROFLOXACIN <=0.5 SENSITIVE Sensitive     ERYTHROMYCIN <=0.25 SENSITIVE Sensitive     GENTAMICIN <=0.5 SENSITIVE Sensitive     OXACILLIN >=4 RESISTANT Resistant     TETRACYCLINE <=1 SENSITIVE Sensitive     VANCOMYCIN 1 SENSITIVE Sensitive     TRIMETH/SULFA <=10 SENSITIVE Sensitive     CLINDAMYCIN <=0.25 SENSITIVE Sensitive     RIFAMPIN <=0.5 SENSITIVE Sensitive     Inducible Clindamycin NEGATIVE Sensitive     * ABUNDANT METHICILLIN RESISTANT STAPHYLOCOCCUS AUREUS   Pseudomonas aeruginosa - MIC*    CEFTAZIDIME 4 SENSITIVE Sensitive     CIPROFLOXACIN 0.5 SENSITIVE Sensitive     GENTAMICIN <=1 SENSITIVE Sensitive     IMIPENEM 2 SENSITIVE Sensitive     PIP/TAZO 8 SENSITIVE Sensitive     CEFEPIME 2 SENSITIVE Sensitive     * ABUNDANT PSEUDOMONAS AERUGINOSA  Monkeypox Virus DNA, Qualitative Real-Time PCR     Status: None   Collection Time: 02/23/21  1:42 AM   Specimen: Arm, Right; Sterile Swab  Result Value Ref Range Status   Orthopoxvirus DNA, QL PCR NOT DETECTED NOT DETECTED Final    Comment: (NOTE) Non-variola Orthopoxvirus DNA not detected by real-time PCR. Performed At: [facility] [address] [place], [place] [id number] Jolene Schimke MD XB:[id number]   Culture, Respiratory w Gram Stain     Status: None   Collection Time: 02/23/21 10:05 AM   Specimen: Tracheal Aspirate; Respiratory  Result Value Ref Range Status   Specimen Description TRACHEAL ASPIRATE  Final   Special Requests NONE  Final   Gram Stain   Final    ABUNDANT WBC PRESENT, PREDOMINANTLY PMN FEW GRAM NEGATIVE RODS RARE GRAM POSITIVE COCCI Performed at [facility], [address] [place], [place] [address]    Culture   Final    MODERATE PSEUDOMONAS AERUGINOSA RARE METHICILLIN RESISTANT STAPHYLOCOCCUS AUREUS    Report Status 02/26/2021 FINAL  Final   Organism  ID, Bacteria PSEUDOMONAS AERUGINOSA  Final   Organism ID, Bacteria METHICILLIN RESISTANT STAPHYLOCOCCUS AUREUS  Final      Susceptibility   Methicillin resistant staphylococcus aureus - MIC*    CIPROFLOXACIN <=0.5 SENSITIVE Sensitive     ERYTHROMYCIN <=0.25 SENSITIVE Sensitive     GENTAMICIN <=0.5 SENSITIVE Sensitive     OXACILLIN >=4 RESISTANT Resistant     TETRACYCLINE <=1 SENSITIVE Sensitive     VANCOMYCIN <=0.5 SENSITIVE Sensitive     TRIMETH/SULFA <=10 SENSITIVE Sensitive     CLINDAMYCIN <=0.25 SENSITIVE Sensitive     RIFAMPIN <=0.5 SENSITIVE Sensitive     Inducible Clindamycin NEGATIVE Sensitive     * RARE METHICILLIN RESISTANT STAPHYLOCOCCUS AUREUS   Pseudomonas aeruginosa - MIC*    CEFTAZIDIME 4 SENSITIVE Sensitive     CIPROFLOXACIN <=0.25 SENSITIVE  Sensitive     GENTAMICIN <=1 SENSITIVE Sensitive     IMIPENEM 1 SENSITIVE Sensitive     PIP/TAZO 8 SENSITIVE Sensitive     CEFEPIME 2 SENSITIVE Sensitive     * MODERATE PSEUDOMONAS AERUGINOSA  Culture, blood (Routine X 2) w Reflex to ID Panel     Status: None   Collection Time: 02/24/21 10:53 AM   Specimen: BLOOD RIGHT HAND  Result Value Ref Range Status   Specimen Description BLOOD RIGHT HAND  Final   Special Requests   Final    BOTTLES DRAWN AEROBIC AND ANAEROBIC Blood Culture adequate volume   Culture   Final    NO GROWTH 5 DAYS Performed at [facility], [address], [place], [place] [address]    Report Status 03/01/2021 FINAL  Final  Culture, blood (Routine X 2) w Reflex to ID Panel     Status: None   Collection Time: 02/24/21 10:58 AM   Specimen: BLOOD LEFT HAND  Result Value Ref Range Status   Specimen Description BLOOD LEFT HAND  Final   Special Requests   Final    BOTTLES DRAWN AEROBIC AND ANAEROBIC Blood Culture adequate volume   Culture   Final    NO GROWTH 5 DAYS Performed at [facility], [address] [place], [place] [address]    Report Status 03/01/2021 FINAL  Final  SARS CORONAVIRUS  2 (TAT 6-24 HRS) Nasopharyngeal Nasopharyngeal Swab     Status: None   Collection Time: 02/25/21  9:20 AM   Specimen: Nasopharyngeal Swab  Result Value Ref Range Status   SARS Coronavirus 2 NEGATIVE NEGATIVE Final    Comment: (NOTE) SARS-CoV-2 target nucleic acids are NOT DETECTED.  The SARS-CoV-2 RNA is generally detectable in upper and lower respiratory specimens during the acute phase of infection. Negative results do not preclude SARS-CoV-2 infection, do not rule out co-infections with other pathogens, and should not be used as the sole basis for treatment or other patient management decisions. Negative results must be combined with clinical observations, patient history, and epidemiological information. The expected result is Negative.  Fact Sheet for Patients: HairSlick.no  Fact Sheet for Healthcare Providers: quierodirigir.com  This test is not yet approved or cleared by the [place] FDA and  has been authorized for detection and/or diagnosis of SARS-CoV-2 by FDA under an Emergency Use Authorization (EUA). This EUA will remain  in effect (meaning this test can be used) for the duration of the COVID-19 declaration under Se ction 564(b)(1) of the Act, 21 U.S.C. section 360bbb-3(b)(1), unless the authorization is terminated or revoked sooner.  Performed at [facility], [address] [place], [place] [address]   Culture, Respiratory w Gram Stain     Status: None   Collection Time: 03/04/21 10:37 AM   Specimen: Tracheal Aspirate; Respiratory  Result Value Ref Range Status   Specimen Description TRACHEAL ASPIRATE  Final   Special Requests NONE  Final   Gram Stain   Final    FEW SQUAMOUS EPITHELIAL CELLS PRESENT MODERATE WBC PRESENT, PREDOMINANTLY PMN MODERATE GRAM NEGATIVE RODS    Culture   Final    FEW PSEUDOMONAS AERUGINOSA NO STAPHYLOCOCCUS AUREUS ISOLATED Performed at [facility], [address] [place], [place] [address]    Report Status 03/07/2021 FINAL  Final   Organism ID, Bacteria PSEUDOMONAS AERUGINOSA  Final      Susceptibility   Pseudomonas aeruginosa - MIC*    CEFTAZIDIME 16 INTERMEDIATE  Intermediate     CIPROFLOXACIN <=0.25 SENSITIVE Sensitive     GENTAMICIN <=1 SENSITIVE Sensitive     IMIPENEM 2 SENSITIVE Sensitive     * FEW PSEUDOMONAS AERUGINOSA  Culture, Respiratory w Gram Stain     Status: None   Collection Time: 03/06/21  1:56 PM   Specimen: Tracheal Aspirate; Respiratory  Result Value Ref Range Status   Specimen Description TRACHEAL ASPIRATE  Final   Special Requests NONE  Final   Gram Stain   Final    MODERATE SQUAMOUS EPITHELIAL CELLS PRESENT MODERATE WBC PRESENT, PREDOMINANTLY PMN FEW GRAM NEGATIVE RODS FEW GRAM POSITIVE COCCI Performed at [facility], [address], [place], [place] [address]    Culture   Final    ABUNDANT PSEUDOMONAS AERUGINOSA ABUNDANT METHICILLIN RESISTANT STAPHYLOCOCCUS AUREUS    Report Status 03/09/2021 FINAL  Final   Organism ID, Bacteria PSEUDOMONAS AERUGINOSA  Final   Organism ID, Bacteria METHICILLIN RESISTANT STAPHYLOCOCCUS AUREUS  Final      Susceptibility   Methicillin resistant staphylococcus aureus - MIC*    CIPROFLOXACIN <=0.5 SENSITIVE Sensitive     ERYTHROMYCIN <=0.25 SENSITIVE Sensitive     GENTAMICIN <=0.5 SENSITIVE Sensitive     OXACILLIN >=4 RESISTANT Resistant     TETRACYCLINE <=1 SENSITIVE Sensitive     VANCOMYCIN <=0.5 SENSITIVE Sensitive     TRIMETH/SULFA <=10 SENSITIVE Sensitive     CLINDAMYCIN <=0.25 SENSITIVE Sensitive     RIFAMPIN <=0.5 SENSITIVE Sensitive     Inducible Clindamycin NEGATIVE Sensitive     * ABUNDANT METHICILLIN RESISTANT STAPHYLOCOCCUS AUREUS   Pseudomonas aeruginosa - MIC*    CEFTAZIDIME 2 SENSITIVE Sensitive     CIPROFLOXACIN <=0.25 SENSITIVE Sensitive     GENTAMICIN <=1 SENSITIVE Sensitive     IMIPENEM 0.5 SENSITIVE Sensitive     PIP/TAZO <=4 SENSITIVE  Sensitive     CEFEPIME 2 SENSITIVE Sensitive     * ABUNDANT PSEUDOMONAS AERUGINOSA  Aerobic Culture w Gram Stain (superficial specimen)     Status: None   Collection Time: 03/06/21  3:13 PM   Specimen: Wound  Result Value Ref Range Status   Specimen Description WOUND  Final   Special Requests EAR RIGHT  Final   Gram Stain   Final    ABUNDANT WBC PRESENT,BOTH PMN AND MONONUCLEAR RARE SQUAMOUS EPITHELIAL CELLS PRESENT ABUNDANT GRAM NEGATIVE RODS FEW GRAM VARIABLE ROD FEW GRAM POSITIVE COCCI FEW YEAST Performed at [facility], [address] [place] [address], [place], [place] [address]    Culture   Final    MODERATE ENTEROBACTER CLOACAE FEW KLEBSIELLA PNEUMONIAE    Report Status 03/09/2021 FINAL  Final   Organism ID, Bacteria ENTEROBACTER CLOACAE  Final   Organism ID, Bacteria KLEBSIELLA PNEUMONIAE  Final      Susceptibility   Enterobacter cloacae - MIC*    CEFAZOLIN >=64 RESISTANT Resistant     CEFEPIME <=0.12 SENSITIVE Sensitive     CEFTAZIDIME <=1 SENSITIVE Sensitive     CIPROFLOXACIN <=0.25 SENSITIVE Sensitive     GENTAMICIN <=1 SENSITIVE Sensitive     IMIPENEM <=0.25 SENSITIVE Sensitive     TRIMETH/SULFA <=20 SENSITIVE Sensitive     PIP/TAZO <=4 SENSITIVE Sensitive     * MODERATE ENTEROBACTER CLOACAE   Klebsiella pneumoniae - MIC*    AMPICILLIN >=32 RESISTANT Resistant     CEFAZOLIN <=4 SENSITIVE Sensitive     CEFEPIME <=0.12 SENSITIVE Sensitive     CEFTAZIDIME <=1 SENSITIVE Sensitive     CEFTRIAXONE <=0.25 SENSITIVE Sensitive  CIPROFLOXACIN <=0.25 SENSITIVE Sensitive     GENTAMICIN <=1 SENSITIVE Sensitive     IMIPENEM <=0.25 SENSITIVE Sensitive     TRIMETH/SULFA <=20 SENSITIVE Sensitive     AMPICILLIN/SULBACTAM 4 SENSITIVE Sensitive     PIP/TAZO <=4 SENSITIVE Sensitive     * FEW KLEBSIELLA PNEUMONIAE  Culture, Respiratory w Gram Stain     Status: None   Collection Time: 03/09/21 10:46 AM   Specimen: Tracheal Aspirate; Respiratory  Result Value Ref Range Status    Specimen Description TRACHEAL ASPIRATE  Final   Special Requests NONE  Final   Gram Stain   Final    FEW WBC PRESENT, PREDOMINANTLY PMN FEW GRAM NEGATIVE RODS Performed at [facility], [address] [place], [place] [address]    Culture ABUNDANT PSEUDOMONAS AERUGINOSA  Final   Report Status 03/15/2021 FINAL  Final   Organism ID, Bacteria PSEUDOMONAS AERUGINOSA  Final      Susceptibility   Pseudomonas aeruginosa - MIC*    CEFTAZIDIME 4 SENSITIVE Sensitive     CIPROFLOXACIN <=0.25 SENSITIVE Sensitive     GENTAMICIN <=1 SENSITIVE Sensitive     IMIPENEM 2 SENSITIVE Sensitive     PIP/TAZO 8 SENSITIVE Sensitive     CEFEPIME 2 SENSITIVE Sensitive     * ABUNDANT PSEUDOMONAS AERUGINOSA  Surgical pcr screen     Status: Abnormal   Collection Time: 03/11/21  7:26 AM   Specimen: Nasal Mucosa; Nasal Swab  Result Value Ref Range Status   MRSA, PCR POSITIVE (A) NEGATIVE Final    Comment: RESULT CALLED TO, READ BACK BY AND VERIFIED WITH: RN P.TRAVIS AT 0915 ON 03/11/2021 BY T.SAAD.    Staphylococcus aureus POSITIVE (A) NEGATIVE Final    Comment: (NOTE) The Xpert SA Assay (FDA approved for NASAL specimens in patients 70 years of age and older), is one component of a comprehensive surveillance program. It is not intended to diagnose infection nor to guide or monitor treatment. Performed at [facility], [address], [place], [place] [address]   Culture, blood (Routine X 2) w Reflex to ID Panel     Status: None (Preliminary result)   Collection Time: 03/13/21 11:15 AM   Specimen: BLOOD  Result Value Ref Range Status   Specimen Description BLOOD LEFT ANTECUBITAL  Final   Special Requests AEROBIC BOTTLE ONLY Blood Culture adequate volume  Final   Culture   Final    NO GROWTH 4 DAYS Performed at [facility], [address] [place], [place] [address]    Report Status PENDING  Incomplete  Culture, blood (Routine X 2) w Reflex to ID Panel     Status: None (Preliminary  result)   Collection Time: 03/13/21 11:15 AM   Specimen: BLOOD  Result Value Ref Range Status   Specimen Description BLOOD BLOOD LEFT FOREARM  Final   Special Requests AEROBIC BOTTLE ONLY Blood Culture adequate volume  Final   Culture   Final    NO GROWTH 4 DAYS Performed at [facility], [address] [place], [place] [address]    Report Status PENDING  Incomplete  Culture, Respiratory w Gram Stain     Status: None   Collection Time: 03/15/21 10:18 AM   Specimen: Tracheal Aspirate; Respiratory  Result Value Ref Range Status   Specimen Description TRACHEAL ASPIRATE  Final   Special Requests NONE  Final   Gram Stain   Final    ABUNDANT WBC PRESENT, PREDOMINANTLY PMN NO ORGANISMS SEEN  Performed at [facility], [address] [place], [place] [address]    Culture   Final    MODERATE PSEUDOMONAS AERUGINOSA MODERATE ACINETOBACTER CALCOACETICUS/BAUMANNII COMPLEX    Report Status 03/18/2021 FINAL  Final   Organism ID, Bacteria PSEUDOMONAS AERUGINOSA  Final   Organism ID, Bacteria ACINETOBACTER CALCOACETICUS/BAUMANNII COMPLEX  Final      Susceptibility   Acinetobacter calcoaceticus/baumannii complex - MIC*    CEFTAZIDIME 4 SENSITIVE Sensitive     CIPROFLOXACIN <=0.25 SENSITIVE Sensitive     GENTAMICIN <=1 SENSITIVE Sensitive     IMIPENEM <=0.25 SENSITIVE Sensitive     PIP/TAZO <=4 SENSITIVE Sensitive     TRIMETH/SULFA <=20 SENSITIVE Sensitive     AMPICILLIN/SULBACTAM <=2 SENSITIVE Sensitive     * MODERATE ACINETOBACTER CALCOACETICUS/BAUMANNII COMPLEX   Pseudomonas aeruginosa - MIC*    CEFTAZIDIME 4 SENSITIVE Sensitive     CIPROFLOXACIN <=0.25 SENSITIVE Sensitive     GENTAMICIN <=1 SENSITIVE Sensitive     IMIPENEM 2 SENSITIVE Sensitive     PIP/TAZO <=4 SENSITIVE Sensitive     CEFEPIME 4 SENSITIVE Sensitive     * MODERATE PSEUDOMONAS AERUGINOSA    Anti-infectives:  Anti-infectives (From admission, onward)    Start     Dose/Rate Route Frequency Ordered Stop    03/15/21 0400  vancomycin (VANCOCIN) IVPB 1000 mg/200 mL premix  Status:  Discontinued        1,000 mg 200 mL/hr over 60 Minutes Intravenous Every 8 hours 03/15/21 0125 03/17/21 0928   03/13/21 1200  ceFEPIme (MAXIPIME) 2 g in sodium chloride 0.9 % 100 mL IVPB        2 g 200 mL/hr over 30 Minutes Intravenous Every 8 hours 03/13/21 1103 03/20/21 1159   03/12/21 2200  vancomycin (VANCOREADY) IVPB 750 mg/150 mL  Status:  Discontinued        750 mg 150 mL/hr over 60 Minutes Intravenous Every 8 hours 03/12/21 1537 03/15/21 0125   03/12/21 0530  vancomycin (VANCOREADY) IVPB 750 mg/150 mL  Status:  Discontinued        750 mg 150 mL/hr over 60 Minutes Intravenous Every 8 hours 03/11/21 2145 03/12/21 1340   03/10/21 1400  piperacillin-tazobactam (ZOSYN) IVPB 3.375 g  Status:  Discontinued        3.375 g 12.5 mL/hr over 240 Minutes Intravenous Every 8 hours 03/10/21 0909 03/12/21 1340   03/10/21 1000  vancomycin (VANCOCIN) IVPB 1000 mg/200 mL premix  Status:  Discontinued        1,000 mg 200 mL/hr over 60 Minutes Intravenous Every 8 hours 03/10/21 0908 03/11/21 2145   03/06/21 1415  piperacillin-tazobactam (ZOSYN) IVPB 3.375 g        3.375 g 12.5 mL/hr over 240 Minutes Intravenous Every 8 hours 03/06/21 1324 03/10/21 0902   02/24/21 1200  vancomycin (VANCOCIN) IVPB 1000 mg/200 mL premix        1,000 mg 200 mL/hr over 60 Minutes Intravenous Every 8 hours 02/24/21 0934 03/02/21 2020   02/23/21 0200  vancomycin (VANCOREADY) IVPB 1500 mg/300 mL  Status:  Discontinued        1,500 mg 150 mL/hr over 120 Minutes Intravenous Every 8 hours 02/22/21 1931 02/24/21 0934   02/22/21 2100  vancomycin (VANCOCIN) 250 mg in sodium chloride 0.9 % 100 mL IVPB        250 mg 100 mL/hr over 60 Minutes Intravenous  Once 02/22/21 2012 02/22/21 2150   02/22/21 2030  vancomycin (VANCOCIN) 250 mg in sodium chloride 0.9 % 500  mL IVPB  Status:  Discontinued        250 mg 250 mL/hr over 120 Minutes Intravenous  Once 02/22/21  1931 02/22/21 2012   02/21/21 1800  vancomycin (VANCOREADY) IVPB 1250 mg/250 mL  Status:  Discontinued        1,250 mg 166.7 mL/hr over 90 Minutes Intravenous Every 8 hours 02/21/21 0851 02/22/21 1931   02/21/21 1000  ceFEPIme (MAXIPIME) 2 g in sodium chloride 0.9 % 100 mL IVPB        2 g 200 mL/hr over 30 Minutes Intravenous Every 8 hours 02/21/21 0851 03/02/21 1749   02/21/21 1000  vancomycin (VANCOREADY) IVPB 2000 mg/400 mL        2,000 mg 200 mL/hr over 120 Minutes Intravenous  Once 02/21/21 0851 02/21/21 1306       Best Practice/Protocols:  VTE Prophylaxis: Lovenox (full dose) Continous Sedation  Consults: Treatment Team:  Serena Colonel, MD Myrene Galas, MD    Studies:    Events:  Subjective:    Overnight Issues:   Objective:  Vital signs for last 24 hours: Temp:  [98.1 F (36.7 C)-98.8 F (37.1 C)] 98.1 F (36.7 C) (09/09 0800) Pulse Rate:  [56-111] 63 (09/09 0803) Resp:  [16-29] 23 (09/09 0803) BP: (90-148)/(65-114) 126/97 (09/09 0803) SpO2:  [91 %-100 %] 100 % (09/09 0803) FiO2 (%):  [40 %] 40 % (09/09 0803) Weight:  [68 kg] 68 kg (09/09 0500)  Hemodynamic parameters for last 24 hours:    Intake/Output from previous day: 09/08 0701 - 09/09 0700 In: 2446 [I.V.:232.1; NG/GT:1913.8; IV Piggyback:300] Out: 2155 [Urine:2010; Stool:145]  Intake/Output this shift: Total I/O In: 171 [I.V.:31; NG/GT:140] Out: -   Vent settings for last 24 hours: Vent Mode: PSV;CPAP FiO2 (%):  [40 %] 40 % Set Rate:  [16 bmp] 16 bmp Vt Set:  [161 mL] 620 mL PEEP:  [5 cmH20] 5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

## 2021-03-19 LAB — CBC WITH DIFFERENTIAL/PLATELET
Abs Immature Granulocytes: 0.03 10*3/uL (ref 0.00–0.07)
Basophils Absolute: 0 10*3/uL (ref 0.0–0.1)
Basophils Relative: 1 %
Eosinophils Absolute: 0.3 10*3/uL (ref 0.0–0.5)
Eosinophils Relative: 3 %
HCT: 30.1 % — ABNORMAL LOW (ref 39.0–52.0)
Hemoglobin: 9.4 g/dL — ABNORMAL LOW (ref 13.0–17.0)
Immature Granulocytes: 0 %
Lymphocytes Relative: 20 %
Lymphs Abs: 1.8 10*3/uL (ref 0.7–4.0)
MCH: 31 pg (ref 26.0–34.0)
MCHC: 31.2 g/dL (ref 30.0–36.0)
MCV: 99.3 fL (ref 80.0–100.0)
Monocytes Absolute: 0.6 10*3/uL (ref 0.1–1.0)
Monocytes Relative: 7 %
Neutro Abs: 6.1 10*3/uL (ref 1.7–7.7)
Neutrophils Relative %: 69 %
Platelets: 370 10*3/uL (ref 150–400)
RBC: 3.03 MIL/uL — ABNORMAL LOW (ref 4.22–5.81)
RDW: 17.2 % — ABNORMAL HIGH (ref 11.5–15.5)
WBC: 8.8 10*3/uL (ref 4.0–10.5)
nRBC: 0 % (ref 0.0–0.2)

## 2021-03-19 LAB — GLUCOSE, CAPILLARY
Glucose-Capillary: 112 mg/dL — ABNORMAL HIGH (ref 70–99)
Glucose-Capillary: 104 mg/dL — ABNORMAL HIGH (ref 70–99)
Glucose-Capillary: 88 mg/dL (ref 70–99)
Glucose-Capillary: 118 mg/dL — ABNORMAL HIGH (ref 70–99)
Glucose-Capillary: 118 mg/dL — ABNORMAL HIGH (ref 70–99)
Glucose-Capillary: 122 mg/dL — ABNORMAL HIGH (ref 70–99)

## 2021-03-19 NOTE — Progress Notes (Signed)
Pt placed on 40% ATC per wean protocol. Pt is tolerating well at this time. RT to continue to monitor. 
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
## 2021-03-19 NOTE — Progress Notes (Addendum)
Pt placed back on full vent support due to increased WOB with sats in the 70's. Pt given 100% FIO2 breath and suctioned for large mucous plug. Sats increased to 95%. Pt tolerated 8 hrs of ATC.

## 2021-03-20 LAB — CBC WITH DIFFERENTIAL/PLATELET
Abs Immature Granulocytes: 0.04 10*3/uL (ref 0.00–0.07)
Basophils Absolute: 0.1 10*3/uL (ref 0.0–0.1)
Basophils Relative: 1 %
Eosinophils Absolute: 0.2 10*3/uL (ref 0.0–0.5)
Eosinophils Relative: 3 %
HCT: 30.9 % — ABNORMAL LOW (ref 39.0–52.0)
Hemoglobin: 9.5 g/dL — ABNORMAL LOW (ref 13.0–17.0)
Immature Granulocytes: 1 %
Lymphocytes Relative: 27 %
Lymphs Abs: 2.1 10*3/uL (ref 0.7–4.0)
MCH: 30.7 pg (ref 26.0–34.0)
MCHC: 30.7 g/dL (ref 30.0–36.0)
MCV: 100 fL (ref 80.0–100.0)
Monocytes Absolute: 0.6 10*3/uL (ref 0.1–1.0)
Monocytes Relative: 8 %
Neutro Abs: 4.8 10*3/uL (ref 1.7–7.7)
Neutrophils Relative %: 60 %
Platelets: 345 10*3/uL (ref 150–400)
RBC: 3.09 MIL/uL — ABNORMAL LOW (ref 4.22–5.81)
RDW: 17.2 % — ABNORMAL HIGH (ref 11.5–15.5)
WBC: 7.8 10*3/uL (ref 4.0–10.5)
nRBC: 0 % (ref 0.0–0.2)

## 2021-03-20 LAB — GLUCOSE, CAPILLARY
Glucose-Capillary: 95 mg/dL (ref 70–99)
Glucose-Capillary: 126 mg/dL — ABNORMAL HIGH (ref 70–99)
Glucose-Capillary: 98 mg/dL (ref 70–99)
Glucose-Capillary: 104 mg/dL — ABNORMAL HIGH (ref 70–99)
Glucose-Capillary: 98 mg/dL (ref 70–99)
Glucose-Capillary: 105 mg/dL — ABNORMAL HIGH (ref 70–99)

## 2021-03-20 NOTE — Progress Notes (Signed)
Pt placed on 40% ATC per wean protocol. Pt is tolerating well at this time. RN aware. RT to continue to monitor.

## 2021-03-20 NOTE — Progress Notes (Signed)
Follow up - Trauma and Critical Care  Patient Details:    Scott [name] is an 35 y.o. male.  Anti-infectives:  Anti-infectives (From admission, onward)    Start     Dose/Rate Route Frequency Ordered Stop   03/15/21 0400  vancomycin (VANCOCIN) IVPB 1000 mg/200 mL premix  Status:  Discontinued        1,000 mg 200 mL/hr over 60 Minutes Intravenous Every 8 hours 03/15/21 0125 03/17/21 0928   03/13/21 1200  ceFEPIme (MAXIPIME) 2 g in sodium chloride 0.9 % 100 mL IVPB        2 g 200 mL/hr over 30 Minutes Intravenous Every 8 hours 03/13/21 1103 03/20/21 0429   03/12/21 2200  vancomycin (VANCOREADY) IVPB 750 mg/150 mL  Status:  Discontinued        750 mg 150 mL/hr over 60 Minutes Intravenous Every 8 hours 03/12/21 1537 03/15/21 0125   03/12/21 0530  vancomycin (VANCOREADY) IVPB 750 mg/150 mL  Status:  Discontinued        750 mg 150 mL/hr over 60 Minutes Intravenous Every 8 hours 03/11/21 2145 03/12/21 1340   03/10/21 1400  piperacillin-tazobactam (ZOSYN) IVPB 3.375 g  Status:  Discontinued        3.375 g 12.5 mL/hr over 240 Minutes Intravenous Every 8 hours 03/10/21 0909 03/12/21 1340   03/10/21 1000  vancomycin (VANCOCIN) IVPB 1000 mg/200 mL premix  Status:  Discontinued        1,000 mg 200 mL/hr over 60 Minutes Intravenous Every 8 hours 03/10/21 0908 03/11/21 2145   03/06/21 1415  piperacillin-tazobactam (ZOSYN) IVPB 3.375 g        3.375 g 12.5 mL/hr over 240 Minutes Intravenous Every 8 hours 03/06/21 1324 03/10/21 0902   02/24/21 1200  vancomycin (VANCOCIN) IVPB 1000 mg/200 mL premix        1,000 mg 200 mL/hr over 60 Minutes Intravenous Every 8 hours 02/24/21 0934 03/02/21 2020   02/23/21 0200  vancomycin (VANCOREADY) IVPB 1500 mg/300 mL  Status:  Discontinued        1,500 mg 150 mL/hr over 120 Minutes Intravenous Every 8 hours 02/22/21 1931 02/24/21 0934   02/22/21 2100  vancomycin (VANCOCIN) 250 mg in sodium chloride 0.9 % 100 mL IVPB        250 mg 100 mL/hr over 60 Minutes  Intravenous  Once 02/22/21 2012 02/22/21 2150   02/22/21 2030  vancomycin (VANCOCIN) 250 mg in sodium chloride 0.9 % 500 mL IVPB  Status:  Discontinued        250 mg 250 mL/hr over 120 Minutes Intravenous  Once 02/22/21 1931 02/22/21 2012   02/21/21 1800  vancomycin (VANCOREADY) IVPB 1250 mg/250 mL  Status:  Discontinued        1,250 mg 166.7 mL/hr over 90 Minutes Intravenous Every 8 hours 02/21/21 0851 02/22/21 1931   02/21/21 1000  ceFEPIme (MAXIPIME) 2 g in sodium chloride 0.9 % 100 mL IVPB        2 g 200 mL/hr over 30 Minutes Intravenous Every 8 hours 02/21/21 0851 03/02/21 1749   02/21/21 1000  vancomycin (VANCOREADY) IVPB 2000 mg/400 mL        2,000 mg 200 mL/hr over 120 Minutes Intravenous  Once 02/21/21 0352 02/21/21 1306       Consults: Treatment Team:  Serena Colonel, MD Myrene Galas, MD   Chief Complaint/Subjective:    Overnight Issues: No acute changes  Objective:  Vital signs for last 24 hours: Temp:  [98.1 F (36.7  C)-99 F (37.2 C)] 98.5 F (36.9 C) (09/11 0800) Pulse Rate:  [71-107] 78 (09/11 0801) Resp:  [14-47] 17 (09/11 0801) BP: (88-134)/(57-97) 124/83 (09/11 0800) SpO2:  [92 %-99 %] 99 % (09/11 0801) FiO2 (%):  [40 %] 40 % (09/11 0801) Weight:  [68 kg] 68 kg (09/11 0413)  Hemodynamic parameters for last 24 hours:    Intake/Output from previous day: 09/10 0701 - 09/11 0700 In: 2973.2 [I.V.:613; NG/GT:2020; IV Piggyback:340.2] Out: 1850 [Urine:1850]  Intake/Output this shift: Total I/O In: 189.7 [I.V.:49.7; NG/GT:140] Out: -   Vent settings for last 24 hours: Vent Mode: PRVC FiO2 (%):  [40 %] 40 % Set Rate:  [16 bmp] 16 bmp Vt Set:  [073 mL] 620 mL PEEP:  [5 cmH20] 5 cmH20 Plateau Pressure:  [17 cmH20-19 cmH20] 19 cmH20  Physical Exam:  Gen: NAE HEENT: trach in position Resp: trach collar, nonlabored Cardiovascular: RRR Abdomen: soft, NT, ND Ext: no edema Neuro: moves all extremities, follows commands at times  Assessment/Plan:    SAH, 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 Conchita Paris, keppra x7d for sz ppx, repeat 4V Angio CT of neck 8/21 negative. 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  G1 L ICA injury - ASA 325mg  discontinued when starting eliquis for DVT (9/9) Facial laceration involving left eyelid - ENT c/s, Dr. 07-10-1989, repaired 8/9 Skull fx extending ito L TMJ, L sphenoid sinus, R mastoid; R nasal bone fx - ENT c/s, Dr. 10/9, repeat exam when more neurologically appropriate R calf DVT - eliquis Acute hypoxic ventilator dependent respiratory failure -  Tolerating TCT x8 hours. Continue TCT as tolerated ID/PNA - 9/6 resp CX Pseud/Acinetobacter - Plan cefepime until 9/11. Ciprodex drops for otitis externa per Dr. 11/11. CV/Hypertensive urgency - off esmolol,  metoprolol to 150 q8h, labetalol prn L clavicle and L scapula fx - non-op per Dr. Pollyann Kennedy [address] rash, scattered abrasions - local wound care Alcohol abuse - CIWA H/o tobacco use disorder  FEN - g tube, goal TF, free water for hypernatremia (improving), low dose imodium for diarrhea. continue seroquel to aid weaning DVT - SCDs, R calf DVT - eliquis Foley - out, condom cath, D/Cd urecholine Dispo - ICU   LOS: 33 days   Critical Care Total Time*: 35 minutes  [name] Aeisha Minarik 03/20/2021  *Care during the described time interval was provided by me and/or other providers on the critical care team.  I have reviewed this patient's available data, including medical history, events of note, physical examination and test results as part of my evaluation.

## 2021-03-20 NOTE — Progress Notes (Signed)
Pt continues on ATC tolerating well.  Patient will remain on ATC throughout the night as long as he can tolerate it.  RT will continue to monitor and assess.

## 2021-03-21 ENCOUNTER — Inpatient Hospital Stay (HOSPITAL_COMMUNITY): Payer: 59

## 2021-03-21 LAB — CBC WITH DIFFERENTIAL/PLATELET
Abs Immature Granulocytes: 0.05 10*3/uL (ref 0.00–0.07)
Basophils Absolute: 0 10*3/uL (ref 0.0–0.1)
Basophils Relative: 1 %
Eosinophils Absolute: 0.3 10*3/uL (ref 0.0–0.5)
Eosinophils Relative: 5 %
HCT: 33.6 % — ABNORMAL LOW (ref 39.0–52.0)
Hemoglobin: 10.7 g/dL — ABNORMAL LOW (ref 13.0–17.0)
Immature Granulocytes: 1 %
Lymphocytes Relative: 29 %
Lymphs Abs: 2.2 10*3/uL (ref 0.7–4.0)
MCH: 31 pg (ref 26.0–34.0)
MCHC: 31.8 g/dL (ref 30.0–36.0)
MCV: 97.4 fL (ref 80.0–100.0)
Monocytes Absolute: 0.5 10*3/uL (ref 0.1–1.0)
Monocytes Relative: 7 %
Neutro Abs: 4.3 10*3/uL (ref 1.7–7.7)
Neutrophils Relative %: 57 %
Platelets: 325 10*3/uL (ref 150–400)
RBC: 3.45 MIL/uL — ABNORMAL LOW (ref 4.22–5.81)
RDW: 16.4 % — ABNORMAL HIGH (ref 11.5–15.5)
WBC: 7.4 10*3/uL (ref 4.0–10.5)
nRBC: 0 % (ref 0.0–0.2)

## 2021-03-21 LAB — GLUCOSE, CAPILLARY
Glucose-Capillary: 111 mg/dL — ABNORMAL HIGH (ref 70–99)
Glucose-Capillary: 96 mg/dL (ref 70–99)
Glucose-Capillary: 88 mg/dL (ref 70–99)
Glucose-Capillary: 97 mg/dL (ref 70–99)
Glucose-Capillary: 97 mg/dL (ref 70–99)
Glucose-Capillary: 109 mg/dL — ABNORMAL HIGH (ref 70–99)

## 2021-03-21 MED ORDER — HALOPERIDOL LACTATE 5 MG/ML IJ SOLN
10.0000 mg | Freq: Four times a day (QID) | INTRAMUSCULAR | Status: DC | PRN
  Administered 2021-03-21: 10 mg via INTRAVENOUS
  Filled 2021-03-21: qty 2

## 2021-03-21 MED ORDER — FENTANYL CITRATE PF 50 MCG/ML IJ SOSY
50.0000 ug | PREFILLED_SYRINGE | INTRAMUSCULAR | Status: DC | PRN
  Administered 2021-03-21 – 2021-03-25 (×3): 50 ug via INTRAVENOUS
  Filled 2021-03-21 (×4): qty 1

## 2021-03-21 MED ORDER — DIATRIZOATE MEGLUMINE & SODIUM 66-10 % PO SOLN
ORAL | Status: AC
  Administered 2021-03-21: 30 mL
  Administered 2021-03-22: 30 mL via GASTROSTOMY
  Filled 2021-03-21: qty 30
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
## 2021-03-21 NOTE — Progress Notes (Signed)
Patient ID: Scott [name], male   DOB: 09-14-1985, 35 y.o.   MRN: [id number] Follow up - Trauma Critical Care  Patient Details:    Scott [name] is an 35 y.o. male.  Lines/tubes : PICC Triple Lumen 02/20/21 PICC Right Basilic 39 cm 0 cm (Active)  Indication for Insertion or Continuance of Line Prolonged intravenous therapies 03/19/21 0800  Exposed Catheter (cm) 1 cm 03/07/21 1200  Site Assessment Clean;Dry;Intact 03/20/21 2000  Lumen #1 Status In-line blood sampling system in place;Blood return noted 03/20/21 2000  Lumen #2 Status Infusing 03/20/21 2000  Lumen #3 Status Infusing 03/20/21 2000  Dressing Type Transparent;Securing device 03/20/21 2000  Dressing Status Clean;Dry;Intact 03/20/21 2000  Antimicrobial disc in place? Yes 03/20/21 2000  Safety Lock Not Applicable 03/20/21 2000  Line Care Connections checked and tightened 03/20/21 2000  Line Adjustment (NICU/IV Team Only) No 03/12/21 0800  Dressing Intervention New dressing;Dressing reinforced 03/20/21 0800  Dressing Change Due 03/26/21 03/20/21 2000     Gastrostomy/Enterostomy Percutaneous endoscopic gastrostomy (PEG) 24 Fr. LUQ (Active)  Surrounding Skin Dry;Intact 03/20/21 2000  Tube Status Patent 03/20/21 2000  Drainage Appearance Serosanguineous 03/19/21 1900  Dressing Status Clean;Dry;Intact 03/20/21 2000  Dressing Intervention Dressing reinforced 03/20/21 2000  Dressing Type Dry dressing;Split gauze 03/20/21 2000     Flatus Tube/Pouch (Active)  Daily care Skin around tube assessed 03/20/21 2000  Output (mL) 200 mL 03/20/21 1800  Intake (mL) 30 mL 03/08/21 2200     External Urinary Catheter (Active)  Collection Container Standard drainage bag 03/20/21 2000  Suction (Verified suction is between 40-80 mmHg) N/A (Patient has condom catheter) 03/20/21 2000  Securement Method Securing device (Describe) 03/20/21 2000  Site Assessment Clean;Intact 03/20/21 2000  Intervention Male External Urinary Catheter Replaced  03/18/21 0800  Output (mL) 150 mL 03/21/21 0600    Microbiology/Sepsis markers: Results for orders placed or performed during the hospital encounter of 02/15/21  Resp Panel by RT-PCR (Flu A&B, Covid) Nasopharyngeal Swab     Status: None   Collection Time: 02/15/21  8:14 PM   Specimen: Nasopharyngeal Swab; Nasopharyngeal(NP) swabs in vial transport medium  Result Value Ref Range Status   SARS Coronavirus 2 by RT PCR NEGATIVE NEGATIVE Final    Comment: (NOTE) SARS-CoV-2 target nucleic acids are NOT DETECTED.  The SARS-CoV-2 RNA is generally detectable in upper respiratory specimens during the acute phase of infection. The lowest concentration of SARS-CoV-2 viral copies this assay can detect is 138 copies/mL. A negative result does not preclude SARS-Cov-2 infection and should not be used as the sole basis for treatment or other patient management decisions. A negative result may occur with  improper specimen collection/handling, submission of specimen other than nasopharyngeal swab, presence of viral mutation(s) within the areas targeted by this assay, and inadequate number of viral copies(<138 copies/mL). A negative result must be combined with clinical observations, patient history, and epidemiological information. The expected result is Negative.  Fact Sheet for Patients:  BloggerCourse.com  Fact Sheet for Healthcare Providers:  SeriousBroker.it  This test is no t yet approved or cleared by the [place] FDA and  has been authorized for detection and/or diagnosis of SARS-CoV-2 by FDA under an Emergency Use Authorization (EUA). This EUA will remain  in effect (meaning this test can be used) for the duration of the COVID-19 declaration under Section 564(b)(1) of the Act, 21 U.S.C.section 360bbb-3(b)(1), unless the authorization is terminated  or revoked sooner.       Influenza A by PCR NEGATIVE NEGATIVE  Final   Influenza B  by PCR NEGATIVE NEGATIVE Final    Comment: (NOTE) The Xpert Xpress SARS-CoV-2/FLU/RSV plus assay is intended as an aid in the diagnosis of influenza from Nasopharyngeal swab specimens and should not be used as a sole basis for treatment. Nasal washings and aspirates are unacceptable for Xpert Xpress SARS-CoV-2/FLU/RSV testing.  Fact Sheet for Patients: BloggerCourse.com  Fact Sheet for Healthcare Providers: SeriousBroker.it  This test is not yet approved or cleared by the [place] FDA and has been authorized for detection and/or diagnosis of SARS-CoV-2 by FDA under an Emergency Use Authorization (EUA). This EUA will remain in effect (meaning this test can be used) for the duration of the COVID-19 declaration under Section 564(b)(1) of the Act, 21 U.S.C. section 360bbb-3(b)(1), unless the authorization is terminated or revoked.  Performed at [facility], [address] [place], [place] [address]   MRSA Next Gen by PCR, Nasal     Status: None   Collection Time: 02/15/21  9:58 PM   Specimen: Nasal Mucosa; Nasal Swab  Result Value Ref Range Status   MRSA by PCR Next Gen NOT DETECTED NOT DETECTED Final    Comment: (NOTE) The GeneXpert MRSA Assay (FDA approved for NASAL specimens only), is one component of a comprehensive MRSA colonization surveillance program. It is not intended to diagnose MRSA infection nor to guide or monitor treatment for MRSA infections. Test performance is not FDA approved in patients less than 21 years old. Performed at [facility], [address] [place], [place] [address]   Culture, Respiratory w Gram Stain     Status: None   Collection Time: 02/17/21  2:04 PM   Specimen: Tracheal Aspirate; Respiratory  Result Value Ref Range Status   Specimen Description TRACHEAL ASPIRATE  Final   Special Requests NONE  Final   Gram Stain   Final    ABUNDANT WBC PRESENT,BOTH PMN AND  MONONUCLEAR ABUNDANT GRAM NEGATIVE RODS MODERATE GRAM POSITIVE COCCI RARE GRAM POSITIVE RODS    Culture   Final    FEW Normal respiratory flora-no Staph aureus or Pseudomonas seen Performed at [facility], [address] [place], [place] [address]    Report Status 02/19/2021 FINAL  Final  Culture, blood (routine x 2)     Status: None   Collection Time: 02/17/21  2:24 PM   Specimen: BLOOD  Result Value Ref Range Status   Specimen Description BLOOD BLOOD RIGHT FOREARM  Final   Special Requests   Final    BOTTLES DRAWN AEROBIC AND ANAEROBIC Blood Culture adequate volume   Culture   Final    NO GROWTH 5 DAYS Performed at [facility], [address], [place], [place] [address]    Report Status 02/22/2021 FINAL  Final  Culture, blood (routine x 2)     Status: None   Collection Time: 02/17/21  3:01 PM   Specimen: BLOOD  Result Value Ref Range Status   Specimen Description BLOOD LEFT ANTECUBITAL  Final   Special Requests   Final    BOTTLES DRAWN AEROBIC AND ANAEROBIC Blood Culture adequate volume   Culture   Final    NO GROWTH 5 DAYS Performed at [facility], [address] [place], [place] [address]    Report Status 02/22/2021 FINAL  Final  Culture, Respiratory w Gram Stain     Status: None   Collection Time: 02/20/21 12:49 PM   Specimen: Tracheal Aspirate; Respiratory  Result  Value Ref Range Status   Specimen Description TRACHEAL ASPIRATE  Final   Special Requests NONE  Final   Gram Stain   Final    MODERATE WBC PRESENT,BOTH PMN AND MONONUCLEAR ABUNDANT GRAM POSITIVE COCCI IN PAIRS ABUNDANT GRAM NEGATIVE RODS Performed at [facility], [address] [place], [place] [address]    Culture   Final    ABUNDANT METHICILLIN RESISTANT STAPHYLOCOCCUS AUREUS ABUNDANT PSEUDOMONAS AERUGINOSA    Report Status 02/23/2021 FINAL  Final   Organism ID, Bacteria METHICILLIN RESISTANT STAPHYLOCOCCUS AUREUS  Final   Organism ID, Bacteria PSEUDOMONAS AERUGINOSA   Final      Susceptibility   Methicillin resistant staphylococcus aureus - MIC*    CIPROFLOXACIN <=0.5 SENSITIVE Sensitive     ERYTHROMYCIN <=0.25 SENSITIVE Sensitive     GENTAMICIN <=0.5 SENSITIVE Sensitive     OXACILLIN >=4 RESISTANT Resistant     TETRACYCLINE <=1 SENSITIVE Sensitive     VANCOMYCIN 1 SENSITIVE Sensitive     TRIMETH/SULFA <=10 SENSITIVE Sensitive     CLINDAMYCIN <=0.25 SENSITIVE Sensitive     RIFAMPIN <=0.5 SENSITIVE Sensitive     Inducible Clindamycin NEGATIVE Sensitive     * ABUNDANT METHICILLIN RESISTANT STAPHYLOCOCCUS AUREUS   Pseudomonas aeruginosa - MIC*    CEFTAZIDIME 4 SENSITIVE Sensitive     CIPROFLOXACIN 0.5 SENSITIVE Sensitive     GENTAMICIN <=1 SENSITIVE Sensitive     IMIPENEM 2 SENSITIVE Sensitive     PIP/TAZO 8 SENSITIVE Sensitive     CEFEPIME 2 SENSITIVE Sensitive     * ABUNDANT PSEUDOMONAS AERUGINOSA  Monkeypox Virus DNA, Qualitative Real-Time PCR     Status: None   Collection Time: 02/23/21  1:42 AM   Specimen: Arm, Right; Sterile Swab  Result Value Ref Range Status   Orthopoxvirus DNA, QL PCR NOT DETECTED NOT DETECTED Final    Comment: (NOTE) Non-variola Orthopoxvirus DNA not detected by real-time PCR. Performed At: [facility] [address] [place], [place] [id number] Jolene Schimke MD DG:[id number]   Culture, Respiratory w Gram Stain     Status: None   Collection Time: 02/23/21 10:05 AM   Specimen: Tracheal Aspirate; Respiratory  Result Value Ref Range Status   Specimen Description TRACHEAL ASPIRATE  Final   Special Requests NONE  Final   Gram Stain   Final    ABUNDANT WBC PRESENT, PREDOMINANTLY PMN FEW GRAM NEGATIVE RODS RARE GRAM POSITIVE COCCI Performed at [facility], [address] [place], [place] [address]    Culture   Final    MODERATE PSEUDOMONAS AERUGINOSA RARE METHICILLIN RESISTANT STAPHYLOCOCCUS AUREUS    Report Status 02/26/2021 FINAL  Final   Organism ID, Bacteria PSEUDOMONAS AERUGINOSA  Final    Organism ID, Bacteria METHICILLIN RESISTANT STAPHYLOCOCCUS AUREUS  Final      Susceptibility   Methicillin resistant staphylococcus aureus - MIC*    CIPROFLOXACIN <=0.5 SENSITIVE Sensitive     ERYTHROMYCIN <=0.25 SENSITIVE Sensitive     GENTAMICIN <=0.5 SENSITIVE Sensitive     OXACILLIN >=4 RESISTANT Resistant     TETRACYCLINE <=1 SENSITIVE Sensitive     VANCOMYCIN <=0.5 SENSITIVE Sensitive     TRIMETH/SULFA <=10 SENSITIVE Sensitive     CLINDAMYCIN <=0.25 SENSITIVE Sensitive     RIFAMPIN <=0.5 SENSITIVE Sensitive     Inducible Clindamycin NEGATIVE Sensitive     * RARE METHICILLIN RESISTANT STAPHYLOCOCCUS AUREUS   Pseudomonas aeruginosa - MIC*    CEFTAZIDIME 4 SENSITIVE Sensitive     CIPROFLOXACIN <=0.25 SENSITIVE Sensitive  GENTAMICIN <=1 SENSITIVE Sensitive     IMIPENEM 1 SENSITIVE Sensitive     PIP/TAZO 8 SENSITIVE Sensitive     CEFEPIME 2 SENSITIVE Sensitive     * MODERATE PSEUDOMONAS AERUGINOSA  Culture, blood (Routine X 2) w Reflex to ID Panel     Status: None   Collection Time: 02/24/21 10:53 AM   Specimen: BLOOD RIGHT HAND  Result Value Ref Range Status   Specimen Description BLOOD RIGHT HAND  Final   Special Requests   Final    BOTTLES DRAWN AEROBIC AND ANAEROBIC Blood Culture adequate volume   Culture   Final    NO GROWTH 5 DAYS Performed at [facility], [address] [place], [place] [address]    Report Status 03/01/2021 FINAL  Final  Culture, blood (Routine X 2) w Reflex to ID Panel     Status: None   Collection Time: 02/24/21 10:58 AM   Specimen: BLOOD LEFT HAND  Result Value Ref Range Status   Specimen Description BLOOD LEFT HAND  Final   Special Requests   Final    BOTTLES DRAWN AEROBIC AND ANAEROBIC Blood Culture adequate volume   Culture   Final    NO GROWTH 5 DAYS Performed at [facility], [address] [place], [place] [address]    Report Status 03/01/2021 FINAL  Final  SARS CORONAVIRUS 2 (TAT 6-24 HRS) Nasopharyngeal Nasopharyngeal  Swab     Status: None   Collection Time: 02/25/21  9:20 AM   Specimen: Nasopharyngeal Swab  Result Value Ref Range Status   SARS Coronavirus 2 NEGATIVE NEGATIVE Final    Comment: (NOTE) SARS-CoV-2 target nucleic acids are NOT DETECTED.  The SARS-CoV-2 RNA is generally detectable in upper and lower respiratory specimens during the acute phase of infection. Negative results do not preclude SARS-CoV-2 infection, do not rule out co-infections with other pathogens, and should not be used as the sole basis for treatment or other patient management decisions. Negative results must be combined with clinical observations, patient history, and epidemiological information. The expected result is Negative.  Fact Sheet for Patients: HairSlick.nohttps://www.fda.gov/media/138098/download  Fact Sheet for Healthcare Providers: quierodirigir.comhttps://www.fda.gov/media/138095/download  This test is not yet approved or cleared by the [place] FDA and  has been authorized for detection and/or diagnosis of SARS-CoV-2 by FDA under an Emergency Use Authorization (EUA). This EUA will remain  in effect (meaning this test can be used) for the duration of the COVID-19 declaration under Se ction 564(b)(1) of the Act, 21 U.S.C. section 360bbb-3(b)(1), unless the authorization is terminated or revoked sooner.  Performed at [facility], [address] [place], [place] [address]   Culture, Respiratory w Gram Stain     Status: None   Collection Time: 03/04/21 10:37 AM   Specimen: Tracheal Aspirate; Respiratory  Result Value Ref Range Status   Specimen Description TRACHEAL ASPIRATE  Final   Special Requests NONE  Final   Gram Stain   Final    FEW SQUAMOUS EPITHELIAL CELLS PRESENT MODERATE WBC PRESENT, PREDOMINANTLY PMN MODERATE GRAM NEGATIVE RODS    Culture   Final    FEW PSEUDOMONAS AERUGINOSA NO STAPHYLOCOCCUS AUREUS ISOLATED Performed at [facility], [address] [place], [place] [address]    Report  Status 03/07/2021 FINAL  Final   Organism ID, Bacteria PSEUDOMONAS AERUGINOSA  Final      Susceptibility   Pseudomonas aeruginosa - MIC*    CEFTAZIDIME 16 INTERMEDIATE Intermediate  CIPROFLOXACIN <=0.25 SENSITIVE Sensitive     GENTAMICIN <=1 SENSITIVE Sensitive     IMIPENEM 2 SENSITIVE Sensitive     * FEW PSEUDOMONAS AERUGINOSA  Culture, Respiratory w Gram Stain     Status: None   Collection Time: 03/06/21  1:56 PM   Specimen: Tracheal Aspirate; Respiratory  Result Value Ref Range Status   Specimen Description TRACHEAL ASPIRATE  Final   Special Requests NONE  Final   Gram Stain   Final    MODERATE SQUAMOUS EPITHELIAL CELLS PRESENT MODERATE WBC PRESENT, PREDOMINANTLY PMN FEW GRAM NEGATIVE RODS FEW GRAM POSITIVE COCCI Performed at [facility], [address], [place], [place] [address]    Culture   Final    ABUNDANT PSEUDOMONAS AERUGINOSA ABUNDANT METHICILLIN RESISTANT STAPHYLOCOCCUS AUREUS    Report Status 03/09/2021 FINAL  Final   Organism ID, Bacteria PSEUDOMONAS AERUGINOSA  Final   Organism ID, Bacteria METHICILLIN RESISTANT STAPHYLOCOCCUS AUREUS  Final      Susceptibility   Methicillin resistant staphylococcus aureus - MIC*    CIPROFLOXACIN <=0.5 SENSITIVE Sensitive     ERYTHROMYCIN <=0.25 SENSITIVE Sensitive     GENTAMICIN <=0.5 SENSITIVE Sensitive     OXACILLIN >=4 RESISTANT Resistant     TETRACYCLINE <=1 SENSITIVE Sensitive     VANCOMYCIN <=0.5 SENSITIVE Sensitive     TRIMETH/SULFA <=10 SENSITIVE Sensitive     CLINDAMYCIN <=0.25 SENSITIVE Sensitive     RIFAMPIN <=0.5 SENSITIVE Sensitive     Inducible Clindamycin NEGATIVE Sensitive     * ABUNDANT METHICILLIN RESISTANT STAPHYLOCOCCUS AUREUS   Pseudomonas aeruginosa - MIC*    CEFTAZIDIME 2 SENSITIVE Sensitive     CIPROFLOXACIN <=0.25 SENSITIVE Sensitive     GENTAMICIN <=1 SENSITIVE Sensitive     IMIPENEM 0.5 SENSITIVE Sensitive     PIP/TAZO <=4 SENSITIVE Sensitive     CEFEPIME 2 SENSITIVE Sensitive     *  ABUNDANT PSEUDOMONAS AERUGINOSA  Aerobic Culture w Gram Stain (superficial specimen)     Status: None   Collection Time: 03/06/21  3:13 PM   Specimen: Wound  Result Value Ref Range Status   Specimen Description WOUND  Final   Special Requests EAR RIGHT  Final   Gram Stain   Final    ABUNDANT WBC PRESENT,BOTH PMN AND MONONUCLEAR RARE SQUAMOUS EPITHELIAL CELLS PRESENT ABUNDANT GRAM NEGATIVE RODS FEW GRAM VARIABLE ROD FEW GRAM POSITIVE COCCI FEW YEAST Performed at [facility], [address] [place], [place] [address]    Culture   Final    MODERATE ENTEROBACTER CLOACAE FEW KLEBSIELLA PNEUMONIAE    Report Status 03/09/2021 FINAL  Final   Organism ID, Bacteria ENTEROBACTER CLOACAE  Final   Organism ID, Bacteria KLEBSIELLA PNEUMONIAE  Final      Susceptibility   Enterobacter cloacae - MIC*    CEFAZOLIN >=64 RESISTANT Resistant     CEFEPIME <=0.12 SENSITIVE Sensitive     CEFTAZIDIME <=1 SENSITIVE Sensitive     CIPROFLOXACIN <=0.25 SENSITIVE Sensitive     GENTAMICIN <=1 SENSITIVE Sensitive     IMIPENEM <=0.25 SENSITIVE Sensitive     TRIMETH/SULFA <=20 SENSITIVE Sensitive     PIP/TAZO <=4 SENSITIVE Sensitive     * MODERATE ENTEROBACTER CLOACAE   Klebsiella pneumoniae - MIC*    AMPICILLIN >=32 RESISTANT Resistant     CEFAZOLIN <=4 SENSITIVE Sensitive     CEFEPIME <=0.12 SENSITIVE Sensitive     CEFTAZIDIME <=1 SENSITIVE Sensitive     CEFTRIAXONE <=0.25 SENSITIVE Sensitive     CIPROFLOXACIN <=0.25  SENSITIVE Sensitive     GENTAMICIN <=1 SENSITIVE Sensitive     IMIPENEM <=0.25 SENSITIVE Sensitive     TRIMETH/SULFA <=20 SENSITIVE Sensitive     AMPICILLIN/SULBACTAM 4 SENSITIVE Sensitive     PIP/TAZO <=4 SENSITIVE Sensitive     * FEW KLEBSIELLA PNEUMONIAE  Culture, Respiratory w Gram Stain     Status: None   Collection Time: 03/09/21 10:46 AM   Specimen: Tracheal Aspirate; Respiratory  Result Value Ref Range Status   Specimen Description TRACHEAL ASPIRATE  Final   Special  Requests NONE  Final   Gram Stain   Final    FEW WBC PRESENT, PREDOMINANTLY PMN FEW GRAM NEGATIVE RODS Performed at [facility], [address], [place], [place] [address]    Culture ABUNDANT PSEUDOMONAS AERUGINOSA  Final   Report Status 03/15/2021 FINAL  Final   Organism ID, Bacteria PSEUDOMONAS AERUGINOSA  Final      Susceptibility   Pseudomonas aeruginosa - MIC*    CEFTAZIDIME 4 SENSITIVE Sensitive     CIPROFLOXACIN <=0.25 SENSITIVE Sensitive     GENTAMICIN <=1 SENSITIVE Sensitive     IMIPENEM 2 SENSITIVE Sensitive     PIP/TAZO 8 SENSITIVE Sensitive     CEFEPIME 2 SENSITIVE Sensitive     * ABUNDANT PSEUDOMONAS AERUGINOSA  Surgical pcr screen     Status: Abnormal   Collection Time: 03/11/21  7:26 AM   Specimen: Nasal Mucosa; Nasal Swab  Result Value Ref Range Status   MRSA, PCR POSITIVE (A) NEGATIVE Final    Comment: RESULT CALLED TO, READ BACK BY AND VERIFIED WITH: RN P.TRAVIS AT 0915 ON 03/11/2021 BY T.SAAD.    Staphylococcus aureus POSITIVE (A) NEGATIVE Final    Comment: (NOTE) The Xpert SA Assay (FDA approved for NASAL specimens in patients 51 years of age and older), is one component of a comprehensive surveillance program. It is not intended to diagnose infection nor to guide or monitor treatment. Performed at [facility], [address] [place], [place] [address]   Culture, blood (Routine X 2) w Reflex to ID Panel     Status: None   Collection Time: 03/13/21 11:15 AM   Specimen: BLOOD  Result Value Ref Range Status   Specimen Description BLOOD LEFT ANTECUBITAL  Final   Special Requests AEROBIC BOTTLE ONLY Blood Culture adequate volume  Final   Culture   Final    NO GROWTH 5 DAYS Performed at [facility], [address] [place], [place] [address]    Report Status 03/18/2021 FINAL  Final  Culture, blood (Routine X 2) w Reflex to ID Panel     Status: None   Collection Time: 03/13/21 11:15 AM   Specimen: BLOOD  Result Value Ref Range Status    Specimen Description BLOOD BLOOD LEFT FOREARM  Final   Special Requests AEROBIC BOTTLE ONLY Blood Culture adequate volume  Final   Culture   Final    NO GROWTH 5 DAYS Performed at [facility], [address], [place], [place] [address]    Report Status 03/18/2021 FINAL  Final  Culture, Respiratory w Gram Stain     Status: None   Collection Time: 03/15/21 10:18 AM   Specimen: Tracheal Aspirate; Respiratory  Result Value Ref Range Status   Specimen Description TRACHEAL ASPIRATE  Final   Special Requests NONE  Final   Gram Stain   Final    ABUNDANT WBC PRESENT, PREDOMINANTLY PMN NO ORGANISMS SEEN Performed at [facility], [address], [place], [place] [address]    Culture   Final    MODERATE PSEUDOMONAS AERUGINOSA MODERATE ACINETOBACTER CALCOACETICUS/BAUMANNII COMPLEX    Report Status 03/18/2021 FINAL  Final   Organism ID, Bacteria PSEUDOMONAS AERUGINOSA  Final   Organism ID, Bacteria ACINETOBACTER CALCOACETICUS/BAUMANNII COMPLEX  Final      Susceptibility   Acinetobacter calcoaceticus/baumannii complex - MIC*    CEFTAZIDIME 4 SENSITIVE Sensitive     CIPROFLOXACIN <=0.25 SENSITIVE Sensitive     GENTAMICIN <=1 SENSITIVE Sensitive     IMIPENEM <=0.25 SENSITIVE Sensitive     PIP/TAZO <=4 SENSITIVE Sensitive     TRIMETH/SULFA <=20 SENSITIVE Sensitive     AMPICILLIN/SULBACTAM <=2 SENSITIVE Sensitive     * MODERATE ACINETOBACTER CALCOACETICUS/BAUMANNII COMPLEX   Pseudomonas aeruginosa - MIC*    CEFTAZIDIME 4 SENSITIVE Sensitive     CIPROFLOXACIN <=0.25 SENSITIVE Sensitive     GENTAMICIN <=1 SENSITIVE Sensitive     IMIPENEM 2 SENSITIVE Sensitive     PIP/TAZO <=4 SENSITIVE Sensitive     CEFEPIME 4 SENSITIVE Sensitive     * MODERATE PSEUDOMONAS AERUGINOSA    Anti-infectives:  Anti-infectives (From admission, onward)    Start     Dose/Rate Route Frequency Ordered Stop   03/15/21 0400  vancomycin (VANCOCIN) IVPB 1000 mg/200 mL premix  Status:  Discontinued         1,000 mg 200 mL/hr over 60 Minutes Intravenous Every 8 hours 03/15/21 0125 03/17/21 0928   03/13/21 1200  ceFEPIme (MAXIPIME) 2 g in sodium chloride 0.9 % 100 mL IVPB        2 g 200 mL/hr over 30 Minutes Intravenous Every 8 hours 03/13/21 1103 03/20/21 0429   03/12/21 2200  vancomycin (VANCOREADY) IVPB 750 mg/150 mL  Status:  Discontinued        750 mg 150 mL/hr over 60 Minutes Intravenous Every 8 hours 03/12/21 1537 03/15/21 0125   03/12/21 0530  vancomycin (VANCOREADY) IVPB 750 mg/150 mL  Status:  Discontinued        750 mg 150 mL/hr over 60 Minutes Intravenous Every 8 hours 03/11/21 2145 03/12/21 1340   03/10/21 1400  piperacillin-tazobactam (ZOSYN) IVPB 3.375 g  Status:  Discontinued        3.375 g 12.5 mL/hr over 240 Minutes Intravenous Every 8 hours 03/10/21 0909 03/12/21 1340   03/10/21 1000  vancomycin (VANCOCIN) IVPB 1000 mg/200 mL premix  Status:  Discontinued        1,000 mg 200 mL/hr over 60 Minutes Intravenous Every 8 hours 03/10/21 0908 03/11/21 2145   03/06/21 1415  piperacillin-tazobactam (ZOSYN) IVPB 3.375 g        3.375 g 12.5 mL/hr over 240 Minutes Intravenous Every 8 hours 03/06/21 1324 03/10/21 0902   02/24/21 1200  vancomycin (VANCOCIN) IVPB 1000 mg/200 mL premix        1,000 mg 200 mL/hr over 60 Minutes Intravenous Every 8 hours 02/24/21 0934 03/02/21 2020   02/23/21 0200  vancomycin (VANCOREADY) IVPB 1500 mg/300 mL  Status:  Discontinued        1,500 mg 150 mL/hr over 120 Minutes Intravenous Every 8 hours 02/22/21 1931 02/24/21 0934   02/22/21 2100  vancomycin (VANCOCIN) 250 mg in sodium chloride 0.9 % 100 mL IVPB        250 mg 100 mL/hr over 60 Minutes Intravenous  Once 02/22/21 2012 02/22/21 2150   02/22/21 2030  vancomycin (VANCOCIN) 250 mg in sodium chloride 0.9 % 500 mL IVPB  Status:  Discontinued        250 mg 250 mL/hr over 120 Minutes Intravenous  Once 02/22/21 1931 02/22/21 2012   02/21/21 1800  vancomycin (VANCOREADY) IVPB 1250 mg/250 mL  Status:   Discontinued        1,250 mg 166.7 mL/hr over 90 Minutes Intravenous Every 8 hours 02/21/21 0851 02/22/21 1931   02/21/21 1000  ceFEPIme (MAXIPIME) 2 g in sodium chloride 0.9 % 100 mL IVPB        2 g 200 mL/hr over 30 Minutes Intravenous Every 8 hours 02/21/21 0851 03/02/21 1749   02/21/21 1000  vancomycin (VANCOREADY) IVPB 2000 mg/400 mL        2,000 mg 200 mL/hr over 120 Minutes Intravenous  Once 02/21/21 1610 02/21/21 1306      Consults: Treatment Team:  Serena Colonel, MD [place] Galas, MD    Studies:    Events:  Subjective:    Overnight Issues:   Objective:  Vital signs for last 24 hours: Temp:  [97.9 F (36.6 C)-98.7 F (37.1 C)] 98.6 F (37 C) (09/12 0735) Pulse Rate:  [73-111] 83 (09/12 0600) Resp:  [10-30] 19 (09/12 0600) BP: (88-151)/(56-128) 119/93 (09/12 0600) SpO2:  [92 %-100 %] 100 % (09/12 0600) FiO2 (%):  [40 %] 40 % (09/11 1949) Weight:  [67.9 kg] 67.9 kg (09/12 0327)  Hemodynamic parameters for last 24 hours:    Intake/Output from previous day: 09/11 0701 - 09/12 0700 In: 2509.3 [I.V.:629.3; NG/GT:1880] Out: 3050 [Urine:2850; Stool:200]  Intake/Output this shift: No intake/output data recorded.  Vent settings for last 24 hours: FiO2 (%):  [40 %] 40 %  Physical Exam:  General: alert and no respiratory distress Neuro: alert and F/C HEENT/Neck: trach-clean, intact Resp: clear to auscultation bilaterally CVS: RRR GI: soft, NT, PEG in place, binder Extremities: no edema, no erythema, pulses WNL  Results for orders placed or performed during the hospital

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
## 2021-03-22 ENCOUNTER — Inpatient Hospital Stay (HOSPITAL_COMMUNITY): Payer: 59

## 2021-03-22 LAB — CBC WITH DIFFERENTIAL/PLATELET
Abs Immature Granulocytes: 0.05 10*3/uL (ref 0.00–0.07)
Basophils Absolute: 0 10*3/uL (ref 0.0–0.1)
Basophils Relative: 0 %
Eosinophils Absolute: 0.1 10*3/uL (ref 0.0–0.5)
Eosinophils Relative: 1 %
HCT: 34.8 % — ABNORMAL LOW (ref 39.0–52.0)
Hemoglobin: 10.9 g/dL — ABNORMAL LOW (ref 13.0–17.0)
Immature Granulocytes: 1 %
Lymphocytes Relative: 19 %
Lymphs Abs: 1.8 10*3/uL (ref 0.7–4.0)
MCH: 30.6 pg (ref 26.0–34.0)
MCHC: 31.3 g/dL (ref 30.0–36.0)
MCV: 97.8 fL (ref 80.0–100.0)
Monocytes Absolute: 0.6 10*3/uL (ref 0.1–1.0)
Monocytes Relative: 6 %
Neutro Abs: 7 10*3/uL (ref 1.7–7.7)
Neutrophils Relative %: 73 %
Platelets: 333 10*3/uL (ref 150–400)
RBC: 3.56 MIL/uL — ABNORMAL LOW (ref 4.22–5.81)
RDW: 16.1 % — ABNORMAL HIGH (ref 11.5–15.5)
WBC: 9.5 10*3/uL (ref 4.0–10.5)
nRBC: 0 % (ref 0.0–0.2)

## 2021-03-22 LAB — GLUCOSE, CAPILLARY
Glucose-Capillary: 107 mg/dL — ABNORMAL HIGH (ref 70–99)
Glucose-Capillary: 115 mg/dL — ABNORMAL HIGH (ref 70–99)
Glucose-Capillary: 110 mg/dL — ABNORMAL HIGH (ref 70–99)
Glucose-Capillary: 134 mg/dL — ABNORMAL HIGH (ref 70–99)
Glucose-Capillary: 126 mg/dL — ABNORMAL HIGH (ref 70–99)
Glucose-Capillary: 144 mg/dL — ABNORMAL HIGH (ref 70–99)

## 2021-03-22 LAB — BASIC METABOLIC PANEL
Anion gap: 8 (ref 5–15)
BUN: 16 mg/dL (ref 6–20)
CO2: 27 mmol/L (ref 22–32)
Calcium: 9.8 mg/dL (ref 8.9–10.3)
Chloride: 103 mmol/L (ref 98–111)
Creatinine, Ser: 0.42 mg/dL — ABNORMAL LOW (ref 0.61–1.24)
GFR, Estimated: >60 mL/min (ref >60)
Glucose, Bld: 111 mg/dL — ABNORMAL HIGH (ref 70–99)
Potassium: 3.7 mmol/L (ref 3.5–5.1)
Sodium: 138 mmol/L (ref 135–145)

## 2021-03-22 MED ORDER — HALOPERIDOL 5 MG PO TABS
10.0000 mg | ORAL_TABLET | Freq: Four times a day (QID) | ORAL | Status: DC
  Administered 2021-03-22 – 2021-03-26 (×12): 10 mg via ORAL
  Filled 2021-03-22 (×20): qty 2

## 2021-03-22 MED ORDER — DIATRIZOATE MEGLUMINE & SODIUM 66-10 % PO SOLN
ORAL | Status: AC
  Filled 2021-03-22: qty 30

## 2021-03-22 MED ORDER — HALOPERIDOL LACTATE 5 MG/ML IJ SOLN
10.0000 mg | Freq: Four times a day (QID) | INTRAMUSCULAR | Status: DC
  Administered 2021-03-22 – 2021-03-26 (×6): 10 mg via INTRAVENOUS
  Filled 2021-03-22 (×7): qty 2

## 2021-03-22 MED ORDER — HALOPERIDOL LACTATE 5 MG/ML IJ SOLN: 10.0000 mg | Freq: Four times a day (QID) | INTRAMUSCULAR | Status: DC

## 2021-03-22 NOTE — Progress Notes (Signed)
Called to room by pt's wife at bedside, notified that pt's trach was "out."  Upon assessment, trach was found pulled out by pt. Called RT to bedside. Replacement trach was placed into stoma. Oxygen saturations maintained 93-94% through out duration. MD Lovick called and notified. Pt's vitals stable at this time.

## 2021-03-22 NOTE — Procedures (Addendum)
Tracheostomy Change Note  Patient Details:   Name: Scott [name] DOB: Jul 11, 1985 MRN: [id number]    Airway Documentation:     Evaluation  O2 sats: stable throughout Complications: No apparent complications Patient did tolerate procedure well. Bilateral Breath Sounds: Diminished SPO2 96% on .35 ATC #4 SH Flex CFS placed, good color change ETCO2    Toula Moos 03/22/2021, 3:21 PM

## 2021-03-22 NOTE — Procedures (Signed)
Procedure Name: gastrostomy tube replacement Indication: patient removed gastrostomy tube PROCEDURE: Patient pulled out his G tube yesterday. A foley catheter was placed to maintain the tract. Foley catheter balloon deflated and foley removed intact. 24 fr G tube placed through tract easily and patient tolerated with mild discomfort. Balloon inflated with 6 mL sterile normal saline. Will obtain abdominal xray with contrast through tube to confirm placement.   Dr. Bedelia Person was present at bedside for duration of procedure.  Eric Form, [facility] Surgery 03/22/2021, 9:53 AM Please see Amion for pager number during day hours 7:00am-4:30pm
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
## 2021-03-22 NOTE — Progress Notes (Signed)
RT called to bedside, pt had decannulated himself.  By the time I arrived Scott [name] had been placed back.  I confirmed placement with ETCO2 and BBS.  CXR has been ordered stat

## 2021-03-23 LAB — CBC WITH DIFFERENTIAL/PLATELET
Abs Immature Granulocytes: 0.03 10*3/uL (ref 0.00–0.07)
Basophils Absolute: 0 10*3/uL (ref 0.0–0.1)
Basophils Relative: 1 %
Eosinophils Absolute: 0.2 10*3/uL (ref 0.0–0.5)
Eosinophils Relative: 2 %
HCT: 34.3 % — ABNORMAL LOW (ref 39.0–52.0)
Hemoglobin: 11.1 g/dL — ABNORMAL LOW (ref 13.0–17.0)
Immature Granulocytes: 0 %
Lymphocytes Relative: 23 %
Lymphs Abs: 2 10*3/uL (ref 0.7–4.0)
MCH: 31.2 pg (ref 26.0–34.0)
MCHC: 32.4 g/dL (ref 30.0–36.0)
MCV: 96.3 fL (ref 80.0–100.0)
Monocytes Absolute: 0.7 10*3/uL (ref 0.1–1.0)
Monocytes Relative: 8 %
Neutro Abs: 5.8 10*3/uL (ref 1.7–7.7)
Neutrophils Relative %: 66 %
Platelets: 312 10*3/uL (ref 150–400)
RBC: 3.56 MIL/uL — ABNORMAL LOW (ref 4.22–5.81)
RDW: 15.9 % — ABNORMAL HIGH (ref 11.5–15.5)
WBC: 8.7 10*3/uL (ref 4.0–10.5)
nRBC: 0 % (ref 0.0–0.2)

## 2021-03-23 LAB — BASIC METABOLIC PANEL
Anion gap: 6 (ref 5–15)
BUN: 19 mg/dL (ref 6–20)
CO2: 27 mmol/L (ref 22–32)
Calcium: 9.2 mg/dL (ref 8.9–10.3)
Chloride: 108 mmol/L (ref 98–111)
Creatinine, Ser: 0.48 mg/dL — ABNORMAL LOW (ref 0.61–1.24)
GFR, Estimated: >60 mL/min (ref >60)
Glucose, Bld: 112 mg/dL — ABNORMAL HIGH (ref 70–99)
Potassium: 3.3 mmol/L — ABNORMAL LOW (ref 3.5–5.1)
Sodium: 141 mmol/L (ref 135–145)

## 2021-03-23 LAB — GLUCOSE, CAPILLARY
Glucose-Capillary: 133 mg/dL — ABNORMAL HIGH (ref 70–99)
Glucose-Capillary: 116 mg/dL — ABNORMAL HIGH (ref 70–99)
Glucose-Capillary: 118 mg/dL — ABNORMAL HIGH (ref 70–99)
Glucose-Capillary: 107 mg/dL — ABNORMAL HIGH (ref 70–99)
Glucose-Capillary: 117 mg/dL — ABNORMAL HIGH (ref 70–99)
Glucose-Capillary: 119 mg/dL — ABNORMAL HIGH (ref 70–99)

## 2021-03-23 LAB — MAGNESIUM: Magnesium: 2.1 mg/dL (ref 1.7–2.4)

## 2021-03-23 MED ORDER — BETHANECHOL CHLORIDE 25 MG PO TABS
25.0000 mg | ORAL_TABLET | Freq: Three times a day (TID) | ORAL | Status: DC
  Administered 2021-03-23 – 2021-04-01 (×27): 25 mg
  Filled 2021-03-23 (×30): qty 1

## 2021-03-23 MED ORDER — ORAL CARE MOUTH RINSE
15.0000 mL | Freq: Two times a day (BID) | OROMUCOSAL | Status: DC
  Administered 2021-03-23 – 2021-04-01 (×16): 15 mL via OROMUCOSAL

## 2021-03-23 MED ORDER — POTASSIUM CHLORIDE 20 MEQ PO PACK
40.0000 meq | PACK | ORAL | Status: AC
Start: 2021-03-23 — End: 2021-03-23
  Administered 2021-03-23 (×2): 40 meq
  Filled 2021-03-23 (×2): qty 2

## 2021-03-23 MED ORDER — CLONIDINE HCL 0.2 MG PO TABS
0.3000 mg | ORAL_TABLET | Freq: Four times a day (QID) | ORAL | Status: DC
  Administered 2021-03-23 – 2021-03-24 (×5): 0.3 mg via ORAL
  Filled 2021-03-23 (×7): qty 1

## 2021-03-23 MED ORDER — PIVOT 1.5 CAL PO LIQD
1000.0000 mL | ORAL | Status: AC
  Administered 2021-03-23 – 2021-03-24 (×2): 1000 mL

## 2021-03-23 MED ORDER — CHLORHEXIDINE GLUCONATE 0.12 % MT SOLN
15.0000 mL | Freq: Two times a day (BID) | OROMUCOSAL | Status: DC
  Administered 2021-03-23 – 2021-04-01 (×18): 15 mL via OROMUCOSAL
  Filled 2021-03-23 (×14): qty 15

## 2021-03-23 MED ORDER — LOPERAMIDE HCL 1 MG/7.5ML PO SUSP
2.0000 mg | ORAL | Status: DC | PRN
  Administered 2021-03-24 – 2021-03-25 (×2): 2 mg
  Filled 2021-03-23 (×3): qty 15
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
## 2021-03-23 NOTE — Progress Notes (Signed)
Visit made to patients room for trach assessment and suctioning.  Patient has pulled trach out some how.  Due to a large amount facial hair we are not sure how long it was out.  RT attempted to reinsertion two times with no success.  Patients respiratory status is good SP02 94% RR 19 113/58 blood pressure.  Patient placed on 2 lpm nasal cannula.  RN called Dr. Dwain Sarna and he agreed it was okay to leave trach out.
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
## 2021-03-23 NOTE — Progress Notes (Signed)
  Inpatient Rehabilitation Admissions Coordinator   Inpatient rehab consult received. I met with pt's girlfriend, April, at bedside after patient worked with TBI team. I discussed goals and expectations of a possible CIR admit. She works form home and can provide projected 24/7 assist after a CIR admit. She is to provide me his [employer] insurance card information. She is working with [employer] on disability and Clinical biochemist. Note remains on precedex. I will follow to assist with planning CIR admit when appropriate.  Danne Baxter, RN, MSN Rehab Admissions Coordinator [phone] 03/23/2021 11:44 AM

## 2021-03-23 NOTE — Progress Notes (Signed)
Patient ID: Scott [name], male   DOB: [birth year], 35 y.o.   MRN: [id number] I spoke with his GF at the bedside to update her on his progress.  Violeta Gelinas, MD, MPH, FACS Please use [email or web address] to contact on call provider
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
## 2021-03-23 NOTE — Progress Notes (Signed)
ENT follow-up  He is more awake.  He is able to follow simple commands.  He is not particularly verbal.  His wife is in the room with him.  On examination his facial nerve is symmetric and fully functional.  The right ear canal is now clean and dry with no residual infection but there is some dried up blood/cerumen partially obscuring the tympanic membrane.  The part that is visible looks healthy.  He can stop the drops now.  He would recommend complete audiometric evaluation as an outpatient when clinically appropriate.

## 2021-03-23 NOTE — Progress Notes (Signed)
Patient ID: Scott [name], male   DOB: [birth year], 35 y.o.   MRN: [id number] Follow up - Trauma Critical Care  Patient Details:    Scott [name] is an 35 y.o. male.  Lines/tubes : PICC Triple Lumen 02/20/21 PICC Right Basilic 39 cm 0 cm (Active)  Indication for Insertion or Continuance of Line Prolonged intravenous therapies 03/22/21 2000  Exposed Catheter (cm) 1 cm 03/07/21 1200  Site Assessment Clean;Dry;Intact 03/22/21 2000  Lumen #1 Status In-line blood sampling system in place;Blood return noted 03/22/21 2000  Lumen #2 Status Infusing 03/22/21 2000  Lumen #3 Status Infusing 03/22/21 2000  Dressing Type Transparent;Securing device 03/22/21 2000  Dressing Status Clean;Dry;Intact 03/22/21 2000  Antimicrobial disc in place? Yes 03/22/21 2000  Safety Lock Not Applicable 03/22/21 2000  Line Care Connections checked and tightened 03/22/21 2000  Line Adjustment (NICU/IV Team Only) No 03/12/21 0800  Dressing Intervention New dressing;Dressing reinforced 03/20/21 0800  Dressing Change Due 03/26/21 03/22/21 2000     Gastrostomy/Enterostomy Gastrostomy 24 Fr. LLQ (Active)  Tube Status Patent 03/22/21 2000  Dressing Status Clean;Dry 03/22/21 2000  Dressing Type Abdominal Binder 03/22/21 2000  G Port Intake (mL) 60 ml 03/22/21 2000     Flatus Tube/Pouch (Active)  Daily care Skin around tube assessed;Flushed tube with 24mL water (document as intake) 03/22/21 2000  Output (mL) 0 mL 03/22/21 0800  Intake (mL) 60 mL 03/22/21 2000     External Urinary Catheter (Active)  Collection Container Standard drainage bag 03/22/21 2000  Suction (Verified suction is between 40-80 mmHg) N/A (Patient has condom catheter) 03/22/21 2000  Securement Method Securing device (Describe) 03/22/21 2000  Site Assessment Clean;Intact 03/22/21 2000  Intervention Male External Urinary Catheter Replaced 03/18/21 0800  Output (mL) 250 mL 03/22/21 0400    Microbiology/Sepsis markers: Results for orders  placed or performed during the hospital encounter of 02/15/21  Resp Panel by RT-PCR (Flu A&B, Covid) Nasopharyngeal Swab     Status: None   Collection Time: 02/15/21  8:14 PM   Specimen: Nasopharyngeal Swab; Nasopharyngeal(NP) swabs in vial transport medium  Result Value Ref Range Status   SARS Coronavirus 2 by RT PCR NEGATIVE NEGATIVE Final    Comment: (NOTE) SARS-CoV-2 target nucleic acids are NOT DETECTED.  The SARS-CoV-2 RNA is generally detectable in upper respiratory specimens during the acute phase of infection. The lowest concentration of SARS-CoV-2 viral copies this assay can detect is 138 copies/mL. A negative result does not preclude SARS-Cov-2 infection and should not be used as the sole basis for treatment or other patient management decisions. A negative result may occur with  improper specimen collection/handling, submission of specimen other than nasopharyngeal swab, presence of viral mutation(s) within the areas targeted by this assay, and inadequate number of viral copies(<138 copies/mL). A negative result must be combined with clinical observations, patient history, and epidemiological information. The expected result is Negative.  Fact Sheet for Patients:  BloggerCourse.com  Fact Sheet for Healthcare Providers:  SeriousBroker.it  This test is no t yet approved or cleared by the [place] FDA and  has been authorized for detection and/or diagnosis of SARS-CoV-2 by FDA under an Emergency Use Authorization (EUA). This EUA will remain  in effect (meaning this test can be used) for the duration of the COVID-19 declaration under Section 564(b)(1) of the Act, 21 U.S.C.section 360bbb-3(b)(1), unless the authorization is terminated  or revoked sooner.       Influenza A by PCR NEGATIVE NEGATIVE Final   Influenza B by PCR  NEGATIVE NEGATIVE Final    Comment: (NOTE) The Xpert Xpress SARS-CoV-2/FLU/RSV plus assay is  intended as an aid in the diagnosis of influenza from Nasopharyngeal swab specimens and should not be used as a sole basis for treatment. Nasal washings and aspirates are unacceptable for Xpert Xpress SARS-CoV-2/FLU/RSV testing.  Fact Sheet for Patients: BloggerCourse.com  Fact Sheet for Healthcare Providers: SeriousBroker.it  This test is not yet approved or cleared by the [place] FDA and has been authorized for detection and/or diagnosis of SARS-CoV-2 by FDA under an Emergency Use Authorization (EUA). This EUA will remain in effect (meaning this test can be used) for the duration of the COVID-19 declaration under Section 564(b)(1) of the Act, 21 U.S.C. section 360bbb-3(b)(1), unless the authorization is terminated or revoked.  Performed at [facility], [address] [place], [place] [address]   MRSA Next Gen by PCR, Nasal     Status: None   Collection Time: 02/15/21  9:58 PM   Specimen: Nasal Mucosa; Nasal Swab  Result Value Ref Range Status   MRSA by PCR Next Gen NOT DETECTED NOT DETECTED Final    Comment: (NOTE) The GeneXpert MRSA Assay (FDA approved for NASAL specimens only), is one component of a comprehensive MRSA colonization surveillance program. It is not intended to diagnose MRSA infection nor to guide or monitor treatment for MRSA infections. Test performance is not FDA approved in patients less than 43 years old. Performed at [facility], [address] [place], [place] [address]   Culture, Respiratory w Gram Stain     Status: None   Collection Time: 02/17/21  2:04 PM   Specimen: Tracheal Aspirate; Respiratory  Result Value Ref Range Status   Specimen Description TRACHEAL ASPIRATE  Final   Special Requests NONE  Final   Gram Stain   Final    ABUNDANT WBC PRESENT,BOTH PMN AND MONONUCLEAR ABUNDANT GRAM NEGATIVE RODS MODERATE GRAM POSITIVE COCCI RARE GRAM POSITIVE RODS    Culture   Final     FEW Normal respiratory flora-no Staph aureus or Pseudomonas seen Performed at [facility], [address] [place], [place] [address]    Report Status 02/19/2021 FINAL  Final  Culture, blood (routine x 2)     Status: None   Collection Time: 02/17/21  2:24 PM   Specimen: BLOOD  Result Value Ref Range Status   Specimen Description BLOOD BLOOD RIGHT FOREARM  Final   Special Requests   Final    BOTTLES DRAWN AEROBIC AND ANAEROBIC Blood Culture adequate volume   Culture   Final    NO GROWTH 5 DAYS Performed at [facility], [address], [place], [place] [address]    Report Status 02/22/2021 FINAL  Final  Culture, blood (routine x 2)     Status: None   Collection Time: 02/17/21  3:01 PM   Specimen: BLOOD  Result Value Ref Range Status   Specimen Description BLOOD LEFT ANTECUBITAL  Final   Special Requests   Final    BOTTLES DRAWN AEROBIC AND ANAEROBIC Blood Culture adequate volume   Culture   Final    NO GROWTH 5 DAYS Performed at [facility], [address] [place], [place] [address]    Report Status 02/22/2021 FINAL  Final  Culture, Respiratory w Gram Stain     Status: None   Collection Time: 02/20/21 12:49 PM   Specimen: Tracheal Aspirate; Respiratory  Result Value Ref Range Status   Specimen  Description TRACHEAL ASPIRATE  Final   Special Requests NONE  Final   Gram Stain   Final    MODERATE WBC PRESENT,BOTH PMN AND MONONUCLEAR ABUNDANT GRAM POSITIVE COCCI IN PAIRS ABUNDANT GRAM NEGATIVE RODS Performed at [facility], [address] [place], [place] [address]    Culture   Final    ABUNDANT METHICILLIN RESISTANT STAPHYLOCOCCUS AUREUS ABUNDANT PSEUDOMONAS AERUGINOSA    Report Status 02/23/2021 FINAL  Final   Organism ID, Bacteria METHICILLIN RESISTANT STAPHYLOCOCCUS AUREUS  Final   Organism ID, Bacteria PSEUDOMONAS AERUGINOSA  Final      Susceptibility   Methicillin resistant staphylococcus aureus - MIC*    CIPROFLOXACIN <=0.5 SENSITIVE  Sensitive     ERYTHROMYCIN <=0.25 SENSITIVE Sensitive     GENTAMICIN <=0.5 SENSITIVE Sensitive     OXACILLIN >=4 RESISTANT Resistant     TETRACYCLINE <=1 SENSITIVE Sensitive     VANCOMYCIN 1 SENSITIVE Sensitive     TRIMETH/SULFA <=10 SENSITIVE Sensitive     CLINDAMYCIN <=0.25 SENSITIVE Sensitive     RIFAMPIN <=0.5 SENSITIVE Sensitive     Inducible Clindamycin NEGATIVE Sensitive     * ABUNDANT METHICILLIN RESISTANT STAPHYLOCOCCUS AUREUS   Pseudomonas aeruginosa - MIC*    CEFTAZIDIME 4 SENSITIVE Sensitive     CIPROFLOXACIN 0.5 SENSITIVE Sensitive     GENTAMICIN <=1 SENSITIVE Sensitive     IMIPENEM 2 SENSITIVE Sensitive     PIP/TAZO 8 SENSITIVE Sensitive     CEFEPIME 2 SENSITIVE Sensitive     * ABUNDANT PSEUDOMONAS AERUGINOSA  Monkeypox Virus DNA, Qualitative Real-Time PCR     Status: None   Collection Time: 02/23/21  1:42 AM   Specimen: Arm, Right; Sterile Swab  Result Value Ref Range Status   Orthopoxvirus DNA, QL PCR NOT DETECTED NOT DETECTED Final    Comment: (NOTE) Non-variola Orthopoxvirus DNA not detected by real-time PCR. Performed At: [facility] [address] [place], [place] [id number] Jolene Schimke MD ZM:[id number]   Culture, Respiratory w Gram Stain     Status: None   Collection Time: 02/23/21 10:05 AM   Specimen: Tracheal Aspirate; Respiratory  Result Value Ref Range Status   Specimen Description TRACHEAL ASPIRATE  Final   Special Requests NONE  Final   Gram Stain   Final    ABUNDANT WBC PRESENT, PREDOMINANTLY PMN FEW GRAM NEGATIVE RODS RARE GRAM POSITIVE COCCI Performed at [facility], [address] [place], [place] [address]    Culture   Final    MODERATE PSEUDOMONAS AERUGINOSA RARE METHICILLIN RESISTANT STAPHYLOCOCCUS AUREUS    Report Status 02/26/2021 FINAL  Final   Organism ID, Bacteria PSEUDOMONAS AERUGINOSA  Final   Organism ID, Bacteria METHICILLIN RESISTANT STAPHYLOCOCCUS AUREUS  Final      Susceptibility   Methicillin  resistant staphylococcus aureus - MIC*    CIPROFLOXACIN <=0.5 SENSITIVE Sensitive     ERYTHROMYCIN <=0.25 SENSITIVE Sensitive     GENTAMICIN <=0.5 SENSITIVE Sensitive     OXACILLIN >=4 RESISTANT Resistant     TETRACYCLINE <=1 SENSITIVE Sensitive     VANCOMYCIN <=0.5 SENSITIVE Sensitive     TRIMETH/SULFA <=10 SENSITIVE Sensitive     CLINDAMYCIN <=0.25 SENSITIVE Sensitive     RIFAMPIN <=0.5 SENSITIVE Sensitive     Inducible Clindamycin NEGATIVE Sensitive     * RARE METHICILLIN RESISTANT STAPHYLOCOCCUS AUREUS   Pseudomonas aeruginosa - MIC*    CEFTAZIDIME 4 SENSITIVE Sensitive     CIPROFLOXACIN <=0.25 SENSITIVE Sensitive     GENTAMICIN <=1 SENSITIVE Sensitive  IMIPENEM 1 SENSITIVE Sensitive     PIP/TAZO 8 SENSITIVE Sensitive     CEFEPIME 2 SENSITIVE Sensitive     * MODERATE PSEUDOMONAS AERUGINOSA  Culture, blood (Routine X 2) w Reflex to ID Panel     Status: None   Collection Time: 02/24/21 10:53 AM   Specimen: BLOOD RIGHT HAND  Result Value Ref Range Status   Specimen Description BLOOD RIGHT HAND  Final   Special Requests   Final    BOTTLES DRAWN AEROBIC AND ANAEROBIC Blood Culture adequate volume   Culture   Final    NO GROWTH 5 DAYS Performed at [facility], [address] [place], [place] [address]    Report Status 03/01/2021 FINAL  Final  Culture, blood (Routine X 2) w Reflex to ID Panel     Status: None   Collection Time: 02/24/21 10:58 AM   Specimen: BLOOD LEFT HAND  Result Value Ref Range Status   Specimen Description BLOOD LEFT HAND  Final   Special Requests   Final    BOTTLES DRAWN AEROBIC AND ANAEROBIC Blood Culture adequate volume   Culture   Final    NO GROWTH 5 DAYS Performed at [facility], [address], [place], [place] [address]    Report Status 03/01/2021 FINAL  Final  SARS CORONAVIRUS 2 (TAT 6-24 HRS) Nasopharyngeal Nasopharyngeal Swab     Status: None   Collection Time: 02/25/21  9:20 AM   Specimen: Nasopharyngeal Swab  Result Value  Ref Range Status   SARS Coronavirus 2 NEGATIVE NEGATIVE Final    Comment: (NOTE) SARS-CoV-2 target nucleic acids are NOT DETECTED.  The SARS-CoV-2 RNA is generally detectable in upper and lower respiratory specimens during the acute phase of infection. Negative results do not preclude SARS-CoV-2 infection, do not rule out co-infections with other pathogens, and should not be used as the sole basis for treatment or other patient management decisions. Negative results must be combined with clinical observations, patient history, and epidemiological information. The expected result is Negative.  Fact Sheet for Patients: HairSlick.no  Fact Sheet for Healthcare Providers: quierodirigir.com  This test is not yet approved or cleared by the [place] FDA and  has been authorized for detection and/or diagnosis of SARS-CoV-2 by FDA under an Emergency Use Authorization (EUA). This EUA will remain  in effect (meaning this test can be used) for the duration of the COVID-19 declaration under Se ction 564(b)(1) of the Act, 21 U.S.C. section 360bbb-3(b)(1), unless the authorization is terminated or revoked sooner.  Performed at [facility], [address], [place], [place] [address]   Culture, Respiratory w Gram Stain     Status: None   Collection Time: 03/04/21 10:37 AM   Specimen: Tracheal Aspirate; Respiratory  Result Value Ref Range Status   Specimen Description TRACHEAL ASPIRATE  Final   Special Requests NONE  Final   Gram Stain   Final    FEW SQUAMOUS EPITHELIAL CELLS PRESENT MODERATE WBC PRESENT, PREDOMINANTLY PMN MODERATE GRAM NEGATIVE RODS    Culture   Final    FEW PSEUDOMONAS AERUGINOSA NO STAPHYLOCOCCUS AUREUS ISOLATED Performed at [facility], [address] [place], [place] [address]    Report Status 03/07/2021 FINAL  Final   Organism ID, Bacteria PSEUDOMONAS AERUGINOSA  Final      Susceptibility    Pseudomonas aeruginosa - MIC*    CEFTAZIDIME 16 INTERMEDIATE Intermediate     CIPROFLOXACIN <=0.25 SENSITIVE Sensitive  GENTAMICIN <=1 SENSITIVE Sensitive     IMIPENEM 2 SENSITIVE Sensitive     * FEW PSEUDOMONAS AERUGINOSA  Culture, Respiratory w Gram Stain     Status: None   Collection Time: 03/06/21  1:56 PM   Specimen: Tracheal Aspirate; Respiratory  Result Value Ref Range Status   Specimen Description TRACHEAL ASPIRATE  Final   Special Requests NONE  Final   Gram Stain   Final    MODERATE SQUAMOUS EPITHELIAL CELLS PRESENT MODERATE WBC PRESENT, PREDOMINANTLY PMN FEW GRAM NEGATIVE RODS FEW GRAM POSITIVE COCCI Performed at [facility], [address] [place], [place] [address]    Culture   Final    ABUNDANT PSEUDOMONAS AERUGINOSA ABUNDANT METHICILLIN RESISTANT STAPHYLOCOCCUS AUREUS    Report Status 03/09/2021 FINAL  Final   Organism ID, Bacteria PSEUDOMONAS AERUGINOSA  Final   Organism ID, Bacteria METHICILLIN RESISTANT STAPHYLOCOCCUS AUREUS  Final      Susceptibility   Methicillin resistant staphylococcus aureus - MIC*    CIPROFLOXACIN <=0.5 SENSITIVE Sensitive     ERYTHROMYCIN <=0.25 SENSITIVE Sensitive     GENTAMICIN <=0.5 SENSITIVE Sensitive     OXACILLIN >=4 RESISTANT Resistant     TETRACYCLINE <=1 SENSITIVE Sensitive     VANCOMYCIN <=0.5 SENSITIVE Sensitive     TRIMETH/SULFA <=10 SENSITIVE Sensitive     CLINDAMYCIN <=0.25 SENSITIVE Sensitive     RIFAMPIN <=0.5 SENSITIVE Sensitive     Inducible Clindamycin NEGATIVE Sensitive     * ABUNDANT METHICILLIN RESISTANT STAPHYLOCOCCUS AUREUS   Pseudomonas aeruginosa - MIC*    CEFTAZIDIME 2 SENSITIVE Sensitive     CIPROFLOXACIN <=0.25 SENSITIVE Sensitive     GENTAMICIN <=1 SENSITIVE Sensitive     IMIPENEM 0.5 SENSITIVE Sensitive     PIP/TAZO <=4 SENSITIVE Sensitive     CEFEPIME 2 SENSITIVE Sensitive     * ABUNDANT PSEUDOMONAS AERUGINOSA  Aerobic Culture w Gram Stain (superficial specimen)     Status: None    Collection Time: 03/06/21  3:13 PM   Specimen: Wound  Result Value Ref Range Status   Specimen Description WOUND  Final   Special Requests EAR RIGHT  Final   Gram Stain   Final    ABUNDANT WBC PRESENT,BOTH PMN AND MONONUCLEAR RARE SQUAMOUS EPITHELIAL CELLS PRESENT ABUNDANT GRAM NEGATIVE RODS FEW GRAM VARIABLE ROD FEW GRAM POSITIVE COCCI FEW YEAST Performed at [facility], [address], [place], [place] [address]    Culture   Final    MODERATE ENTEROBACTER CLOACAE FEW KLEBSIELLA PNEUMONIAE    Report Status 03/09/2021 FINAL  Final   Organism ID, Bacteria ENTEROBACTER CLOACAE  Final   Organism ID, Bacteria KLEBSIELLA PNEUMONIAE  Final      Susceptibility   Enterobacter cloacae - MIC*    CEFAZOLIN >=64 RESISTANT Resistant     CEFEPIME <=0.12 SENSITIVE Sensitive     CEFTAZIDIME <=1 SENSITIVE Sensitive     CIPROFLOXACIN <=0.25 SENSITIVE Sensitive     GENTAMICIN <=1 SENSITIVE Sensitive     IMIPENEM <=0.25 SENSITIVE Sensitive     TRIMETH/SULFA <=20 SENSITIVE Sensitive     PIP/TAZO <=4 SENSITIVE Sensitive     * MODERATE ENTEROBACTER CLOACAE   Klebsiella pneumoniae - MIC*    AMPICILLIN >=32 RESISTANT Resistant     CEFAZOLIN <=4 SENSITIVE Sensitive     CEFEPIME <=0.12 SENSITIVE Sensitive     CEFTAZIDIME <=1 SENSITIVE Sensitive     CEFTRIAXONE <=0.25 SENSITIVE Sensitive     CIPROFLOXACIN <=0.25 SENSITIVE Sensitive     GENTAMICIN <=1  SENSITIVE Sensitive     IMIPENEM <=0.25 SENSITIVE Sensitive     TRIMETH/SULFA <=20 SENSITIVE Sensitive     AMPICILLIN/SULBACTAM 4 SENSITIVE Sensitive     PIP/TAZO <=4 SENSITIVE Sensitive     * FEW KLEBSIELLA PNEUMONIAE  Culture, Respiratory w Gram Stain     Status: None   Collection Time: 03/09/21 10:46 AM   Specimen: Tracheal Aspirate; Respiratory  Result Value Ref Range Status   Specimen Description TRACHEAL ASPIRATE  Final   Special Requests NONE  Final   Gram Stain   Final    FEW WBC PRESENT, PREDOMINANTLY PMN FEW GRAM NEGATIVE  RODS Performed at [facility], [address], [place], [place] [address]    Culture ABUNDANT PSEUDOMONAS AERUGINOSA  Final   Report Status 03/15/2021 FINAL  Final   Organism ID, Bacteria PSEUDOMONAS AERUGINOSA  Final      Susceptibility   Pseudomonas aeruginosa - MIC*    CEFTAZIDIME 4 SENSITIVE Sensitive     CIPROFLOXACIN <=0.25 SENSITIVE Sensitive     GENTAMICIN <=1 SENSITIVE Sensitive     IMIPENEM 2 SENSITIVE Sensitive     PIP/TAZO 8 SENSITIVE Sensitive     CEFEPIME 2 SENSITIVE Sensitive     * ABUNDANT PSEUDOMONAS AERUGINOSA  Surgical pcr screen     Status: Abnormal   Collection Time: 03/11/21  7:26 AM   Specimen: Nasal Mucosa; Nasal Swab  Result Value Ref Range Status   MRSA, PCR POSITIVE (A) NEGATIVE Final    Comment: RESULT CALLED TO, READ BACK BY AND VERIFIED WITH: RN P.TRAVIS AT 0915 ON 03/11/2021 BY T.SAAD.    Staphylococcus aureus POSITIVE (A) NEGATIVE Final    Comment: (NOTE) The Xpert SA Assay (FDA approved for NASAL specimens in patients 69 years of age and older), is one component of a comprehensive surveillance program. It is not intended to diagnose infection nor to guide or monitor treatment. Performed at [facility], [address] [place], [place] [address]   Culture, blood (Routine X 2) w Reflex to ID Panel     Status: None   Collection Time: 03/13/21 11:15 AM   Specimen: BLOOD  Result Value Ref Range Status   Specimen Description BLOOD LEFT ANTECUBITAL  Final   Special Requests AEROBIC BOTTLE ONLY Blood Culture adequate volume  Final   Culture   Final    NO GROWTH 5 DAYS Performed at [facility], [address], [place], [place] [address]    Report Status 03/18/2021 FINAL  Final  Culture, blood (Routine X 2) w Reflex to ID Panel     Status: None   Collection Time: 03/13/21 11:15 AM   Specimen: BLOOD  Result Value Ref Range Status   Specimen Description BLOOD BLOOD LEFT FOREARM  Final   Special Requests AEROBIC BOTTLE ONLY  Blood Culture adequate volume  Final   Culture   Final    NO GROWTH 5 DAYS Performed at [facility], [address] [place], [place] [address]    Report Status 03/18/2021 FINAL  Final  Culture, Respiratory w Gram Stain     Status: None   Collection Time: 03/15/21 10:18 AM   Specimen: Tracheal Aspirate; Respiratory  Result Value Ref Range Status   Specimen Description TRACHEAL ASPIRATE  Final   Special Requests NONE  Final   Gram Stain   Final    ABUNDANT WBC PRESENT, PREDOMINANTLY PMN NO ORGANISMS SEEN Performed at [facility], [address], [place], [place]  [address]    Culture   Final    MODERATE PSEUDOMONAS AERUGINOSA MODERATE ACINETOBACTER CALCOACETICUS/BAUMANNII COMPLEX    Report Status 03/18/2021 FINAL  Final   Organism ID, Bacteria PSEUDOMONAS AERUGINOSA  Final   Organism ID, Bacteria ACINETOBACTER CALCOACETICUS/BAUMANNII COMPLEX  Final      Susceptibility   Acinetobacter calcoaceticus/baumannii complex - MIC*    CEFTAZIDIME 4 SENSITIVE Sensitive     CIPROFLOXACIN <=0.25 SENSITIVE Sensitive     GENTAMICIN <=1 SENSITIVE Sensitive     IMIPENEM <=0.25 SENSITIVE Sensitive     PIP/TAZO <=4 SENSITIVE Sensitive     TRIMETH/SULFA <=20 SENSITIVE Sensitive     AMPICILLIN/SULBACTAM <=2 SENSITIVE Sensitive     * MODERATE ACINETOBACTER CALCOACETICUS/BAUMANNII COMPLEX   Pseudomonas aeruginosa - MIC*    CEFTAZIDIME 4 SENSITIVE Sensitive     CIPROFLOXACIN <=0.25 SENSITIVE Sensitive     GENTAMICIN <=1 SENSITIVE Sensitive     IMIPENEM 2 SENSITIVE Sensitive     PIP/TAZO <=4 SENSITIVE Sensitive     CEFEPIME 4 SENSITIVE Sensitive     * MODERATE PSEUDOMONAS AERUGINOSA    Anti-infectives:  Anti-infectives (From admission, onward)    Start     Dose/Rate Route Frequency Ordered Stop   03/15/21 0400  vancomycin (VANCOCIN) IVPB 1000 mg/200 mL premix  Status:  Discontinued        1,000 mg 200 mL/hr over 60 Minutes Intravenous Every 8 hours 03/15/21 0125 03/17/21 0928    03/13/21 1200  ceFEPIme (MAXIPIME) 2 g in sodium chloride 0.9 % 100 mL IVPB        2 g 200 mL/hr over 30 Minutes Intravenous Every 8 hours 03/13/21 1103 03/20/21 0429   03/12/21 2200  vancomycin (VANCOREADY) IVPB 750 mg/150 mL  Status:  Discontinued        750 mg 150 mL/hr over 60 Minutes Intravenous Every 8 hours 03/12/21 1537 03/15/21 0125   03/12/21 0530  vancomycin (VANCOREADY) IVPB 750 mg/150 mL  Status:  Discontinued        750 mg 150 mL/hr over 60 Minutes Intravenous Every 8 hours 03/11/21 2145 03/12/21 1340   03/10/21 1400  piperacillin-tazobactam (ZOSYN) IVPB 3.375 g  Status:  Discontinued        3.375 g 12.5 mL/hr over 240 Minutes Intravenous Every 8 hours 03/10/21 0909 03/12/21 1340   03/10/21 1000  vancomycin (VANCOCIN) IVPB 1000 mg/200 mL premix  Status:  Discontinued        1,000 mg 200 mL/hr over 60 Minutes Intravenous Every 8 hours 03/10/21 0908 03/11/21 2145   03/06/21 1415  piperacillin-tazobactam (ZOSYN) IVPB 3.375 g        3.375 g 12.5 mL/hr over 240 Minutes Intravenous Every 8 hours 03/06/21 1324 03/10/21 0902   02/24/21 1200  vancomycin (VANCOCIN) IVPB 1000 mg/200 mL premix        1,000 mg 200 mL/hr over 60 Minutes Intravenous Every 8 hours 02/24/21 0934 03/02/21 2020   02/23/21 0200  vancomycin (VANCOREADY) IVPB 1500 mg/300 mL  Status:  Discontinued        1,500 mg 150 mL/hr over 120 Minutes Intravenous Every 8 hours 02/22/21 1931 02/24/21 0934   02/22/21 2100  vancomycin (VANCOCIN) 250 mg in sodium chloride 0.9 % 100 mL IVPB        250 mg 100 mL/hr over 60 Minutes Intravenous  Once 02/22/21 2012 02/22/21 2150   02/22/21 2030  vancomycin (VANCOCIN) 250 mg in sodium chloride 0.9 % 500 mL IVPB  Status:  Discontinued  250 mg 250 mL/hr over 120 Minutes Intravenous  Once 02/22/21 1931 02/22/21 2012   02/21/21 1800  vancomycin (VANCOREADY) IVPB 1250 mg/250 mL  Status:  Discontinued        1,250 mg 166.7 mL/hr over 90 Minutes Intravenous Every 8 hours 02/21/21  0851 02/22/21 1931   02/21/21 1000  ceFEPIme (MAXIPIME) 2 g in sodium chloride 0.9 % 100 mL IVPB        2 g 200 mL/hr over 30 Minutes Intravenous Every 8 hours 02/21/21 0851 03/02/21 1749   02/21/21 1000  vancomycin (VANCOREADY) IVPB 2000 mg/400 mL        2,000 mg 200 mL/hr over 120 Minutes Intravenous  Once 02/21/21 0851 02/21/21 1306       Best Practice/Protocols:  VTE Prophylaxis: Lovenox (full dose) Continous Sedation  Consults: Treatment Team:  Serena Colonel, MD Myrene Galas, MD    Studies:    Events:  Subjective:    Overnight Issues:   Objective:  Vital signs for last 24 hours: Temp:  [97.9 F (36.6 C)-98.7 F (37.1 C)] 98.7 F (37.1 C) (09/14 0400) Pulse Rate:  [76-133] 87 (09/14 0700) Resp:  [13-30] 21 (09/14 0700) BP: (84-149)/(57-96) 138/87 (09/14 0700) SpO2:  [89 %-98 %] 90 % (09/14 0700) FiO2 (%):  [35 %-80 %] 60 % (09/14 0327) Weight:  [63.2 kg] 63.2 kg (09/14 0458)  Hemodynamic parameters for last 24 hours:    Intake/Output from previous day: 09/13 0701 - 09/14 0700 In: 3008.9 [I.V.:592.1; NG/GT:1956.8] Out: 1400 [Urine:1400]  Intake/Output this shift: No intake/output data recorded.  Vent settings for last 24 hours: FiO2 (%):  [35 %-80 %] 60 %  Physical Exam:  General: no respiratory distress Neuro: does F/C HEENT/Neck: trach-clean, intact Resp: clear to auscultation bilaterally CVS: RRR GI: soft, NT, G tube in place Extremities: no edema  Results for orders placed or performed during the hospital encounter of 02/15/21 (from the past 24 hour(s))  Glucose, capillary     Status: Abnormal   Collection Time: 03/22/21  7:51 AM  Result Value Ref Range   Glucose-Capillary 115 (H) 70 - 99 mg/dL  Glucose, capillary     Status: Abnormal   Collection Time: 03/22/21 11:45 AM  Result Value Ref Range   Glucose-Capillary 110 (H) 70 - 99 mg/dL  Glucose, capillary     Status: Abnormal   Collection Time: 03/22/21  3:48 PM  Result Value Ref  Range   Glucose-Capillary 134 (H) 70 - 99 mg/dL  Glucose, capillary     Status: Abnormal   Collection Time: 03/22/21  7:26 PM  Result Value Ref Range   Glucose-Capillary 126 (H) 70 - 99 mg/dL  Glucose, capillary     Status: Abnormal   Collection Time: 03/22/21 11:24 PM  Result Value Ref Range   Glucose-Capillary 144 (H) 70 - 99 mg/dL  Glucose, capillary     Status: Abnormal   Collection Time: 03/23/21  3:23 AM  Result Value Ref Range   Glucose-Capillary 133 (H) 70 - 99 mg/dL  CBC with Differential/Platelet     Status: Abnormal   Collection Time: 03/23/21  4:50 AM  Result Value Ref Range   WBC 8.7 4.0 - 10.5 K/uL   RBC 3.56 (L) 4.22 - 5.81 MIL/uL   Hemoglobin 11.1 (L) 13.0 - 17.0 g/dL   HCT 88.4 (L) 16.6 - 06.3 %   MCV 96.3 80.0 - 100.0 fL   MCH 31.2 26.0 - 34.0 pg   MCHC 32.4 30.0 - 36.0 g/dL  RDW 15.9 (H) 11.5 - 15.5 %   Platelets 312 150 - 400 K/uL   nRBC 0.0 0.0 - 0.2 %   Neutrophils Relative % 66 %   Neutro Abs 5.8 1.7 - 7.7 K/uL   Lymphocytes Relative 23 %   Lymphs Abs 2.0 0.7 - 4.0 K/uL   Monocytes Relative 8 %   Monocytes Absolute 0.7 0.1 - 1.0 K/uL   Eosinophils Relative 2 %   Eosinophils Absolute 0.2 0.0 - 0.5 K/uL   Basophils Relative 1 %   Basophils Absolute 0.0 0.0 - 0.1 K/uL   Immature Granulocytes 0 %   Abs Immature Granulocytes 0.03 0.00 - 0.07 K/uL    Assessment & Plan: Present on Admission:  Traumatic brain injury (HCC)    LOS: 36 days   Additional comments:I reviewed the patient's new clinical lab test results. Marland Kitchen [facility]   SAH, 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 Conchita Paris, keppra x7d for sz ppx, repeat 4V Angio CT of neck 8/21 negative. 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  G1 L ICA injury - ASA  discontinued when starting eliquis for DVT (9/9) Facial laceration involving left eyelid - ENT c/s, Dr. Pollyann Kennedy, repaired 8/9 Skull fx extending ito L TMJ, L sphenoid sinus, R mastoid; R nasal bone fx  - ENT c/s, Dr. Pollyann Kennedy, repeat exam when more neurologically appropriate R calf DVT - eliquis Acute hypoxic ventilator dependent respiratory failure - doing well on HTC > 24h ID/PNA - 9/6 resp CX Pseud/Acinetobacter - cefepime end 9/11. Ciprodex drops for otitis externa per Dr. Pollyann Kennedy. CV/Hypertensive urgency - off esmolol,  metoprolol to 150 q8h, labetalol prn L clavicle and L scapula fx - non-op per Dr. [name] rash, scattered abrasions - local wound care Alcohol abuse - CIWA H/o tobacco use disorder  FEN - g tube replaced 9/13. Klon/sero to wean Precedex DVT - SCDs, R calf DVT - eliquis Foley - out, retaining some again - resume urecholine Dispo - ICU until Precedex off, TBI team therapies Critical Care Total Time*: 35 Minutes  Violeta Gelinas, MD, MPH, FACS Trauma & General Surgery Use [email or web address] to contact on call provider  03/23/2021  *Care during the described time interval was provided by me. I have reviewed this patient's available data, including medical history, events of note, physical examination and test results as part of my evaluation.
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
## 2021-03-23 NOTE — Progress Notes (Signed)
Pt I & O cathed for 700 cc urine. Will continue to scan and cath pt per orders.

## 2021-03-24 LAB — CBC WITH DIFFERENTIAL/PLATELET
Abs Immature Granulocytes: 0.01 10*3/uL (ref 0.00–0.07)
Basophils Absolute: 0 10*3/uL (ref 0.0–0.1)
Basophils Relative: 1 %
Eosinophils Absolute: 0.3 10*3/uL (ref 0.0–0.5)
Eosinophils Relative: 5 %
HCT: 36.1 % — ABNORMAL LOW (ref 39.0–52.0)
Hemoglobin: 11.1 g/dL — ABNORMAL LOW (ref 13.0–17.0)
Immature Granulocytes: 0 %
Lymphocytes Relative: 33 %
Lymphs Abs: 1.8 10*3/uL (ref 0.7–4.0)
MCH: 30.3 pg (ref 26.0–34.0)
MCHC: 30.7 g/dL (ref 30.0–36.0)
MCV: 98.6 fL (ref 80.0–100.0)
Monocytes Absolute: 0.5 10*3/uL (ref 0.1–1.0)
Monocytes Relative: 9 %
Neutro Abs: 2.9 10*3/uL (ref 1.7–7.7)
Neutrophils Relative %: 52 %
Platelets: 293 10*3/uL (ref 150–400)
RBC: 3.66 MIL/uL — ABNORMAL LOW (ref 4.22–5.81)
RDW: 15.8 % — ABNORMAL HIGH (ref 11.5–15.5)
WBC: 5.5 10*3/uL (ref 4.0–10.5)
nRBC: 0 % (ref 0.0–0.2)

## 2021-03-24 LAB — GLUCOSE, CAPILLARY
Glucose-Capillary: 126 mg/dL — ABNORMAL HIGH (ref 70–99)
Glucose-Capillary: 94 mg/dL (ref 70–99)
Glucose-Capillary: 101 mg/dL — ABNORMAL HIGH (ref 70–99)
Glucose-Capillary: 101 mg/dL — ABNORMAL HIGH (ref 70–99)
Glucose-Capillary: 95 mg/dL (ref 70–99)
Glucose-Capillary: 99 mg/dL (ref 70–99)

## 2021-03-24 LAB — BASIC METABOLIC PANEL
Anion gap: 9 (ref 5–15)
BUN: 23 mg/dL — ABNORMAL HIGH (ref 6–20)
CO2: 28 mmol/L (ref 22–32)
Calcium: 9.6 mg/dL (ref 8.9–10.3)
Chloride: 103 mmol/L (ref 98–111)
Creatinine, Ser: 0.4 mg/dL — ABNORMAL LOW (ref 0.61–1.24)
GFR, Estimated: >60 mL/min (ref >60)
Glucose, Bld: 110 mg/dL — ABNORMAL HIGH (ref 70–99)
Potassium: 4 mmol/L (ref 3.5–5.1)
Sodium: 140 mmol/L (ref 135–145)

## 2021-03-24 MED ORDER — PROSOURCE TF PO LIQD
90.0000 mL | Freq: Three times a day (TID) | ORAL | Status: DC
  Administered 2021-03-24 – 2021-03-30 (×18): 90 mL
  Filled 2021-03-24 (×21): qty 90

## 2021-03-24 MED ORDER — CHLORHEXIDINE GLUCONATE CLOTH 2 % EX PADS
6.0000 | MEDICATED_PAD | Freq: Every day | CUTANEOUS | Status: DC
  Administered 2021-03-24 – 2021-04-01 (×9): 6 via TOPICAL

## 2021-03-24 MED ORDER — PANTOPRAZOLE 2 MG/ML SUSPENSION
40.0000 mg | Freq: Every day | ORAL | Status: DC
  Administered 2021-03-25 – 2021-04-01 (×8): 40 mg
  Filled 2021-03-24 (×7): qty 20

## 2021-03-24 MED ORDER — OSMOLITE 1.5 CAL PO LIQD
1000.0000 mL | ORAL | Status: AC
  Administered 2021-03-25 – 2021-03-27 (×3): 1000 mL
  Filled 2021-03-24: qty 1000
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
## 2021-03-24 NOTE — Progress Notes (Signed)
Patient ID: Scott [name], male   DOB: [birth year], 35 y.o.   MRN: [id number] Follow up - Trauma Critical Care  Patient Details:    Scott [name] is an 35 y.o. male.  Lines/tubes : PICC Triple Lumen 02/20/21 PICC Right Basilic 39 cm 0 cm (Active)  Indication for Insertion or Continuance of Line Prolonged intravenous therapies 03/24/21 0800  Exposed Catheter (cm) 1 cm 03/07/21 1200  Site Assessment Clean;Dry;Intact 03/24/21 0800  Lumen #1 Status In-line blood sampling system in place;Blood return noted 03/24/21 0800  Lumen #2 Status Infusing 03/24/21 0800  Lumen #3 Status Saline locked 03/24/21 0800  Dressing Type Transparent;Securing device 03/24/21 0800  Dressing Status Clean;Dry;Intact 03/24/21 0800  Antimicrobial disc in place? Yes 03/24/21 0800  Safety Lock Not Applicable 03/23/21 2000  Line Care Connections checked and tightened 03/24/21 0800  Line Adjustment (NICU/IV Team Only) No 03/12/21 0800  Dressing Intervention New dressing;Dressing reinforced 03/20/21 0800  Dressing Change Due 03/26/21 03/24/21 0800     Gastrostomy/Enterostomy Gastrostomy 24 Fr. LLQ (Active)  Surrounding Skin Dry;Intact 03/24/21 0800  Tube Status Patent 03/24/21 0800  Dressing Status Clean;Dry;Intact 03/24/21 0800  Dressing Type Split gauze 03/24/21 0800  G Port Intake (mL) 60 ml 03/22/21 2000     Flatus Tube/Pouch (Active)  Daily care Skin around tube assessed 03/24/21 0800  Output (mL) 300 mL 03/24/21 0537  Intake (mL) 60 mL 03/22/21 2000     External Urinary Catheter (Active)  Collection Container Standard drainage bag 03/24/21 0800  Suction (Verified suction is between 40-80 mmHg) N/A (Patient has condom catheter) 03/23/21 2000  Securement Method Leg strap 03/23/21 2000  Site Assessment Clean;Intact 03/24/21 0800  Intervention Male External Urinary Catheter Replaced 03/18/21 0800  Output (mL) 800 mL 03/24/21 0537    Microbiology/Sepsis markers: Results for orders placed or performed  during the hospital encounter of 02/15/21  Resp Panel by RT-PCR (Flu A&B, Covid) Nasopharyngeal Swab     Status: None   Collection Time: 02/15/21  8:14 PM   Specimen: Nasopharyngeal Swab; Nasopharyngeal(NP) swabs in vial transport medium  Result Value Ref Range Status   SARS Coronavirus 2 by RT PCR NEGATIVE NEGATIVE Final    Comment: (NOTE) SARS-CoV-2 target nucleic acids are NOT DETECTED.  The SARS-CoV-2 RNA is generally detectable in upper respiratory specimens during the acute phase of infection. The lowest concentration of SARS-CoV-2 viral copies this assay can detect is 138 copies/mL. A negative result does not preclude SARS-Cov-2 infection and should not be used as the sole basis for treatment or other patient management decisions. A negative result may occur with  improper specimen collection/handling, submission of specimen other than nasopharyngeal swab, presence of viral mutation(s) within the areas targeted by this assay, and inadequate number of viral copies(<138 copies/mL). A negative result must be combined with clinical observations, patient history, and epidemiological information. The expected result is Negative.  Fact Sheet for Patients:  BloggerCourse.com  Fact Sheet for Healthcare Providers:  SeriousBroker.it  This test is no t yet approved or cleared by the [place] FDA and  has been authorized for detection and/or diagnosis of SARS-CoV-2 by FDA under an Emergency Use Authorization (EUA). This EUA will remain  in effect (meaning this test can be used) for the duration of the COVID-19 declaration under Section 564(b)(1) of the Act, 21 U.S.C.section 360bbb-3(b)(1), unless the authorization is terminated  or revoked sooner.       Influenza A by PCR NEGATIVE NEGATIVE Final   Influenza B by PCR NEGATIVE  NEGATIVE Final    Comment: (NOTE) The Xpert Xpress SARS-CoV-2/FLU/RSV plus assay is intended as an  aid in the diagnosis of influenza from Nasopharyngeal swab specimens and should not be used as a sole basis for treatment. Nasal washings and aspirates are unacceptable for Xpert Xpress SARS-CoV-2/FLU/RSV testing.  Fact Sheet for Patients: BloggerCourse.com  Fact Sheet for Healthcare Providers: SeriousBroker.it  This test is not yet approved or cleared by the [place] FDA and has been authorized for detection and/or diagnosis of SARS-CoV-2 by FDA under an Emergency Use Authorization (EUA). This EUA will remain in effect (meaning this test can be used) for the duration of the COVID-19 declaration under Section 564(b)(1) of the Act, 21 U.S.C. section 360bbb-3(b)(1), unless the authorization is terminated or revoked.  Performed at [facility], [address], [place], [place] [address]   MRSA Next Gen by PCR, Nasal     Status: None   Collection Time: 02/15/21  9:58 PM   Specimen: Nasal Mucosa; Nasal Swab  Result Value Ref Range Status   MRSA by PCR Next Gen NOT DETECTED NOT DETECTED Final    Comment: (NOTE) The GeneXpert MRSA Assay (FDA approved for NASAL specimens only), is one component of a comprehensive MRSA colonization surveillance program. It is not intended to diagnose MRSA infection nor to guide or monitor treatment for MRSA infections. Test performance is not FDA approved in patients less than 74 years old. Performed at [facility], [address] [place], [place] [address]   Culture, Respiratory w Gram Stain     Status: None   Collection Time: 02/17/21  2:04 PM   Specimen: Tracheal Aspirate; Respiratory  Result Value Ref Range Status   Specimen Description TRACHEAL ASPIRATE  Final   Special Requests NONE  Final   Gram Stain   Final    ABUNDANT WBC PRESENT,BOTH PMN AND MONONUCLEAR ABUNDANT GRAM NEGATIVE RODS MODERATE GRAM POSITIVE COCCI RARE GRAM POSITIVE RODS    Culture   Final    FEW Normal  respiratory flora-no Staph aureus or Pseudomonas seen Performed at [facility], [address] [place], [place] [address]    Report Status 02/19/2021 FINAL  Final  Culture, blood (routine x 2)     Status: None   Collection Time: 02/17/21  2:24 PM   Specimen: BLOOD  Result Value Ref Range Status   Specimen Description BLOOD BLOOD RIGHT FOREARM  Final   Special Requests   Final    BOTTLES DRAWN AEROBIC AND ANAEROBIC Blood Culture adequate volume   Culture   Final    NO GROWTH 5 DAYS Performed at [facility], [address] [place], [place] [address]    Report Status 02/22/2021 FINAL  Final  Culture, blood (routine x 2)     Status: None   Collection Time: 02/17/21  3:01 PM   Specimen: BLOOD  Result Value Ref Range Status   Specimen Description BLOOD LEFT ANTECUBITAL  Final   Special Requests   Final    BOTTLES DRAWN AEROBIC AND ANAEROBIC Blood Culture adequate volume   Culture   Final    NO GROWTH 5 DAYS Performed at [facility], [address], [place], [place] [address]    Report Status 02/22/2021 FINAL  Final  Culture, Respiratory w Gram Stain     Status: None   Collection Time: 02/20/21 12:49 PM   Specimen: Tracheal Aspirate; Respiratory  Result Value Ref Range Status   Specimen Description  TRACHEAL ASPIRATE  Final   Special Requests NONE  Final   Gram Stain   Final    MODERATE WBC PRESENT,BOTH PMN AND MONONUCLEAR ABUNDANT GRAM POSITIVE COCCI IN PAIRS ABUNDANT GRAM NEGATIVE RODS Performed at [facility], [address] [place], [place] [address]    Culture   Final    ABUNDANT METHICILLIN RESISTANT STAPHYLOCOCCUS AUREUS ABUNDANT PSEUDOMONAS AERUGINOSA    Report Status 02/23/2021 FINAL  Final   Organism ID, Bacteria METHICILLIN RESISTANT STAPHYLOCOCCUS AUREUS  Final   Organism ID, Bacteria PSEUDOMONAS AERUGINOSA  Final      Susceptibility   Methicillin resistant staphylococcus aureus - MIC*    CIPROFLOXACIN <=0.5 SENSITIVE Sensitive      ERYTHROMYCIN <=0.25 SENSITIVE Sensitive     GENTAMICIN <=0.5 SENSITIVE Sensitive     OXACILLIN >=4 RESISTANT Resistant     TETRACYCLINE <=1 SENSITIVE Sensitive     VANCOMYCIN 1 SENSITIVE Sensitive     TRIMETH/SULFA <=10 SENSITIVE Sensitive     CLINDAMYCIN <=0.25 SENSITIVE Sensitive     RIFAMPIN <=0.5 SENSITIVE Sensitive     Inducible Clindamycin NEGATIVE Sensitive     * ABUNDANT METHICILLIN RESISTANT STAPHYLOCOCCUS AUREUS   Pseudomonas aeruginosa - MIC*    CEFTAZIDIME 4 SENSITIVE Sensitive     CIPROFLOXACIN 0.5 SENSITIVE Sensitive     GENTAMICIN <=1 SENSITIVE Sensitive     IMIPENEM 2 SENSITIVE Sensitive     PIP/TAZO 8 SENSITIVE Sensitive     CEFEPIME 2 SENSITIVE Sensitive     * ABUNDANT PSEUDOMONAS AERUGINOSA  Monkeypox Virus DNA, Qualitative Real-Time PCR     Status: None   Collection Time: 02/23/21  1:42 AM   Specimen: Arm, Right; Sterile Swab  Result Value Ref Range Status   Orthopoxvirus DNA, QL PCR NOT DETECTED NOT DETECTED Final    Comment: (NOTE) Non-variola Orthopoxvirus DNA not detected by real-time PCR. Performed At: [facility] [address] [place], [place] [id number] Jolene Schimke MD XB:[id number]   Culture, Respiratory w Gram Stain     Status: None   Collection Time: 02/23/21 10:05 AM   Specimen: Tracheal Aspirate; Respiratory  Result Value Ref Range Status   Specimen Description TRACHEAL ASPIRATE  Final   Special Requests NONE  Final   Gram Stain   Final    ABUNDANT WBC PRESENT, PREDOMINANTLY PMN FEW GRAM NEGATIVE RODS RARE GRAM POSITIVE COCCI Performed at [facility], [address] [place], [place] [address]    Culture   Final    MODERATE PSEUDOMONAS AERUGINOSA RARE METHICILLIN RESISTANT STAPHYLOCOCCUS AUREUS    Report Status 02/26/2021 FINAL  Final   Organism ID, Bacteria PSEUDOMONAS AERUGINOSA  Final   Organism ID, Bacteria METHICILLIN RESISTANT STAPHYLOCOCCUS AUREUS  Final      Susceptibility   Methicillin resistant  staphylococcus aureus - MIC*    CIPROFLOXACIN <=0.5 SENSITIVE Sensitive     ERYTHROMYCIN <=0.25 SENSITIVE Sensitive     GENTAMICIN <=0.5 SENSITIVE Sensitive     OXACILLIN >=4 RESISTANT Resistant     TETRACYCLINE <=1 SENSITIVE Sensitive     VANCOMYCIN <=0.5 SENSITIVE Sensitive     TRIMETH/SULFA <=10 SENSITIVE Sensitive     CLINDAMYCIN <=0.25 SENSITIVE Sensitive     RIFAMPIN <=0.5 SENSITIVE Sensitive     Inducible Clindamycin NEGATIVE Sensitive     * RARE METHICILLIN RESISTANT STAPHYLOCOCCUS AUREUS   Pseudomonas aeruginosa - MIC*    CEFTAZIDIME 4 SENSITIVE Sensitive     CIPROFLOXACIN <=0.25 SENSITIVE Sensitive     GENTAMICIN <=1 SENSITIVE Sensitive  IMIPENEM 1 SENSITIVE Sensitive     PIP/TAZO 8 SENSITIVE Sensitive     CEFEPIME 2 SENSITIVE Sensitive     * MODERATE PSEUDOMONAS AERUGINOSA  Culture, blood (Routine X 2) w Reflex to ID Panel     Status: None   Collection Time: 02/24/21 10:53 AM   Specimen: BLOOD RIGHT HAND  Result Value Ref Range Status   Specimen Description BLOOD RIGHT HAND  Final   Special Requests   Final    BOTTLES DRAWN AEROBIC AND ANAEROBIC Blood Culture adequate volume   Culture   Final    NO GROWTH 5 DAYS Performed at [facility], [address], [place], [place] [address]    Report Status 03/01/2021 FINAL  Final  Culture, blood (Routine X 2) w Reflex to ID Panel     Status: None   Collection Time: 02/24/21 10:58 AM   Specimen: BLOOD LEFT HAND  Result Value Ref Range Status   Specimen Description BLOOD LEFT HAND  Final   Special Requests   Final    BOTTLES DRAWN AEROBIC AND ANAEROBIC Blood Culture adequate volume   Culture   Final    NO GROWTH 5 DAYS Performed at [facility], [address] [place], [place] [address]    Report Status 03/01/2021 FINAL  Final  SARS CORONAVIRUS 2 (TAT 6-24 HRS) Nasopharyngeal Nasopharyngeal Swab     Status: None   Collection Time: 02/25/21  9:20 AM   Specimen: Nasopharyngeal Swab  Result Value Ref Range  Status   SARS Coronavirus 2 NEGATIVE NEGATIVE Final    Comment: (NOTE) SARS-CoV-2 target nucleic acids are NOT DETECTED.  The SARS-CoV-2 RNA is generally detectable in upper and lower respiratory specimens during the acute phase of infection. Negative results do not preclude SARS-CoV-2 infection, do not rule out co-infections with other pathogens, and should not be used as the sole basis for treatment or other patient management decisions. Negative results must be combined with clinical observations, patient history, and epidemiological information. The expected result is Negative.  Fact Sheet for Patients: HairSlick.no  Fact Sheet for Healthcare Providers: quierodirigir.com  This test is not yet approved or cleared by the [place] FDA and  has been authorized for detection and/or diagnosis of SARS-CoV-2 by FDA under an Emergency Use Authorization (EUA). This EUA will remain  in effect (meaning this test can be used) for the duration of the COVID-19 declaration under Se ction 564(b)(1) of the Act, 21 U.S.C. section 360bbb-3(b)(1), unless the authorization is terminated or revoked sooner.  Performed at [facility], [address], [place], [place] [address]   Culture, Respiratory w Gram Stain     Status: None   Collection Time: 03/04/21 10:37 AM   Specimen: Tracheal Aspirate; Respiratory  Result Value Ref Range Status   Specimen Description TRACHEAL ASPIRATE  Final   Special Requests NONE  Final   Gram Stain   Final    FEW SQUAMOUS EPITHELIAL CELLS PRESENT MODERATE WBC PRESENT, PREDOMINANTLY PMN MODERATE GRAM NEGATIVE RODS    Culture   Final    FEW PSEUDOMONAS AERUGINOSA NO STAPHYLOCOCCUS AUREUS ISOLATED Performed at [facility], [address] [place], [place] [address]    Report Status 03/07/2021 FINAL  Final   Organism ID, Bacteria PSEUDOMONAS AERUGINOSA  Final      Susceptibility    Pseudomonas aeruginosa - MIC*    CEFTAZIDIME 16 INTERMEDIATE Intermediate     CIPROFLOXACIN <=0.25 SENSITIVE Sensitive  GENTAMICIN <=1 SENSITIVE Sensitive     IMIPENEM 2 SENSITIVE Sensitive     * FEW PSEUDOMONAS AERUGINOSA  Culture, Respiratory w Gram Stain     Status: None   Collection Time: 03/06/21  1:56 PM   Specimen: Tracheal Aspirate; Respiratory  Result Value Ref Range Status   Specimen Description TRACHEAL ASPIRATE  Final   Special Requests NONE  Final   Gram Stain   Final    MODERATE SQUAMOUS EPITHELIAL CELLS PRESENT MODERATE WBC PRESENT, PREDOMINANTLY PMN FEW GRAM NEGATIVE RODS FEW GRAM POSITIVE COCCI Performed at [facility], [address], [place], [place] [address]    Culture   Final    ABUNDANT PSEUDOMONAS AERUGINOSA ABUNDANT METHICILLIN RESISTANT STAPHYLOCOCCUS AUREUS    Report Status 03/09/2021 FINAL  Final   Organism ID, Bacteria PSEUDOMONAS AERUGINOSA  Final   Organism ID, Bacteria METHICILLIN RESISTANT STAPHYLOCOCCUS AUREUS  Final      Susceptibility   Methicillin resistant staphylococcus aureus - MIC*    CIPROFLOXACIN <=0.5 SENSITIVE Sensitive     ERYTHROMYCIN <=0.25 SENSITIVE Sensitive     GENTAMICIN <=0.5 SENSITIVE Sensitive     OXACILLIN >=4 RESISTANT Resistant     TETRACYCLINE <=1 SENSITIVE Sensitive     VANCOMYCIN <=0.5 SENSITIVE Sensitive     TRIMETH/SULFA <=10 SENSITIVE Sensitive     CLINDAMYCIN <=0.25 SENSITIVE Sensitive     RIFAMPIN <=0.5 SENSITIVE Sensitive     Inducible Clindamycin NEGATIVE Sensitive     * ABUNDANT METHICILLIN RESISTANT STAPHYLOCOCCUS AUREUS   Pseudomonas aeruginosa - MIC*    CEFTAZIDIME 2 SENSITIVE Sensitive     CIPROFLOXACIN <=0.25 SENSITIVE Sensitive     GENTAMICIN <=1 SENSITIVE Sensitive     IMIPENEM 0.5 SENSITIVE Sensitive     PIP/TAZO <=4 SENSITIVE Sensitive     CEFEPIME 2 SENSITIVE Sensitive     * ABUNDANT PSEUDOMONAS AERUGINOSA  Aerobic Culture w Gram Stain (superficial specimen)     Status: None    Collection Time: 03/06/21  3:13 PM   Specimen: Wound  Result Value Ref Range Status   Specimen Description WOUND  Final   Special Requests EAR RIGHT  Final   Gram Stain   Final    ABUNDANT WBC PRESENT,BOTH PMN AND MONONUCLEAR RARE SQUAMOUS EPITHELIAL CELLS PRESENT ABUNDANT GRAM NEGATIVE RODS FEW GRAM VARIABLE ROD FEW GRAM POSITIVE COCCI FEW YEAST Performed at [facility], [address], [place], [place] [address]    Culture   Final    MODERATE ENTEROBACTER CLOACAE FEW KLEBSIELLA PNEUMONIAE    Report Status 03/09/2021 FINAL  Final   Organism ID, Bacteria ENTEROBACTER CLOACAE  Final   Organism ID, Bacteria KLEBSIELLA PNEUMONIAE  Final      Susceptibility   Enterobacter cloacae - MIC*    CEFAZOLIN >=64 RESISTANT Resistant     CEFEPIME <=0.12 SENSITIVE Sensitive     CEFTAZIDIME <=1 SENSITIVE Sensitive     CIPROFLOXACIN <=0.25 SENSITIVE Sensitive     GENTAMICIN <=1 SENSITIVE Sensitive     IMIPENEM <=0.25 SENSITIVE Sensitive     TRIMETH/SULFA <=20 SENSITIVE Sensitive     PIP/TAZO <=4 SENSITIVE Sensitive     * MODERATE ENTEROBACTER CLOACAE   Klebsiella pneumoniae - MIC*    AMPICILLIN >=32 RESISTANT Resistant     CEFAZOLIN <=4 SENSITIVE Sensitive     CEFEPIME <=0.12 SENSITIVE Sensitive     CEFTAZIDIME <=1 SENSITIVE Sensitive     CEFTRIAXONE <=0.25 SENSITIVE Sensitive     CIPROFLOXACIN <=0.25 SENSITIVE Sensitive     GENTAMICIN <=1  SENSITIVE Sensitive     IMIPENEM <=0.25 SENSITIVE Sensitive     TRIMETH/SULFA <=20 SENSITIVE Sensitive     AMPICILLIN/SULBACTAM 4 SENSITIVE Sensitive     PIP/TAZO <=4 SENSITIVE Sensitive     * FEW KLEBSIELLA PNEUMONIAE  Culture, Respiratory w Gram Stain     Status: None   Collection Time: 03/09/21 10:46 AM   Specimen: Tracheal Aspirate; Respiratory  Result Value Ref Range Status   Specimen Description TRACHEAL ASPIRATE  Final   Special Requests NONE  Final   Gram Stain   Final    FEW WBC PRESENT, PREDOMINANTLY PMN FEW GRAM NEGATIVE  RODS Performed at [facility], [address], [place], [place] [address]    Culture ABUNDANT PSEUDOMONAS AERUGINOSA  Final   Report Status 03/15/2021 FINAL  Final   Organism ID, Bacteria PSEUDOMONAS AERUGINOSA  Final      Susceptibility   Pseudomonas aeruginosa - MIC*    CEFTAZIDIME 4 SENSITIVE Sensitive     CIPROFLOXACIN <=0.25 SENSITIVE Sensitive     GENTAMICIN <=1 SENSITIVE Sensitive     IMIPENEM 2 SENSITIVE Sensitive     PIP/TAZO 8 SENSITIVE Sensitive     CEFEPIME 2 SENSITIVE Sensitive     * ABUNDANT PSEUDOMONAS AERUGINOSA  Surgical pcr screen     Status: Abnormal   Collection Time: 03/11/21  7:26 AM   Specimen: Nasal Mucosa; Nasal Swab  Result Value Ref Range Status   MRSA, PCR POSITIVE (A) NEGATIVE Final    Comment: RESULT CALLED TO, READ BACK BY AND VERIFIED WITH: RN P.TRAVIS AT 0915 ON 03/11/2021 BY T.SAAD.    Staphylococcus aureus POSITIVE (A) NEGATIVE Final    Comment: (NOTE) The Xpert SA Assay (FDA approved for NASAL specimens in patients 17 years of age and older), is one component of a comprehensive surveillance program. It is not intended to diagnose infection nor to guide or monitor treatment. Performed at [facility], [address] [place], [place] [address]   Culture, blood (Routine X 2) w Reflex to ID Panel     Status: None   Collection Time: 03/13/21 11:15 AM   Specimen: BLOOD  Result Value Ref Range Status   Specimen Description BLOOD LEFT ANTECUBITAL  Final   Special Requests AEROBIC BOTTLE ONLY Blood Culture adequate volume  Final   Culture   Final    NO GROWTH 5 DAYS Performed at [facility], [address] [place], [place] [address]    Report Status 03/18/2021 FINAL  Final  Culture, blood (Routine X 2) w Reflex to ID Panel     Status: None   Collection Time: 03/13/21 11:15 AM   Specimen: BLOOD  Result Value Ref Range Status   Specimen Description BLOOD BLOOD LEFT FOREARM  Final   Special Requests AEROBIC BOTTLE ONLY  Blood Culture adequate volume  Final   Culture   Final    NO GROWTH 5 DAYS Performed at [facility], [address], [place], [place] [address]    Report Status 03/18/2021 FINAL  Final  Culture, Respiratory w Gram Stain     Status: None   Collection Time: 03/15/21 10:18 AM   Specimen: Tracheal Aspirate; Respiratory  Result Value Ref Range Status   Specimen Description TRACHEAL ASPIRATE  Final   Special Requests NONE  Final   Gram Stain   Final    ABUNDANT WBC PRESENT, PREDOMINANTLY PMN NO ORGANISMS SEEN Performed at [facility], [address], [place], [place]  [address]    Culture   Final    MODERATE PSEUDOMONAS AERUGINOSA MODERATE ACINETOBACTER CALCOACETICUS/BAUMANNII COMPLEX    Report Status 03/18/2021 FINAL  Final   Organism ID, Bacteria PSEUDOMONAS AERUGINOSA  Final   Organism ID, Bacteria ACINETOBACTER CALCOACETICUS/BAUMANNII COMPLEX  Final      Susceptibility   Acinetobacter calcoaceticus/baumannii complex - MIC*    CEFTAZIDIME 4 SENSITIVE Sensitive     CIPROFLOXACIN <=0.25 SENSITIVE Sensitive     GENTAMICIN <=1 SENSITIVE Sensitive     IMIPENEM <=0.25 SENSITIVE Sensitive     PIP/TAZO <=4 SENSITIVE Sensitive     TRIMETH/SULFA <=20 SENSITIVE Sensitive     AMPICILLIN/SULBACTAM <=2 SENSITIVE Sensitive     * MODERATE ACINETOBACTER CALCOACETICUS/BAUMANNII COMPLEX   Pseudomonas aeruginosa - MIC*    CEFTAZIDIME 4 SENSITIVE Sensitive     CIPROFLOXACIN <=0.25 SENSITIVE Sensitive     GENTAMICIN <=1 SENSITIVE Sensitive     IMIPENEM 2 SENSITIVE Sensitive     PIP/TAZO <=4 SENSITIVE Sensitive     CEFEPIME 4 SENSITIVE Sensitive     * MODERATE PSEUDOMONAS AERUGINOSA    Anti-infectives:  Anti-infectives (From admission, onward)    Start     Dose/Rate Route Frequency Ordered Stop   03/15/21 0400  vancomycin (VANCOCIN) IVPB 1000 mg/200 mL premix  Status:  Discontinued        1,000 mg 200 mL/hr over 60 Minutes Intravenous Every 8 hours 03/15/21 0125 03/17/21 0928    03/13/21 1200  ceFEPIme (MAXIPIME) 2 g in sodium chloride 0.9 % 100 mL IVPB        2 g 200 mL/hr over 30 Minutes Intravenous Every 8 hours 03/13/21 1103 03/20/21 0429   03/12/21 2200  vancomycin (VANCOREADY) IVPB 750 mg/150 mL  Status:  Discontinued        750 mg 150 mL/hr over 60 Minutes Intravenous Every 8 hours 03/12/21 1537 03/15/21 0125   03/12/21 0530  vancomycin (VANCOREADY) IVPB 750 mg/150 mL  Status:  Discontinued        750 mg 150 mL/hr over 60 Minutes Intravenous Every 8 hours 03/11/21 2145 03/12/21 1340   03/10/21 1400  piperacillin-tazobactam (ZOSYN) IVPB 3.375 g  Status:  Discontinued        3.375 g 12.5 mL/hr over 240 Minutes Intravenous Every 8 hours 03/10/21 0909 03/12/21 1340   03/10/21 1000  vancomycin (VANCOCIN) IVPB 1000 mg/200 mL premix  Status:  Discontinued        1,000 mg 200 mL/hr over 60 Minutes Intravenous Every 8 hours 03/10/21 0908 03/11/21 2145   03/06/21 1415  piperacillin-tazobactam (ZOSYN) IVPB 3.375 g        3.375 g 12.5 mL/hr over 240 Minutes Intravenous Every 8 hours 03/06/21 1324 03/10/21 0902   02/24/21 1200  vancomycin (VANCOCIN) IVPB 1000 mg/200 mL premix        1,000 mg 200 mL/hr over 60 Minutes Intravenous Every 8 hours 02/24/21 0934 03/02/21 2020   02/23/21 0200  vancomycin (VANCOREADY) IVPB 1500 mg/300 mL  Status:  Discontinued        1,500 mg 150 mL/hr over 120 Minutes Intravenous Every 8 hours 02/22/21 1931 02/24/21 0934   02/22/21 2100  vancomycin (VANCOCIN) 250 mg in sodium chloride 0.9 % 100 mL IVPB        250 mg 100 mL/hr over 60 Minutes Intravenous  Once 02/22/21 2012 02/22/21 2150   02/22/21 2030  vancomycin (VANCOCIN) 250 mg in sodium chloride 0.9 % 500 mL IVPB  Status:  Discontinued  250 mg 250 mL/hr over 120 Minutes Intravenous  Once 02/22/21 1931 02/22/21 2012   02/21/21 1800  vancomycin (VANCOREADY) IVPB 1250 mg/250 mL  Status:  Discontinued        1,250 mg 166.7 mL/hr over 90 Minutes Intravenous Every 8 hours 02/21/21  0851 02/22/21 1931   02/21/21 1000  ceFEPIme (MAXIPIME) 2 g in sodium chloride 0.9 % 100 mL IVPB        2 g 200 mL/hr over 30 Minutes Intravenous Every 8 hours 02/21/21 0851 03/02/21 1749   02/21/21 1000  vancomycin (VANCOREADY) IVPB 2000 mg/400 mL        2,000 mg 200 mL/hr over 120 Minutes Intravenous  Once 02/21/21 0277 02/21/21 1306        Consults: Treatment Team:  Myrene Galas, MD    Studies:    Events:  Subjective:    Overnight Issues:   Objective:  Vital signs for last 24 hours: Temp:  [98 F (36.7 C)-98.4 F (36.9 C)] 98 F (36.7 C) (09/15 0400) Pulse Rate:  [69-94] 69 (09/15 0800) Resp:  [13-22] 14 (09/15 0800) BP: (89-147)/(56-108) 97/60 (09/15 0800) SpO2:  [91 %-97 %] 97 % (09/15 0800) FiO2 (%):  [40 %-60 %] 40 % (09/14 2000) Weight:  [63.2 kg] 63.2 kg (09/15 0500)  Hemodynamic parameters for last 24 hours:    Intake/Output from previous day: 09/14 0701 - 09/15 0700 In: 2906.6 [I.V.:556.4; NG/GT:2350.3] Out: 2250 [Urine:1950; Stool:300]  Intake/Output this shift: Total I/O In: 85.9 [I.V.:10.9; NG/GT:75] Out: -   Vent settings for last 24 hours: FiO2 (%):  [40 %-60 %] 40 %  Physical Exam:  General: alert and no respiratory distress Neuro: follows commands HEENT/Neck: trach out, site dressed Resp: clear to auscultation bilaterally CVS: RRR GI: soft, NT, G tube in place Extremities: no edema  Results for orders placed or performed during the hospital encounter of 02/15/21 (from the past 24 hour(s))  Glucose, capillary     Status: Abnormal   Collection Time: 03/23/21 12:00 PM  Result Value Ref Range   Glucose-Capillary 118 (H) 70 - 99 mg/dL  Glucose, capillary     Status: Abnormal   Collection Time: 03/23/21  4:16 PM  Result Value Ref Range   Glucose-Capillary 107 (H) 70 - 99 mg/dL  Glucose, capillary     Status: Abnormal   Collection Time: 03/23/21  7:28 PM  Result Value Ref Range   Glucose-Capillary 117 (H) 70 - 99 mg/dL   Glucose, capillary     Status: Abnormal   Collection Time: 03/23/21 11:20 PM  Result Value Ref Range   Glucose-Capillary 119 (H) 70 - 99 mg/dL  Glucose, capillary     Status: Abnormal   Collection Time: 03/24/21  3:29 AM  Result Value Ref Range   Glucose-Capillary 126 (H) 70 - 99 mg/dL  CBC with Differential/Platelet     Status: Abnormal   Collection Time: 03/24/21  5:43 AM  Result Value Ref Range   WBC 5.5 4.0 - 10.5 K/uL   RBC 3.66 (L) 4.22 - 5.81 MIL/uL   Hemoglobin 11.1 (L) 13.0 - 17.0 g/dL   HCT 41.2 (L) 87.8 - 67.6 %   MCV 98.6 80.0 - 100.0 fL   MCH 30.3 26.0 - 34.0 pg   MCHC 30.7 30.0 - 36.0 g/dL   RDW 72.0 (H) 94.7 - 09.6 %   Platelets 293 150 - 400 K/uL   nRBC 0.0 0.0 - 0.2 %   Neutrophils Relative % 52 %  Neutro Abs 2.9 1.7 - 7.7 K/uL   Lymphocytes Relative 33 %   Lymphs Abs 1.8 0.7 - 4.0 K/uL   Monocytes Relative 9 %   Monocytes Absolute 0.5 0.1 - 1.0 K/uL   Eosinophils Relative 5 %   Eosinophils Absolute 0.3 0.0 - 0.5 K/uL   Basophils Relative 1 %   Basophils Absolute 0.0 0.0 - 0.1 K/uL   Immature Granulocytes 0 %   Abs Immature Granulocytes 0.01 0.00 - 0.07 K/uL  Glucose, capillary     Status: None   Collection Time: 03/24/21  8:19 AM  Result Value Ref Range   Glucose-Capillary 94 70 - 99 mg/dL    Assessment & Plan: Present on Admission:  Traumatic brain injury (HCC)    LOS: 37 days   Additional comments:I reviewed the patient's new clinical lab test results. Marland [facility]   SAH, 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 Conchita Paris, keppra x7d for sz ppx, repeat 4V Angio CT of neck 8/21 negative. 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  G1 L ICA injury - ASA 325mg  discontinued when starting eliquis for DVT (9/9) Facial laceration involving left eyelid - ENT c/s, Dr. 07-10-1989, repaired 8/9 Skull fx extending ito L TMJ, L sphenoid sinus, R mastoid; R nasal bone fx - ENT c/s, Dr. 10/9, repeat exam when more neurologically  appropriate R calf DVT - eliquis Acute hypoxic ventilator dependent respiratory failure - doing well on HTC > 24h ID/PNA - 9/6 resp CX Pseud/Acinetobacter - cefepime end 9/11. Ciprodex drops for otitis externa per Dr. 11/11. CV/Hypertensive urgency - metoprolol 150 q8h L clavicle and L scapula fx - non-op per Dr. Pollyann [address] rash, scattered abrasions - local wound care Alcohol abuse - CIWA H/o tobacco use disorder  FEN - g tube replaced 9/13. Clonidine protocol to wean Precedex DVT - SCDs, R calf DVT - eliquis Foley - out, retaining some again - resumed urecholine 9/14 Dispo - ICU until Precedex off, TBI team therapies, plan eventual CIR Critical Care Total Time*: 33 Minutes  10/14, MD, MPH, FACS Trauma & General Surgery Use [email or web address] to contact on call provider  03/24/2021  *Care during the described time interval was provided by me. I have reviewed this patient's available data, including medical history, events of note, physical examination and test results as part of my evaluation.

## 2021-03-24 NOTE — Progress Notes (Signed)
Inpatient Rehabilitation Admissions Coordinator   I contacted girlfriend, April, by phone to request she provide me with pt's current insurance card so I can begin Auth for a CIR admit when he is medically appropriate.  Ottie Glazier, RN, MSN Rehab Admissions Coordinator [phone] 03/24/2021 3:16 PM
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
## 2021-03-24 NOTE — Progress Notes (Signed)
Stoma care done. ?

## 2021-03-25 LAB — CBC WITH DIFFERENTIAL/PLATELET
Abs Immature Granulocytes: 0.02 10*3/uL (ref 0.00–0.07)
Basophils Absolute: 0.1 10*3/uL (ref 0.0–0.1)
Basophils Relative: 1 %
Eosinophils Absolute: 0.2 10*3/uL (ref 0.0–0.5)
Eosinophils Relative: 4 %
HCT: 34.6 % — ABNORMAL LOW (ref 39.0–52.0)
Hemoglobin: 10.9 g/dL — ABNORMAL LOW (ref 13.0–17.0)
Immature Granulocytes: 0 %
Lymphocytes Relative: 29 %
Lymphs Abs: 1.7 10*3/uL (ref 0.7–4.0)
MCH: 30.4 pg (ref 26.0–34.0)
MCHC: 31.5 g/dL (ref 30.0–36.0)
MCV: 96.6 fL (ref 80.0–100.0)
Monocytes Absolute: 0.5 10*3/uL (ref 0.1–1.0)
Monocytes Relative: 8 %
Neutro Abs: 3.4 10*3/uL (ref 1.7–7.7)
Neutrophils Relative %: 58 %
Platelets: 276 10*3/uL (ref 150–400)
RBC: 3.58 MIL/uL — ABNORMAL LOW (ref 4.22–5.81)
RDW: 15.3 % (ref 11.5–15.5)
WBC: 6 10*3/uL (ref 4.0–10.5)
nRBC: 0 % (ref 0.0–0.2)

## 2021-03-25 LAB — GLUCOSE, CAPILLARY
Glucose-Capillary: 81 mg/dL (ref 70–99)
Glucose-Capillary: 124 mg/dL — ABNORMAL HIGH (ref 70–99)
Glucose-Capillary: 110 mg/dL — ABNORMAL HIGH (ref 70–99)
Glucose-Capillary: 120 mg/dL — ABNORMAL HIGH (ref 70–99)
Glucose-Capillary: 111 mg/dL — ABNORMAL HIGH (ref 70–99)
Glucose-Capillary: 125 mg/dL — ABNORMAL HIGH (ref 70–99)

## 2021-03-25 LAB — BASIC METABOLIC PANEL
Anion gap: 8 (ref 5–15)
BUN: 26 mg/dL — ABNORMAL HIGH (ref 6–20)
CO2: 29 mmol/L (ref 22–32)
Calcium: 9.5 mg/dL (ref 8.9–10.3)
Chloride: 100 mmol/L (ref 98–111)
Creatinine, Ser: 0.43 mg/dL — ABNORMAL LOW (ref 0.61–1.24)
GFR, Estimated: >60 mL/min (ref >60)
Glucose, Bld: 84 mg/dL (ref 70–99)
Potassium: 4 mmol/L (ref 3.5–5.1)
Sodium: 137 mmol/L (ref 135–145)

## 2021-03-25 LAB — MAGNESIUM: Magnesium: 2 mg/dL (ref 1.7–2.4)

## 2021-03-25 MED ORDER — OXYCODONE HCL 5 MG PO TABS
15.0000 mg | ORAL_TABLET | ORAL | Status: DC
Start: 2021-03-25 — End: 2021-03-26
  Administered 2021-03-25 – 2021-03-26 (×6): 15 mg
  Filled 2021-03-25 (×6): qty 3

## 2021-03-25 MED ORDER — CLONIDINE HCL 0.1 MG PO TABS
0.1000 mg | ORAL_TABLET | Freq: Two times a day (BID) | ORAL | Status: AC
  Administered 2021-03-27 (×2): 0.1 mg
  Filled 2021-03-25 (×2): qty 1

## 2021-03-25 MED ORDER — CLONIDINE HCL 0.2 MG PO TABS: 0.3000 mg | ORAL_TABLET | Freq: Every day | ORAL | Status: DC

## 2021-03-25 MED ORDER — CLONIDINE HCL 0.2 MG PO TABS
0.2000 mg | ORAL_TABLET | Freq: Every day | ORAL | Status: AC
  Administered 2021-03-28: 0.2 mg
  Filled 2021-03-25: qty 1

## 2021-03-25 MED ORDER — CLONIDINE HCL 0.2 MG PO TABS: 0.2000 mg | ORAL_TABLET | Freq: Every day | ORAL | Status: DC

## 2021-03-25 MED ORDER — CLONIDINE HCL 0.2 MG PO TABS: 0.3000 mg | ORAL_TABLET | Freq: Three times a day (TID) | ORAL | Status: DC

## 2021-03-25 MED ORDER — CLONIDINE HCL 0.2 MG PO TABS
0.2000 mg | ORAL_TABLET | Freq: Four times a day (QID) | ORAL | Status: AC
  Administered 2021-03-25 (×3): 0.2 mg
  Filled 2021-03-25 (×4): qty 1

## 2021-03-25 MED ORDER — CLONIDINE HCL 0.1 MG PO TABS: 0.1000 mg | ORAL_TABLET | Freq: Two times a day (BID) | ORAL | Status: DC

## 2021-03-25 MED ORDER — CLONIDINE HCL 0.1 MG PO TABS: 0.1000 mg | ORAL_TABLET | Freq: Four times a day (QID) | ORAL | Status: DC

## 2021-03-25 MED ORDER — CLONIDINE HCL 0.2 MG PO TABS: 0.3000 mg | ORAL_TABLET | Freq: Two times a day (BID) | ORAL | Status: DC

## 2021-03-25 MED ORDER — CLONIDINE HCL 0.1 MG PO TABS
0.1000 mg | ORAL_TABLET | Freq: Four times a day (QID) | ORAL | Status: AC
  Administered 2021-03-26 – 2021-03-27 (×4): 0.1 mg
  Filled 2021-03-25 (×4): qty 1

## 2021-03-25 MED ORDER — CLONIDINE HCL 0.2 MG PO TABS: 0.2000 mg | ORAL_TABLET | Freq: Four times a day (QID) | ORAL | Status: DC

## 2021-03-25 NOTE — Plan of Care (Signed)
  Problem: Acute Rehab PT Goals(only PT should resolve) Goal: Pt will Roll Supine to Side Flowsheets (Taken 03/25/2021 1328) Pt will Roll Supine to Side: with supervision Goal: Pt/caregiver will Perform Home Exercise Program Flowsheets (Taken 03/25/2021 1328) Pt/caregiver will Perform Home Exercise Program:  For improved balance  With Supervision, verbal cues required/provided Goal: PT Additional Goal #1 Outcome: Completed/Met   Problem: Acute Rehab PT Goals(only PT should resolve) Goal: Pt Will Go Supine/Side To Sit Flowsheets (Taken 03/25/2021 1330) Pt will go Supine/Side to Sit: with min guard assist Goal: Patient Will Transfer Sit To/From Stand Flowsheets (Taken 03/25/2021 1330) Patient will transfer sit to/from stand: with minimal assist Goal: Pt Will Perform Standing Balance Or Pre-Gait Flowsheets (Taken 03/25/2021 1330) Pt will perform standing balance or pre-gait:  with min guard assist  3- 5 min  with unilateral UE support  with no UE support Goal: Pt Will Ambulate Flowsheets (Taken 03/25/2021 1330) Pt will Ambulate:  > 125 feet  with least restrictive assistive device  with minimal assist  Goals updated today as above.  Magda Kiel, [place] TAEW[place]:[phone] Office:[phone] 03/25/2021
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
## 2021-03-25 NOTE — Progress Notes (Signed)
Stoma care done. ?

## 2021-03-25 NOTE — Progress Notes (Signed)
Inpatient Rehab Admissions Coordinator:   Pt. Continues on IV Haldol and IV fentanyl, though tapering off. Not yet ready for CIR at this time. CIR will follow up on Monday and open a case with insurance if Pt. Appears medically ready at that time.   Megan Salon, MS, CCC-SLP Rehab Admissions Coordinator  [phone] (celll) [phone] (office)

## 2021-03-25 NOTE — Progress Notes (Signed)
Trauma/Critical Care Follow Up Note  Subjective:    Overnight Issues:   Objective:  Vital signs for last 24 hours: Temp:  [94.5 F (34.7 C)-99.1 F (37.3 C)] 94.5 F (34.7 C) (09/16 0800) Pulse Rate:  [73-121] 121 (09/16 1000) Resp:  [14-27] 21 (09/16 1000) BP: (91-149)/(56-86) 136/86 (09/16 0800) SpO2:  [91 %-100 %] 94 % (09/16 1000)  Hemodynamic parameters for last 24 hours:    Intake/Output from previous day: 09/15 0701 - 09/16 0700 In: 2464.5 [I.V.:85.7; NG/GT:2378.8] Out: 1850 [Urine:1400; Stool:450]  Intake/Output this shift: No intake/output data recorded.  Vent settings for last 24 hours:    Physical Exam:  Gen: comfortable, no distress Neuro: talkative, but unintelligible HEENT: PERRL Neck: supple CV: RRR Pulm: unlabored breathing Abd: soft, NT GU: clear yellow urine Extr: wwp, no edema   Results for orders placed or performed during the hospital encounter of 02/15/21 (from the past 24 hour(s))  Glucose, capillary     Status: Abnormal   Collection Time: 03/24/21 11:32 AM  Result Value Ref Range   Glucose-Capillary 101 (H) 70 - 99 mg/dL  Glucose, capillary     Status: Abnormal   Collection Time: 03/24/21  3:27 PM  Result Value Ref Range   Glucose-Capillary 101 (H) 70 - 99 mg/dL   Comment 1 Notify RN    Comment 2 Document in Chart   Glucose, capillary     Status: None   Collection Time: 03/24/21  7:18 PM  Result Value Ref Range   Glucose-Capillary 95 70 - 99 mg/dL  Glucose, capillary     Status: None   Collection Time: 03/24/21 11:10 PM  Result Value Ref Range   Glucose-Capillary 99 70 - 99 mg/dL  Glucose, capillary     Status: None   Collection Time: 03/25/21  3:13 AM  Result Value Ref Range   Glucose-Capillary 81 70 - 99 mg/dL  CBC with Differential/Platelet     Status: Abnormal   Collection Time: 03/25/21  7:13 AM  Result Value Ref Range   WBC 6.0 4.0 - 10.5 K/uL   RBC 3.58 (L) 4.22 - 5.81 MIL/uL   Hemoglobin 10.9 (L) 13.0 - 17.0  g/dL   HCT 40.9 (L) 81.1 - 91.4 %   MCV 96.6 80.0 - 100.0 fL   MCH 30.4 26.0 - 34.0 pg   MCHC 31.5 30.0 - 36.0 g/dL   RDW 78.2 95.6 - 21.3 %   Platelets 276 150 - 400 K/uL   nRBC 0.0 0.0 - 0.2 %   Neutrophils Relative % 58 %   Neutro Abs 3.4 1.7 - 7.7 K/uL   Lymphocytes Relative 29 %   Lymphs Abs 1.7 0.7 - 4.0 K/uL   Monocytes Relative 8 %   Monocytes Absolute 0.5 0.1 - 1.0 K/uL   Eosinophils Relative 4 %   Eosinophils Absolute 0.2 0.0 - 0.5 K/uL   Basophils Relative 1 %   Basophils Absolute 0.1 0.0 - 0.1 K/uL   Immature Granulocytes 0 %   Abs Immature Granulocytes 0.02 0.00 - 0.07 K/uL  Basic metabolic panel     Status: Abnormal   Collection Time: 03/25/21  7:13 AM  Result Value Ref Range   Sodium 137 135 - 145 mmol/L   Potassium 4.0 3.5 - 5.1 mmol/L   Chloride 100 98 - 111 mmol/L   CO2 29 22 - 32 mmol/L   Glucose, Bld 84 70 - 99 mg/dL   BUN 26 (H) 6 - 20 mg/dL   Creatinine,  Ser 0.43 (L) 0.61 - 1.24 mg/dL   Calcium 9.5 8.9 - 40.8 mg/dL   GFR, Estimated >14 >48 mL/min   Anion gap 8 5 - 15  Magnesium     Status: None   Collection Time: 03/25/21  7:13 AM  Result Value Ref Range   Magnesium 2.0 1.7 - 2.4 mg/dL  Glucose, capillary     Status: Abnormal   Collection Time: 03/25/21  7:47 AM  Result Value Ref Range   Glucose-Capillary 124 (H) 70 - 99 mg/dL    Assessment & Plan:  Present on Admission:  Traumatic brain injury (HCC)    LOS: 38 days   Additional comments:I reviewed the patient's new clinical lab test results.   and I reviewed the patients new imaging test results.    [facility]   SAH, 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 Conchita Paris, keppra x7d for sz ppx, repeat 4V Angio CT of neck 8/21 negative. 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  G1 L ICA injury - ASA 325mg  discontinued when starting eliquis for DVT (9/9) Facial laceration involving left eyelid - ENT c/s, Dr. 07-10-1989, repaired 8/9 Skull fx extending ito L TMJ,  L sphenoid sinus, R mastoid; R nasal bone fx - ENT c/s, Dr. 10/9, repeat exam when more neurologically appropriate R calf DVT - eliquis Acute hypoxic ventilator dependent respiratory failure - doing well on HTC > 24h ID/PNA - 9/6 resp CX Pseud/Acinetobacter - cefepime end 9/11. Ciprodex drops for otitis externa per Dr. 11/11. CV/Hypertensive urgency - metoprolol 150 q8h L clavicle and L scapula fx - non-op per Dr. Pollyann [address] rash, scattered abrasions - local wound care Alcohol abuse - CIWA H/o tobacco use disorder  FEN - g tube replaced 9/13. Clonidine protocol to wean Precedex DVT - SCDs, R calf DVT - eliquis Foley - out, retaining some again - resumed urecholine 9/14 Dispo - med surg, TBI team therapies, plan eventual CIR  10/14, MD Trauma & General Surgery Please use [email or web address] to contact on call provider  03/25/2021  *Care during the described time interval was provided by me. I have reviewed this patient's available data, including medical history, events of note, physical examination and test results as part of my evaluation.

## 2021-03-26 LAB — CBC WITH DIFFERENTIAL/PLATELET
Abs Immature Granulocytes: 0.01 10*3/uL (ref 0.00–0.07)
Basophils Absolute: 0 10*3/uL (ref 0.0–0.1)
Basophils Relative: 1 %
Eosinophils Absolute: 0.3 10*3/uL (ref 0.0–0.5)
Eosinophils Relative: 5 %
HCT: 35.2 % — ABNORMAL LOW (ref 39.0–52.0)
Hemoglobin: 11.3 g/dL — ABNORMAL LOW (ref 13.0–17.0)
Immature Granulocytes: 0 %
Lymphocytes Relative: 36 %
Lymphs Abs: 1.7 10*3/uL (ref 0.7–4.0)
MCH: 30.5 pg (ref 26.0–34.0)
MCHC: 32.1 g/dL (ref 30.0–36.0)
MCV: 94.9 fL (ref 80.0–100.0)
Monocytes Absolute: 0.4 10*3/uL (ref 0.1–1.0)
Monocytes Relative: 8 %
Neutro Abs: 2.3 10*3/uL (ref 1.7–7.7)
Neutrophils Relative %: 50 %
Platelets: 279 10*3/uL (ref 150–400)
RBC: 3.71 MIL/uL — ABNORMAL LOW (ref 4.22–5.81)
RDW: 15.3 % (ref 11.5–15.5)
WBC: 4.7 10*3/uL (ref 4.0–10.5)
nRBC: 0 % (ref 0.0–0.2)

## 2021-03-26 LAB — GLUCOSE, CAPILLARY
Glucose-Capillary: 125 mg/dL — ABNORMAL HIGH (ref 70–99)
Glucose-Capillary: 107 mg/dL — ABNORMAL HIGH (ref 70–99)
Glucose-Capillary: 114 mg/dL — ABNORMAL HIGH (ref 70–99)
Glucose-Capillary: 100 mg/dL — ABNORMAL HIGH (ref 70–99)

## 2021-03-26 MED ORDER — METHOCARBAMOL 500 MG PO TABS
1000.0000 mg | ORAL_TABLET | Freq: Three times a day (TID) | ORAL | Status: DC | PRN
  Administered 2021-03-28: 1000 mg
  Filled 2021-03-26: qty 2

## 2021-03-26 MED ORDER — HALOPERIDOL LACTATE 5 MG/ML IJ SOLN: 10.0000 mg | Freq: Two times a day (BID) | INTRAMUSCULAR | Status: DC

## 2021-03-26 MED ORDER — HALOPERIDOL LACTATE 5 MG/ML IJ SOLN
10.0000 mg | Freq: Two times a day (BID) | INTRAMUSCULAR | Status: DC
  Filled 2021-03-26 (×2): qty 2

## 2021-03-26 MED ORDER — HALOPERIDOL 5 MG PO TABS
10.0000 mg | ORAL_TABLET | Freq: Two times a day (BID) | ORAL | Status: DC
  Administered 2021-03-26 – 2021-03-28 (×5): 10 mg via ORAL
  Filled 2021-03-26 (×6): qty 2

## 2021-03-26 NOTE — Plan of Care (Signed)

## 2021-03-26 NOTE — Progress Notes (Signed)
Patient ID: Scott [name], male   DOB: January 29, 1986, 35 y.o.   MRN: [id number] Follow up - Trauma Critical Care  Patient Details:    Scott [name] is an 35 y.o. male.  Lines/tubes : PICC Triple Lumen 02/20/21 PICC Right Basilic 39 cm 0 cm (Active)  Indication for Insertion or Continuance of Line Prolonged intravenous therapies 03/25/21 0800  Exposed Catheter (cm) 1 cm 03/07/21 1200  Site Assessment Clean;Dry;Intact 03/25/21 0800  Lumen #1 Status In-line blood sampling system in place;Blood return noted 03/25/21 0800  Lumen #2 Status Infusing 03/25/21 0800  Lumen #3 Status Flushed;Saline locked 03/25/21 0800  Dressing Type Transparent;Securing device 03/25/21 0800  Dressing Status Clean;Dry;Intact 03/25/21 0800  Antimicrobial disc in place? Yes 03/25/21 0800  Safety Lock Not Applicable 03/24/21 2000  Line Care Connections checked and tightened 03/25/21 0800  Line Adjustment (NICU/IV Team Only) No 03/12/21 0800  Dressing Intervention New dressing;Dressing reinforced 03/20/21 0800  Dressing Change Due 03/26/21 03/25/21 0800     Gastrostomy/Enterostomy Gastrostomy 24 Fr. LLQ (Active)  Surrounding Skin Dry;Intact 03/25/21 2000  Tube Status Patent 03/25/21 2000  Dressing Status Clean;Dry;Intact 03/25/21 2000  Dressing Type Abdominal Binder 03/25/21 2000  G Port Intake (mL) 60 ml 03/22/21 2000     External Urinary Catheter (Active)  Collection Container Standard drainage bag 03/25/21 2000  Suction (Verified suction is between 40-80 mmHg) N/A (Patient has condom catheter) 03/25/21 2000  Securement Method Securing device (Describe) 03/25/21 2000  Site Assessment Clean;Intact;Dry 03/25/21 2000  Intervention Male External Urinary Catheter Replaced 03/18/21 0800  Output (mL) 600 mL 03/25/21 1900    Microbiology/Sepsis markers: Results for orders placed or performed during the hospital encounter of 02/15/21  Resp Panel by RT-PCR (Flu A&B, Covid) Nasopharyngeal Swab     Status: None    Collection Time: 02/15/21  8:14 PM   Specimen: Nasopharyngeal Swab; Nasopharyngeal(NP) swabs in vial transport medium  Result Value Ref Range Status   SARS Coronavirus 2 by RT PCR NEGATIVE NEGATIVE Final    Comment: (NOTE) SARS-CoV-2 target nucleic acids are NOT DETECTED.  The SARS-CoV-2 RNA is generally detectable in upper respiratory specimens during the acute phase of infection. The lowest concentration of SARS-CoV-2 viral copies this assay can detect is 138 copies/mL. A negative result does not preclude SARS-Cov-2 infection and should not be used as the sole basis for treatment or other patient management decisions. A negative result may occur with  improper specimen collection/handling, submission of specimen other than nasopharyngeal swab, presence of viral mutation(s) within the areas targeted by this assay, and inadequate number of viral copies(<138 copies/mL). A negative result must be combined with clinical observations, patient history, and epidemiological information. The expected result is Negative.  Fact Sheet for Patients:  BloggerCourse.com  Fact Sheet for Healthcare Providers:  SeriousBroker.it  This test is no t yet approved or cleared by the [place] FDA and  has been authorized for detection and/or diagnosis of SARS-CoV-2 by FDA under an Emergency Use Authorization (EUA). This EUA will remain  in effect (meaning this test can be used) for the duration of the COVID-19 declaration under Section 564(b)(1) of the Act, 21 U.S.C.section 360bbb-3(b)(1), unless the authorization is terminated  or revoked sooner.       Influenza A by PCR NEGATIVE NEGATIVE Final   Influenza B by PCR NEGATIVE NEGATIVE Final    Comment: (NOTE) The Xpert Xpress SARS-CoV-2/FLU/RSV plus assay is intended as an aid in the diagnosis of influenza from Nasopharyngeal swab specimens and should  not be used as a sole basis for treatment.  Nasal washings and aspirates are unacceptable for Xpert Xpress SARS-CoV-2/FLU/RSV testing.  Fact Sheet for Patients: BloggerCourse.com  Fact Sheet for Healthcare Providers: SeriousBroker.it  This test is not yet approved or cleared by the [place] FDA and has been authorized for detection and/or diagnosis of SARS-CoV-2 by FDA under an Emergency Use Authorization (EUA). This EUA will remain in effect (meaning this test can be used) for the duration of the COVID-19 declaration under Section 564(b)(1) of the Act, 21 U.S.C. section 360bbb-3(b)(1), unless the authorization is terminated or revoked.  Performed at [facility], [address], [place], [place] [address]   MRSA Next Gen by PCR, Nasal     Status: None   Collection Time: 02/15/21  9:58 PM   Specimen: Nasal Mucosa; Nasal Swab  Result Value Ref Range Status   MRSA by PCR Next Gen NOT DETECTED NOT DETECTED Final    Comment: (NOTE) The GeneXpert MRSA Assay (FDA approved for NASAL specimens only), is one component of a comprehensive MRSA colonization surveillance program. It is not intended to diagnose MRSA infection nor to guide or monitor treatment for MRSA infections. Test performance is not FDA approved in patients less than 67 years old. Performed at [facility], [address], [place], [place] [address]   Culture, Respiratory w Gram Stain     Status: None   Collection Time: 02/17/21  2:04 PM   Specimen: Tracheal Aspirate; Respiratory  Result Value Ref Range Status   Specimen Description TRACHEAL ASPIRATE  Final   Special Requests NONE  Final   Gram Stain   Final    ABUNDANT WBC PRESENT,BOTH PMN AND MONONUCLEAR ABUNDANT GRAM NEGATIVE RODS MODERATE GRAM POSITIVE COCCI RARE GRAM POSITIVE RODS    Culture   Final    FEW Normal respiratory flora-no Staph aureus or Pseudomonas seen Performed at [facility], [address], [place], [place]  [address]    Report Status 02/19/2021 FINAL  Final  Culture, blood (routine x 2)     Status: None   Collection Time: 02/17/21  2:24 PM   Specimen: BLOOD  Result Value Ref Range Status   Specimen Description BLOOD BLOOD RIGHT FOREARM  Final   Special Requests   Final    BOTTLES DRAWN AEROBIC AND ANAEROBIC Blood Culture adequate volume   Culture   Final    NO GROWTH 5 DAYS Performed at [facility], [address], [place], [place] [address]    Report Status 02/22/2021 FINAL  Final  Culture, blood (routine x 2)     Status: None   Collection Time: 02/17/21  3:01 PM   Specimen: BLOOD  Result Value Ref Range Status   Specimen Description BLOOD LEFT ANTECUBITAL  Final   Special Requests   Final    BOTTLES DRAWN AEROBIC AND ANAEROBIC Blood Culture adequate volume   Culture   Final    NO GROWTH 5 DAYS Performed at [facility], [address], [place], [place] [address]    Report Status 02/22/2021 FINAL  Final  Culture, Respiratory w Gram Stain     Status: None   Collection Time: 02/20/21 12:49 PM   Specimen: Tracheal Aspirate; Respiratory  Result Value Ref Range Status   Specimen Description TRACHEAL ASPIRATE  Final   Special Requests NONE  Final   Gram Stain   Final    MODERATE WBC PRESENT,BOTH PMN AND MONONUCLEAR ABUNDANT GRAM  POSITIVE COCCI IN PAIRS ABUNDANT GRAM NEGATIVE RODS Performed at [facility], [address], [place], [place] [address]    Culture   Final    ABUNDANT METHICILLIN RESISTANT STAPHYLOCOCCUS AUREUS ABUNDANT PSEUDOMONAS AERUGINOSA    Report Status 02/23/2021 FINAL  Final   Organism ID, Bacteria METHICILLIN RESISTANT STAPHYLOCOCCUS AUREUS  Final   Organism ID, Bacteria PSEUDOMONAS AERUGINOSA  Final      Susceptibility   Methicillin resistant staphylococcus aureus - MIC*    CIPROFLOXACIN <=0.5 SENSITIVE Sensitive     ERYTHROMYCIN <=0.25 SENSITIVE Sensitive     GENTAMICIN <=0.5 SENSITIVE Sensitive     OXACILLIN >=4 RESISTANT Resistant      TETRACYCLINE <=1 SENSITIVE Sensitive     VANCOMYCIN 1 SENSITIVE Sensitive     TRIMETH/SULFA <=10 SENSITIVE Sensitive     CLINDAMYCIN <=0.25 SENSITIVE Sensitive     RIFAMPIN <=0.5 SENSITIVE Sensitive     Inducible Clindamycin NEGATIVE Sensitive     * ABUNDANT METHICILLIN RESISTANT STAPHYLOCOCCUS AUREUS   Pseudomonas aeruginosa - MIC*    CEFTAZIDIME 4 SENSITIVE Sensitive     CIPROFLOXACIN 0.5 SENSITIVE Sensitive     GENTAMICIN <=1 SENSITIVE Sensitive     IMIPENEM 2 SENSITIVE Sensitive     PIP/TAZO 8 SENSITIVE Sensitive     CEFEPIME 2 SENSITIVE Sensitive     * ABUNDANT PSEUDOMONAS AERUGINOSA  Monkeypox Virus DNA, Qualitative Real-Time PCR     Status: None   Collection Time: 02/23/21  1:42 AM   Specimen: Arm, Right; Sterile Swab  Result Value Ref Range Status   Orthopoxvirus DNA, QL PCR NOT DETECTED NOT DETECTED Final    Comment: (NOTE) Non-variola Orthopoxvirus DNA not detected by real-time PCR. Performed At: [facility] [address] [place], [place] [id number] Jolene Schimke MD NG:[id number]   Culture, Respiratory w Gram Stain     Status: None   Collection Time: 02/23/21 10:05 AM   Specimen: Tracheal Aspirate; Respiratory  Result Value Ref Range Status   Specimen Description TRACHEAL ASPIRATE  Final   Special Requests NONE  Final   Gram Stain   Final    ABUNDANT WBC PRESENT, PREDOMINANTLY PMN FEW GRAM NEGATIVE RODS RARE GRAM POSITIVE COCCI Performed at [facility], [address], [place], [place] [address]    Culture   Final    MODERATE PSEUDOMONAS AERUGINOSA RARE METHICILLIN RESISTANT STAPHYLOCOCCUS AUREUS    Report Status 02/26/2021 FINAL  Final   Organism ID, Bacteria PSEUDOMONAS AERUGINOSA  Final   Organism ID, Bacteria METHICILLIN RESISTANT STAPHYLOCOCCUS AUREUS  Final      Susceptibility   Methicillin resistant staphylococcus aureus - MIC*    CIPROFLOXACIN <=0.5 SENSITIVE Sensitive     ERYTHROMYCIN <=0.25 SENSITIVE Sensitive     GENTAMICIN <=0.5  SENSITIVE Sensitive     OXACILLIN >=4 RESISTANT Resistant     TETRACYCLINE <=1 SENSITIVE Sensitive     VANCOMYCIN <=0.5 SENSITIVE Sensitive     TRIMETH/SULFA <=10 SENSITIVE Sensitive     CLINDAMYCIN <=0.25 SENSITIVE Sensitive     RIFAMPIN <=0.5 SENSITIVE Sensitive     Inducible Clindamycin NEGATIVE Sensitive     * RARE METHICILLIN RESISTANT STAPHYLOCOCCUS AUREUS   Pseudomonas aeruginosa - MIC*    CEFTAZIDIME 4 SENSITIVE Sensitive     CIPROFLOXACIN <=0.25 SENSITIVE Sensitive     GENTAMICIN <=1 SENSITIVE Sensitive     IMIPENEM 1 SENSITIVE Sensitive     PIP/TAZO 8 SENSITIVE Sensitive     CEFEPIME 2 SENSITIVE Sensitive     * MODERATE PSEUDOMONAS  AERUGINOSA  Culture, blood (Routine X 2) w Reflex to ID Panel     Status: None   Collection Time: 02/24/21 10:53 AM   Specimen: BLOOD RIGHT HAND  Result Value Ref Range Status   Specimen Description BLOOD RIGHT HAND  Final   Special Requests   Final    BOTTLES DRAWN AEROBIC AND ANAEROBIC Blood Culture adequate volume   Culture   Final    NO GROWTH 5 DAYS Performed at [facility], [address] [place], [place] [address]    Report Status 03/01/2021 FINAL  Final  Culture, blood (Routine X 2) w Reflex to ID Panel     Status: None   Collection Time: 02/24/21 10:58 AM   Specimen: BLOOD LEFT HAND  Result Value Ref Range Status   Specimen Description BLOOD LEFT HAND  Final   Special Requests   Final    BOTTLES DRAWN AEROBIC AND ANAEROBIC Blood Culture adequate volume   Culture   Final    NO GROWTH 5 DAYS Performed at [facility], [address], [place], [place] [address]    Report Status 03/01/2021 FINAL  Final  SARS CORONAVIRUS 2 (TAT 6-24 HRS) Nasopharyngeal Nasopharyngeal Swab     Status: None   Collection Time: 02/25/21  9:20 AM   Specimen: Nasopharyngeal Swab  Result Value Ref Range Status   SARS Coronavirus 2 NEGATIVE NEGATIVE Final    Comment: (NOTE) SARS-CoV-2 target nucleic acids are NOT DETECTED.  The  SARS-CoV-2 RNA is generally detectable in upper and lower respiratory specimens during the acute phase of infection. Negative results do not preclude SARS-CoV-2 infection, do not rule out co-infections with other pathogens, and should not be used as the sole basis for treatment or other patient management decisions. Negative results must be combined with clinical observations, patient history, and epidemiological information. The expected result is Negative.  Fact Sheet for Patients: HairSlick.no  Fact Sheet for Healthcare Providers: quierodirigir.com  This test is not yet approved or cleared by the [place] FDA and  has been authorized for detection and/or diagnosis of SARS-CoV-2 by FDA under an Emergency Use Authorization (EUA). This EUA will remain  in effect (meaning this test can be used) for the duration of the COVID-19 declaration under Se ction 564(b)(1) of the Act, 21 U.S.C. section 360bbb-3(b)(1), unless the authorization is terminated or revoked sooner.  Performed at [facility], [address] [place], [place] [address]   Culture, Respiratory w Gram Stain     Status: None   Collection Time: 03/04/21 10:37 AM   Specimen: Tracheal Aspirate; Respiratory  Result Value Ref Range Status   Specimen Description TRACHEAL ASPIRATE  Final   Special Requests NONE  Final   Gram Stain   Final    FEW SQUAMOUS EPITHELIAL CELLS PRESENT MODERATE WBC PRESENT, PREDOMINANTLY PMN MODERATE GRAM NEGATIVE RODS    Culture   Final    FEW PSEUDOMONAS AERUGINOSA NO STAPHYLOCOCCUS AUREUS ISOLATED Performed at [facility], [address], [place], [place] [address]    Report Status 03/07/2021 FINAL  Final   Organism ID, Bacteria PSEUDOMONAS AERUGINOSA  Final      Susceptibility   Pseudomonas aeruginosa - MIC*    CEFTAZIDIME 16 INTERMEDIATE Intermediate     CIPROFLOXACIN <=0.25 SENSITIVE Sensitive     GENTAMICIN <=1  SENSITIVE Sensitive     IMIPENEM 2 SENSITIVE Sensitive     * FEW PSEUDOMONAS AERUGINOSA  Culture, Respiratory w Gram Stain  Status: None   Collection Time: 03/06/21  1:56 PM   Specimen: Tracheal Aspirate; Respiratory  Result Value Ref Range Status   Specimen Description TRACHEAL ASPIRATE  Final   Special Requests NONE  Final   Gram Stain   Final    MODERATE SQUAMOUS EPITHELIAL CELLS PRESENT MODERATE WBC PRESENT, PREDOMINANTLY PMN FEW GRAM NEGATIVE RODS FEW GRAM POSITIVE COCCI Performed at [facility], [address], [place], [place] [address]    Culture   Final    ABUNDANT PSEUDOMONAS AERUGINOSA ABUNDANT METHICILLIN RESISTANT STAPHYLOCOCCUS AUREUS    Report Status 03/09/2021 FINAL  Final   Organism ID, Bacteria PSEUDOMONAS AERUGINOSA  Final   Organism ID, Bacteria METHICILLIN RESISTANT STAPHYLOCOCCUS AUREUS  Final      Susceptibility   Methicillin resistant staphylococcus aureus - MIC*    CIPROFLOXACIN <=0.5 SENSITIVE Sensitive     ERYTHROMYCIN <=0.25 SENSITIVE Sensitive     GENTAMICIN <=0.5 SENSITIVE Sensitive     OXACILLIN >=4 RESISTANT Resistant     TETRACYCLINE <=1 SENSITIVE Sensitive     VANCOMYCIN <=0.5 SENSITIVE Sensitive     TRIMETH/SULFA <=10 SENSITIVE Sensitive     CLINDAMYCIN <=0.25 SENSITIVE Sensitive     RIFAMPIN <=0.5 SENSITIVE Sensitive     Inducible Clindamycin NEGATIVE Sensitive     * ABUNDANT METHICILLIN RESISTANT STAPHYLOCOCCUS AUREUS   Pseudomonas aeruginosa - MIC*    CEFTAZIDIME 2 SENSITIVE Sensitive     CIPROFLOXACIN <=0.25 SENSITIVE Sensitive     GENTAMICIN <=1 SENSITIVE Sensitive     IMIPENEM 0.5 SENSITIVE Sensitive     PIP/TAZO <=4 SENSITIVE Sensitive     CEFEPIME 2 SENSITIVE Sensitive     * ABUNDANT PSEUDOMONAS AERUGINOSA  Aerobic Culture w Gram Stain (superficial specimen)     Status: None   Collection Time: 03/06/21  3:13 PM   Specimen: Wound  Result Value Ref Range Status   Specimen Description WOUND  Final   Special Requests  EAR RIGHT  Final   Gram Stain   Final    ABUNDANT WBC PRESENT,BOTH PMN AND MONONUCLEAR RARE SQUAMOUS EPITHELIAL CELLS PRESENT ABUNDANT GRAM NEGATIVE RODS FEW GRAM VARIABLE ROD FEW GRAM POSITIVE COCCI FEW YEAST Performed at [facility], [address] [place], [place] [address]    Culture   Final    MODERATE ENTEROBACTER CLOACAE FEW KLEBSIELLA PNEUMONIAE    Report Status 03/09/2021 FINAL  Final   Organism ID, Bacteria ENTEROBACTER CLOACAE  Final   Organism ID, Bacteria KLEBSIELLA PNEUMONIAE  Final      Susceptibility   Enterobacter cloacae - MIC*    CEFAZOLIN >=64 RESISTANT Resistant     CEFEPIME <=0.12 SENSITIVE Sensitive     CEFTAZIDIME <=1 SENSITIVE Sensitive     CIPROFLOXACIN <=0.25 SENSITIVE Sensitive     GENTAMICIN <=1 SENSITIVE Sensitive     IMIPENEM <=0.25 SENSITIVE Sensitive     TRIMETH/SULFA <=20 SENSITIVE Sensitive     PIP/TAZO <=4 SENSITIVE Sensitive     * MODERATE ENTEROBACTER CLOACAE   Klebsiella pneumoniae - MIC*    AMPICILLIN >=32 RESISTANT Resistant     CEFAZOLIN <=4 SENSITIVE Sensitive     CEFEPIME <=0.12 SENSITIVE Sensitive     CEFTAZIDIME <=1 SENSITIVE Sensitive     CEFTRIAXONE <=0.25 SENSITIVE Sensitive     CIPROFLOXACIN <=0.25 SENSITIVE Sensitive     GENTAMICIN <=1 SENSITIVE Sensitive     IMIPENEM <=0.25 SENSITIVE Sensitive     TRIMETH/SULFA <=20 SENSITIVE Sensitive     AMPICILLIN/SULBACTAM 4 SENSITIVE Sensitive  PIP/TAZO <=4 SENSITIVE Sensitive     * FEW KLEBSIELLA PNEUMONIAE  Culture, Respiratory w Gram Stain     Status: None   Collection Time: 03/09/21 10:46 AM   Specimen: Tracheal Aspirate; Respiratory  Result Value Ref Range Status   Specimen Description TRACHEAL ASPIRATE  Final   Special Requests NONE  Final   Gram Stain   Final    FEW WBC PRESENT, PREDOMINANTLY PMN FEW GRAM NEGATIVE RODS Performed at [facility], [address], [place], [place] [address]    Culture ABUNDANT PSEUDOMONAS AERUGINOSA  Final   Report  Status 03/15/2021 FINAL  Final   Organism ID, Bacteria PSEUDOMONAS AERUGINOSA  Final      Susceptibility   Pseudomonas aeruginosa - MIC*    CEFTAZIDIME 4 SENSITIVE Sensitive     CIPROFLOXACIN <=0.25 SENSITIVE Sensitive     GENTAMICIN <=1 SENSITIVE Sensitive     IMIPENEM 2 SENSITIVE Sensitive     PIP/TAZO 8 SENSITIVE Sensitive     CEFEPIME 2 SENSITIVE Sensitive     * ABUNDANT PSEUDOMONAS AERUGINOSA  Surgical pcr screen     Status: Abnormal   Collection Time: 03/11/21  7:26 AM   Specimen: Nasal Mucosa; Nasal Swab  Result Value Ref Range Status   MRSA, PCR POSITIVE (A) NEGATIVE Final    Comment: RESULT CALLED TO, READ BACK BY AND VERIFIED WITH: RN P.TRAVIS AT 0915 ON 03/11/2021 BY T.SAAD.    Staphylococcus aureus POSITIVE (A) NEGATIVE Final    Comment: (NOTE) The Xpert SA Assay (FDA approved for NASAL specimens in patients 39 years of age and older), is one component of a comprehensive surveillance program. It is not intended to diagnose infection nor to guide or monitor treatment. Performed at [facility], [address] [place], [place] [address]   Culture, blood (Routine X 2) w Reflex to ID Panel     Status: None   Collection Time: 03/13/21 11:15 AM   Specimen: BLOOD  Result Value Ref Range Status   Specimen Description BLOOD LEFT ANTECUBITAL  Final   Special Requests AEROBIC BOTTLE ONLY Blood Culture adequate volume  Final   Culture   Final    NO GROWTH 5 DAYS Performed at [facility], [address], [place], [place] [address]    Report Status 03/18/2021 FINAL  Final  Culture, blood (Routine X 2) w Reflex to ID Panel     Status: None   Collection Time: 03/13/21 11:15 AM   Specimen: BLOOD  Result Value Ref Range Status   Specimen Description BLOOD BLOOD LEFT FOREARM  Final   Special Requests AEROBIC BOTTLE ONLY Blood Culture adequate volume  Final   Culture   Final    NO GROWTH 5 DAYS Performed at [facility], [address] [place], [place]  [address]    Report Status 03/18/2021 FINAL  Final  Culture, Respiratory w Gram Stain     Status: None   Collection Time: 03/15/21 10:18 AM   Specimen: Tracheal Aspirate; Respiratory  Result Value Ref Range Status   Specimen Description TRACHEAL ASPIRATE  Final   Special Requests NONE  Final   Gram Stain   Final    ABUNDANT WBC PRESENT, PREDOMINANTLY PMN NO ORGANISMS SEEN Performed at [facility], [address] [place], [place] [address]    Culture   Final    MODERATE PSEUDOMONAS AERUGINOSA MODERATE ACINETOBACTER CALCOACETICUS/BAUMANNII COMPLEX    Report Status 03/18/2021 FINAL  Final   Organism  ID, Bacteria PSEUDOMONAS AERUGINOSA  Final   Organism ID, Bacteria ACINETOBACTER CALCOACETICUS/BAUMANNII COMPLEX  Final      Susceptibility   Acinetobacter calcoaceticus/baumannii complex - MIC*    CEFTAZIDIME 4 SENSITIVE Sensitive     CIPROFLOXACIN <=0.25 SENSITIVE Sensitive     GENTAMICIN <=1 SENSITIVE Sensitive     IMIPENEM <=0.25 SENSITIVE Sensitive     PIP/TAZO <=4 SENSITIVE Sensitive     TRIMETH/SULFA <=20 SENSITIVE Sensitive     AMPICILLIN/SULBACTAM <=2 SENSITIVE Sensitive     * MODERATE ACINETOBACTER CALCOACETICUS/BAUMANNII COMPLEX   Pseudomonas aeruginosa - MIC*    CEFTAZIDIME 4 SENSITIVE Sensitive     CIPROFLOXACIN <=0.25 SENSITIVE Sensitive     GENTAMICIN <=1 SENSITIVE Sensitive     IMIPENEM 2 SENSITIVE Sensitive     PIP/TAZO <=4 SENSITIVE Sensitive     CEFEPIME 4 SENSITIVE Sensitive     * MODERATE PSEUDOMONAS AERUGINOSA    Anti-infectives:  Anti-infectives (From admission, onward)    Start     Dose/Rate Route Frequency Ordered Stop   03/15/21 0400  vancomycin (VANCOCIN) IVPB 1000 mg/200 mL premix  Status:  Discontinued        1,000 mg 200 mL/hr over 60 Minutes Intravenous Every 8 hours 03/15/21 0125 03/17/21 0928   03/13/21 1200  ceFEPIme (MAXIPIME) 2 g in sodium chloride 0.9 % 100 mL IVPB        2 g 200 mL/hr over 30 Minutes Intravenous Every 8 hours  03/13/21 1103 03/20/21 0429   03/12/21 2200  vancomycin (VANCOREADY) IVPB 750 mg/150 mL  Status:  Discontinued        750 mg 150 mL/hr over 60 Minutes Intravenous Every 8 hours 03/12/21 1537 03/15/21 0125   03/12/21 0530  vancomycin (VANCOREADY) IVPB 750 mg/150 mL  Status:  Discontinued        750 mg 150 mL/hr over 60 Minutes Intravenous Every 8 hours 03/11/21 2145 03/12/21 1340   03/10/21 1400  piperacillin-tazobactam (ZOSYN) IVPB 3.375 g  Status:  Discontinued        3.375 g 12.5 mL/hr over 240 Minutes Intravenous Every 8 hours 03/10/21 0909 03/12/21 1340   03/10/21 1000  vancomycin (VANCOCIN) IVPB 1000 mg/200 mL premix  Status:  Discontinued        1,000 mg 200 mL/hr over 60 Minutes Intravenous Every 8 hours 03/10/21 0908 03/11/21 2145   03/06/21 1415  piperacillin-tazobactam (ZOSYN) IVPB 3.375 g        3.375 g 12.5 mL/hr over 240 Minutes Intravenous Every 8 hours 03/06/21 1324 03/10/21 0902   02/24/21 1200  vancomycin (VANCOCIN) IVPB 1000 mg/200 mL premix        1,000 mg 200 mL/hr over 60 Minutes Intravenous Every 8 hours 02/24/21 0934 03/02/21 2020   02/23/21 0200  vancomycin (VANCOREADY) IVPB 1500 mg/300 mL  Status:  Discontinued        1,500 mg 150 mL/hr over 120 Minutes Intravenous Every 8 hours 02/22/21 1931 02/24/21 0934   02/22/21 2100  vancomycin (VANCOCIN) 250 mg in sodium chloride 0.9 % 100 mL IVPB        250 mg 100 mL/hr over 60 Minutes Intravenous  Once 02/22/21 2012 02/22/21 2150   02/22/21 2030  vancomycin (VANCOCIN) 250 mg in sodium chloride 0.9 % 500 mL IVPB  Status:  Discontinued        250 mg 250 mL/hr over 120 Minutes Intravenous  Once 02/22/21 1931 02/22/21 2012   02/21/21 1800  vancomycin (VANCOREADY) IVPB 1250 mg/250 mL  Status:  Discontinued  1,250 mg 166.7 mL/hr over 90 Minutes Intravenous Every 8 hours 02/21/21 0851 02/22/21 1931   02/21/21 1000  ceFEPIme (MAXIPIME) 2 g in sodium chloride 0.9 % 100 mL IVPB        2 g 200 mL/hr over 30 Minutes  Intravenous Every 8 hours 02/21/21 0851 03/02/21 1749   02/21/21 1000  vancomycin (VANCOREADY) IVPB 2000 mg/400 mL        2,000 mg 200 mL/hr over 120 Minutes Intravenous  Once 02/21/21 0851 02/21/21 1306      Consults: Treatment Team:  Myrene Galas, MD    Studies:    Events:  Subjective:    Overnight Issues:   Objective:  Vital signs for last 24 hours: Temp:  [94.5 F (34.7 C)-98.5 F (36.9 C)] 97.8 F (36.6 C) (09/17 0400) Pulse Rate:  [74-204] 92 (09/17 0600) Resp:  [10-25] 12 (09/17 0600) BP: (99-156)/(65-106) 111/73 (09/17 0613) SpO2:  [89 %-100 %] 93 % (09/17 0600) Weight:  [62.9 kg-64.8 kg] 64.8 kg (09/17 0613)  Hemodynamic parameters for last 24 hours:    Intake/Output from previous day: 09/16 0701 - 09/17 0700 In: 1503 [I.V.:30; NG/GT:1473] Out: 1600 [Urine:1600]  Intake/Output this shift: No intake/output data recorded.  Vent settings for last 24 hours:    Physical Exam:  Alert and F/C Lungs CTA Abd soft, G tube CV RRR Ext no edema Results for orders placed or performed during the hospital encounter of 02/15/21 (from the past 24 hour(s))  Glucose, capillary     Status: Abnormal   Collection Time: 03/25/21  7:47 AM  Result Value Ref Range   Glucose-Capillary 124 (H) 70 - 99 mg/dL  Glucose, capillary     Status: Abnormal   Collection Time: 03/25/21 11:27 AM  Result Value Ref Range   Glucose-Capillary 110 (H) 70 - 99 mg/dL  Glucose, capillary     Status: Abnormal   Collection Time: 03/25/21  3:17 PM  Result Value Ref Range   Glucose-Capillary 120 (H) 70 - 99 mg/dL  Glucose, capillary     Status: Abnormal   Collection Time: 03/25/21  7:43 PM  Result Value Ref Range   Glucose-Capillary 111 (H) 70 - 99 mg/dL  Glucose, capillary     Status: Abnormal   Collection Time: 03/25/21 11:13 PM  Result Value Ref Range   Glucose-Capillary 125 (H) 70 - 99 mg/dL  Glucose, capillary     Status: Abnormal   Collection Time: 03/26/21  3:12 AM  Result  Value Ref Range   Glucose-Capillary 125 (H) 70 - 99 mg/dL  CBC with Differential/Platelet     Status: Abnormal   Collection Time: 03/26/21  5:35 AM  Result Value Ref Range   WBC 4.7 4.0 - 10.5 K/uL   RBC 3.71 (L) 4.22 - 5.81 MIL/uL   Hemoglobin 11.3 (L) 13.0 - 17.0 g/dL   HCT 85.4 (L) 62.7 - 03.5 %   MCV 94.9 80.0 - 100.0 fL   MCH 30.5 26.0 - 34.0 pg   MCHC 32.1 30.0 - 36.0 g/dL   RDW 00.9 38.1 - 82.9 %   Platelets 279 150 - 400 K/uL   nRBC 0.0 0.0 - 0.2 %   Neutrophils Relative % 50 %   Neutro Abs 2.3 1.7 - 7.7 K/uL   Lymphocytes Relative 36 %   Lymphs Abs 1.7 0.7 - 4.0 K/uL   Monocytes Relative 8 %   Monocytes Absolute 0.4 0.1 - 1.0 K/uL   Eosinophils Relative 5 %   Eosinophils Absolute  0.3 0.0 - 0.5 K/uL   Basophils Relative 1 %   Basophils Absolute 0.0 0.0 - 0.1 K/uL   Immature Granulocytes 0 %   Abs Immature Granulocytes 0.01 0.00 - 0.07 K/uL  Glucose, capillary     Status: Abnormal   Collection Time: 03/26/21  7:35 AM  Result Value Ref Range   Glucose-Capillary 107 (H) 70 - 99 mg/dL    Assessment & Plan: Present on Admission:  Traumatic brain injury (HCC)    LOS: 39 days   Additional comments:I reviewed the patient's new clinical lab test results. Marland Kitchen [facility]   SAH, 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 Conchita Paris, keppra x7d for sz ppx, repeat 4V Angio CT of neck 8/21 negative. 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  G1 L ICA injury - ASA  discontinued when starting eliquis for DVT (9/9) Facial laceration involving left eyelid - ENT c/s, Dr. Pollyann Kennedy, repaired 8/9 Skull fx extending ito L TMJ, L sphenoid sinus, R mastoid; R nasal bone fx - ENT c/s, Dr. Pollyann Kennedy, repeat exam when more neurologically appropriate R calf DVT - eliquis Acute hypoxic respiratory failure - doing well. Janina Mayo out ID/PNA - 9/6 resp CX Pseud/Acinetobacter - cefepime end 9/11. Ciprodex drops for otitis externa per Dr. Pollyann Kennedy. CV/Hypertensive urgency -  metoprolol 150 q8h L clavicle and L scapula fx - non-op per Dr. [name] rash, scattered abrasions - local wound care Alcohol abuse - CIWA H/o tobacco use disorder  FEN - g tube replaced 9/13. Clonidine protocol and off Precedex DVT - SCDs, R calf DVT - eliquis Foley - out, retaining some again - resumed urecholine 9/14 Dispo - med surg, TBI team therapies, plan eventual CIR   Violeta Gelinas, MD, MPH, FACS Trauma & General Surgery Use [email or web address] to contact on call provider  03/26/2021  *Care during the described time interval was provided by me. I have reviewed this patient's available data, including medical history, events of note, physical examination and test results as part of my evaluation.

## 2021-03-26 NOTE — Progress Notes (Signed)
Patient s/o at bedside and requesting no narcotics to be given to patient. Notified Trauma to update and stated they will look at orders.

## 2021-03-26 NOTE — Progress Notes (Signed)
Stoma care done. Pt tolerated well. Rt will cont to monitor.

## 2021-03-26 NOTE — Progress Notes (Signed)
Patient on quite a lot of scheduled (just increased on Friday) and PRN narcotics although has not really received any PRNs for the last few days, in addition to which he is on fairly high doses of scheduled Klonopin and Haldol and Seroquel, on a clonidine taper, scheduled Robaxin, as needed Versed which he has gotten multiple times each day through yesterday but none today.   Family noting that he is quite sedated and requesting narcotics be stopped.  Patient is denying pain.  I would not recommend stopping them altogether, but I think reasonable to begin tapering the multiple medications that he has been on for his mental status now that he seems to be improving from that standpoint.   -Given that he has not received any PRN oxycodone since 9/13, will discontinue the scheduled oxycodone and leave the as needed dose -Change Robaxin to PRN -Decrease scheduled Haldol to every 12 hours from every 6 hours  Will continue weaning antipsychotics and benzos over the next few days as able.

## 2021-03-27 LAB — CBC WITH DIFFERENTIAL/PLATELET
Abs Immature Granulocytes: 0.02 10*3/uL (ref 0.00–0.07)
Basophils Absolute: 0.1 10*3/uL (ref 0.0–0.1)
Basophils Relative: 1 %
Eosinophils Absolute: 0.3 10*3/uL (ref 0.0–0.5)
Eosinophils Relative: 5 %
HCT: 39.4 % (ref 39.0–52.0)
Hemoglobin: 13 g/dL (ref 13.0–17.0)
Immature Granulocytes: 0 %
Lymphocytes Relative: 31 %
Lymphs Abs: 1.8 10*3/uL (ref 0.7–4.0)
MCH: 30.7 pg (ref 26.0–34.0)
MCHC: 33 g/dL (ref 30.0–36.0)
MCV: 93.1 fL (ref 80.0–100.0)
Monocytes Absolute: 0.4 10*3/uL (ref 0.1–1.0)
Monocytes Relative: 8 %
Neutro Abs: 3.2 10*3/uL (ref 1.7–7.7)
Neutrophils Relative %: 55 %
Platelets: 303 10*3/uL (ref 150–400)
RBC: 4.23 MIL/uL (ref 4.22–5.81)
RDW: 15 % (ref 11.5–15.5)
WBC: 5.7 10*3/uL (ref 4.0–10.5)
nRBC: 0 % (ref 0.0–0.2)

## 2021-03-27 LAB — GLUCOSE, CAPILLARY
Glucose-Capillary: 100 mg/dL — ABNORMAL HIGH (ref 70–99)
Glucose-Capillary: 106 mg/dL — ABNORMAL HIGH (ref 70–99)
Glucose-Capillary: 125 mg/dL — ABNORMAL HIGH (ref 70–99)
Glucose-Capillary: 145 mg/dL — ABNORMAL HIGH (ref 70–99)
Glucose-Capillary: 98 mg/dL (ref 70–99)

## 2021-03-27 MED ORDER — QUETIAPINE FUMARATE 100 MG PO TABS
200.0000 mg | ORAL_TABLET | Freq: Two times a day (BID) | ORAL | Status: DC
  Administered 2021-03-27: 200 mg
  Filled 2021-03-27: qty 2

## 2021-03-27 MED ORDER — CLONAZEPAM 0.5 MG PO TABS
2.0000 mg | ORAL_TABLET | Freq: Two times a day (BID) | ORAL | Status: DC
  Administered 2021-03-27: 2 mg
  Filled 2021-03-27: qty 4
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
## 2021-03-27 NOTE — Plan of Care (Signed)

## 2021-03-28 ENCOUNTER — Inpatient Hospital Stay (HOSPITAL_COMMUNITY): Payer: 59

## 2021-03-28 LAB — BASIC METABOLIC PANEL
Anion gap: 9 (ref 5–15)
BUN: 22 mg/dL — ABNORMAL HIGH (ref 6–20)
CO2: 26 mmol/L (ref 22–32)
Calcium: 10.5 mg/dL — ABNORMAL HIGH (ref 8.9–10.3)
Chloride: 101 mmol/L (ref 98–111)
Creatinine, Ser: 0.55 mg/dL — ABNORMAL LOW (ref 0.61–1.24)
GFR, Estimated: >60 mL/min (ref >60)
Glucose, Bld: 140 mg/dL — ABNORMAL HIGH (ref 70–99)
Potassium: 3.9 mmol/L (ref 3.5–5.1)
Sodium: 136 mmol/L (ref 135–145)

## 2021-03-28 LAB — CBC WITH DIFFERENTIAL/PLATELET
Abs Immature Granulocytes: 0.03 10*3/uL (ref 0.00–0.07)
Basophils Absolute: 0.1 10*3/uL (ref 0.0–0.1)
Basophils Relative: 1 %
Eosinophils Absolute: 0 10*3/uL (ref 0.0–0.5)
Eosinophils Relative: 0 %
HCT: 42.5 % (ref 39.0–52.0)
Hemoglobin: 14.3 g/dL (ref 13.0–17.0)
Immature Granulocytes: 0 %
Lymphocytes Relative: 17 %
Lymphs Abs: 1.9 10*3/uL (ref 0.7–4.0)
MCH: 30.8 pg (ref 26.0–34.0)
MCHC: 33.6 g/dL (ref 30.0–36.0)
MCV: 91.6 fL (ref 80.0–100.0)
Monocytes Absolute: 0.7 10*3/uL (ref 0.1–1.0)
Monocytes Relative: 6 %
Neutro Abs: 8.4 10*3/uL — ABNORMAL HIGH (ref 1.7–7.7)
Neutrophils Relative %: 76 %
Platelets: 350 10*3/uL (ref 150–400)
RBC: 4.64 MIL/uL (ref 4.22–5.81)
RDW: 14.9 % (ref 11.5–15.5)
WBC: 11 10*3/uL — ABNORMAL HIGH (ref 4.0–10.5)
nRBC: 0 % (ref 0.0–0.2)

## 2021-03-28 LAB — BLOOD GAS, ARTERIAL
Acid-Base Excess: 0.7 mmol/L (ref 0.0–2.0)
Bicarbonate: 24.5 mmol/L (ref 20.0–28.0)
FIO2: 21
O2 Saturation: 95.3 %
Patient temperature: 37
pCO2 arterial: 36.8 mmHg (ref 32.0–48.0)
pH, Arterial: 7.438 (ref 7.350–7.450)
pO2, Arterial: 75.2 mmHg — ABNORMAL LOW (ref 83.0–108.0)

## 2021-03-28 LAB — GLUCOSE, CAPILLARY
Glucose-Capillary: 129 mg/dL — ABNORMAL HIGH (ref 70–99)
Glucose-Capillary: 111 mg/dL — ABNORMAL HIGH (ref 70–99)
Glucose-Capillary: 125 mg/dL — ABNORMAL HIGH (ref 70–99)
Glucose-Capillary: 126 mg/dL — ABNORMAL HIGH (ref 70–99)

## 2021-03-28 MED ORDER — CLONAZEPAM 0.5 MG PO TABS
1.0000 mg | ORAL_TABLET | Freq: Two times a day (BID) | ORAL | Status: DC
  Administered 2021-03-28 – 2021-03-30 (×5): 1 mg
  Filled 2021-03-28 (×5): qty 2

## 2021-03-28 MED ORDER — QUETIAPINE FUMARATE 100 MG PO TABS
100.0000 mg | ORAL_TABLET | Freq: Two times a day (BID) | ORAL | Status: DC
  Administered 2021-03-28 – 2021-03-30 (×5): 100 mg
  Filled 2021-03-28 (×5): qty 1

## 2021-03-28 NOTE — Progress Notes (Signed)
ABG sent to LAB.

## 2021-03-28 NOTE — Progress Notes (Signed)
Patient continues to require restraint for safety as he continues to try to get OOB and pulling on tubes.
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
## 2021-03-28 NOTE — Progress Notes (Addendum)
Tube feed restarted at this time. Patient sounds coarse, and is having some cough. Will notify MD, might need an xray. Significant order at bedside.  6:27pm Tube feed turned off as patient is having dyspnea and coughing. Sats ranged between 89-90%, tachycardia 100. O2 2L applied, will continue to monitor and notify MD.

## 2021-03-28 NOTE — Progress Notes (Signed)
Orthopedic Tech Progress Note Patient Details:  Scott [name] 1985/10/09 [id number]  RN called requesting a new ABDOMINAL BINDER. dropped off to room   Ortho Devices Type of Ortho Device: Abdominal binder Ortho Device/Splint Location: STOMACH Ortho Device/Splint Interventions: Other (comment), Ordered   Post Interventions Patient Tolerated: Other (comment) Instructions Provided: Other (comment)  Donald Pore 03/28/2021, 1:56 PM

## 2021-03-28 NOTE — Progress Notes (Signed)
Patient vomited twice, PA made aware and speech therapy as well. Will hold off diet per speech and inform PA that I will hold off tube feeding and will restart this afternoon if vomiting is resolved.
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
## 2021-03-28 NOTE — ED Notes (Signed)
Lou Loewe TRN, came to check on pt per Dr. Marvetta Gibbons request.  Spoke with primary nurse as well and both in agreeance that pt seems to not be in distress and is resting.   TRN called XR for port CXR.  Order fixed to be done STAT.  TRN also called RT to draw ABG.   See results for both^  No new orders at this time.  Adrian, [place] [phone]

## 2021-03-28 NOTE — Progress Notes (Signed)
Inpatient Rehabilitation Admissions Coordinator   I met at bedside with pt's significant other, April. I explained that I am beginning Auth with [facility] [place] today once all TBI team notes in for a CIR admit this week once approved. She is in agreement.  Danne Baxter, RN, MSN Rehab Admissions Coordinator [phone] 03/28/2021 11:35 AM

## 2021-03-29 LAB — GLUCOSE, CAPILLARY
Glucose-Capillary: 164 mg/dL — ABNORMAL HIGH (ref 70–99)
Glucose-Capillary: 120 mg/dL — ABNORMAL HIGH (ref 70–99)
Glucose-Capillary: 149 mg/dL — ABNORMAL HIGH (ref 70–99)
Glucose-Capillary: 139 mg/dL — ABNORMAL HIGH (ref 70–99)
Glucose-Capillary: 141 mg/dL — ABNORMAL HIGH (ref 70–99)

## 2021-03-29 LAB — URINALYSIS, MICROSCOPIC (REFLEX)
RBC / HPF: >50 RBC/hpf (ref 0–5)
Squamous Epithelial / HPF: NONE SEEN (ref 0–5)
WBC, UA: >50 WBC/hpf (ref 0–5)

## 2021-03-29 LAB — CBC WITH DIFFERENTIAL/PLATELET
Abs Immature Granulocytes: 0.14 10*3/uL — ABNORMAL HIGH (ref 0.00–0.07)
Basophils Absolute: 0.1 10*3/uL (ref 0.0–0.1)
Basophils Relative: 0 %
Eosinophils Absolute: 0 10*3/uL (ref 0.0–0.5)
Eosinophils Relative: 0 %
HCT: 42.9 % (ref 39.0–52.0)
Hemoglobin: 14.4 g/dL (ref 13.0–17.0)
Immature Granulocytes: 1 %
Lymphocytes Relative: 7 %
Lymphs Abs: 1.7 10*3/uL (ref 0.7–4.0)
MCH: 31.2 pg (ref 26.0–34.0)
MCHC: 33.6 g/dL (ref 30.0–36.0)
MCV: 93.1 fL (ref 80.0–100.0)
Monocytes Absolute: 1.2 10*3/uL — ABNORMAL HIGH (ref 0.1–1.0)
Monocytes Relative: 5 %
Neutro Abs: 19.9 10*3/uL — ABNORMAL HIGH (ref 1.7–7.7)
Neutrophils Relative %: 87 %
Platelets: 333 10*3/uL (ref 150–400)
RBC: 4.61 MIL/uL (ref 4.22–5.81)
RDW: 15.2 % (ref 11.5–15.5)
WBC: 23 10*3/uL — ABNORMAL HIGH (ref 4.0–10.5)
nRBC: 0 % (ref 0.0–0.2)

## 2021-03-29 LAB — URINALYSIS, ROUTINE W REFLEX MICROSCOPIC
Bilirubin Urine: NEGATIVE
Glucose, UA: NEGATIVE mg/dL
Ketones, ur: NEGATIVE mg/dL
Nitrite: NEGATIVE
Protein, ur: NEGATIVE mg/dL
Specific Gravity, Urine: 1.02 (ref 1.005–1.030)
pH: 6 (ref 5.0–8.0)

## 2021-03-29 MED ORDER — HALOPERIDOL 5 MG PO TABS
5.0000 mg | ORAL_TABLET | Freq: Two times a day (BID) | ORAL | Status: DC
  Administered 2021-03-29 – 2021-03-30 (×2): 5 mg via ORAL
  Filled 2021-03-29 (×4): qty 1

## 2021-03-29 MED ORDER — HALOPERIDOL LACTATE 5 MG/ML IJ SOLN
5.0000 mg | Freq: Two times a day (BID) | INTRAMUSCULAR | Status: DC
  Filled 2021-03-29 (×2): qty 1

## 2021-03-29 MED ORDER — HALOPERIDOL LACTATE 5 MG/ML IJ SOLN: 5.0000 mg | Freq: Four times a day (QID) | INTRAMUSCULAR | Status: DC | PRN

## 2021-03-29 MED ORDER — NITROFURANTOIN 25 MG/5ML PO SUSP
100.0000 mg | Freq: Two times a day (BID) | ORAL | Status: AC
  Administered 2021-03-29 – 2021-04-01 (×6): 100 mg
  Filled 2021-03-29 (×6): qty 20

## 2021-03-29 MED ORDER — HALOPERIDOL LACTATE 5 MG/ML IJ SOLN
5.0000 mg | Freq: Two times a day (BID) | INTRAMUSCULAR | Status: DC
  Administered 2021-03-29: 5 mg via INTRAVENOUS
  Filled 2021-03-29 (×2): qty 1

## 2021-03-29 NOTE — Progress Notes (Signed)
UA collected via in and out cath and sent to lab via tube system.

## 2021-03-29 NOTE — Plan of Care (Signed)
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
## 2021-03-29 NOTE — Plan of Care (Signed)
  Problem: Clinical Measurements: Goal: Will remain free from infection Outcome: Progressing Goal: Diagnostic test results will improve Outcome: Progressing   Problem: Nutrition: Goal: Adequate nutrition will be maintained Outcome: Progressing   Problem: Coping: Goal: Level of anxiety will decrease Outcome: Progressing   

## 2021-03-29 NOTE — Progress Notes (Signed)
Inpatient Rehabilitation Admissions Coordinator   I have received insurance approval for CIR, but noted increased WBC. I contacted [place] PA by phone and discussed with Dr Riley Kill. We will await further workup and follow up tomorrow for possible admit. I contacted pt's girlfriend, April, by phone and she is aware.  Ottie Glazier, RN, MSN Rehab Admissions Coordinator [phone] 03/29/2021 12:59 PM
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
## 2021-03-30 ENCOUNTER — Other Ambulatory Visit (HOSPITAL_COMMUNITY): Payer: Self-pay

## 2021-03-30 LAB — CBC WITH DIFFERENTIAL/PLATELET
Abs Immature Granulocytes: 0.14 10*3/uL — ABNORMAL HIGH (ref 0.00–0.07)
Basophils Absolute: 0.1 10*3/uL (ref 0.0–0.1)
Basophils Relative: 0 %
Eosinophils Absolute: 0 10*3/uL (ref 0.0–0.5)
Eosinophils Relative: 0 %
HCT: 38.9 % — ABNORMAL LOW (ref 39.0–52.0)
Hemoglobin: 13.1 g/dL (ref 13.0–17.0)
Immature Granulocytes: 1 %
Lymphocytes Relative: 9 %
Lymphs Abs: 2.4 10*3/uL (ref 0.7–4.0)
MCH: 31 pg (ref 26.0–34.0)
MCHC: 33.7 g/dL (ref 30.0–36.0)
MCV: 92 fL (ref 80.0–100.0)
Monocytes Absolute: 1.8 10*3/uL — ABNORMAL HIGH (ref 0.1–1.0)
Monocytes Relative: 7 %
Neutro Abs: 21.9 10*3/uL — ABNORMAL HIGH (ref 1.7–7.7)
Neutrophils Relative %: 83 %
Platelets: 269 10*3/uL (ref 150–400)
RBC: 4.23 MIL/uL (ref 4.22–5.81)
RDW: 15.1 % (ref 11.5–15.5)
WBC: 26.3 10*3/uL — ABNORMAL HIGH (ref 4.0–10.5)
nRBC: 0 % (ref 0.0–0.2)

## 2021-03-30 LAB — GLUCOSE, CAPILLARY
Glucose-Capillary: 148 mg/dL — ABNORMAL HIGH (ref 70–99)
Glucose-Capillary: 148 mg/dL — ABNORMAL HIGH (ref 70–99)
Glucose-Capillary: 139 mg/dL — ABNORMAL HIGH (ref 70–99)
Glucose-Capillary: 138 mg/dL — ABNORMAL HIGH (ref 70–99)
Glucose-Capillary: 156 mg/dL — ABNORMAL HIGH (ref 70–99)

## 2021-03-30 MED ORDER — PROSOURCE TF PO LIQD
45.0000 mL | Freq: Three times a day (TID) | ORAL | Status: DC
  Administered 2021-03-30 – 2021-04-01 (×6): 45 mL
  Filled 2021-03-30 (×8): qty 45

## 2021-03-30 MED ORDER — ENSURE ENLIVE PO LIQD
237.0000 mL | Freq: Three times a day (TID) | ORAL | Status: DC
  Administered 2021-03-30 – 2021-04-01 (×6): 237 mL via ORAL

## 2021-03-30 MED ORDER — HALOPERIDOL 1 MG PO TABS
2.0000 mg | ORAL_TABLET | Freq: Two times a day (BID) | ORAL | Status: DC
  Administered 2021-03-30 – 2021-03-31 (×2): 2 mg via ORAL
  Filled 2021-03-30 (×3): qty 2

## 2021-03-30 MED ORDER — QUETIAPINE FUMARATE 25 MG PO TABS
50.0000 mg | ORAL_TABLET | Freq: Two times a day (BID) | ORAL | Status: DC
  Administered 2021-03-30 – 2021-03-31 (×2): 50 mg
  Filled 2021-03-30 (×2): qty 2

## 2021-03-30 MED ORDER — CLONAZEPAM 0.5 MG PO TABS
0.5000 mg | ORAL_TABLET | Freq: Two times a day (BID) | ORAL | Status: DC
  Administered 2021-03-30 – 2021-04-01 (×4): 0.5 mg
  Filled 2021-03-30 (×4): qty 1

## 2021-03-30 MED ORDER — OSMOLITE 1.5 CAL PO LIQD
1200.0000 mL | ORAL | Status: DC
  Administered 2021-03-30 – 2021-03-31 (×2): 1200 mL
  Filled 2021-03-30: qty 2000

## 2021-03-30 MED ORDER — HALOPERIDOL LACTATE 5 MG/ML IJ SOLN
2.0000 mg | Freq: Two times a day (BID) | INTRAMUSCULAR | Status: DC
  Filled 2021-03-30: qty 1

## 2021-03-30 MED ORDER — HALOPERIDOL LACTATE 5 MG/ML IJ SOLN: 2.0000 mg | Freq: Two times a day (BID) | INTRAMUSCULAR | Status: DC

## 2021-03-30 NOTE — Plan of Care (Signed)

## 2021-03-30 NOTE — TOC Benefit Eligibility Note (Signed)
Patient Product/process development scientist completed.    The patient is currently admitted and upon discharge could be taking Eliquis 5 mg.  The current 30 day co-pay is, $15.00.   The patient is insured through [facility]     Roland Earl, CPhT Pharmacy Patient Advocate Specialist [facility] Antimicrobial Stewardship Team Direct Number: [phone]  Fax: [phone]
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
## 2021-03-30 NOTE — Progress Notes (Addendum)
   03/30/21 0618  Assess: MEWS Score  Temp 98.9 F (37.2 C)  BP (!) 157/99  Pulse Rate (!) 112  Level of Consciousness Alert  SpO2 95 %  O2 Device Room Air  Patient Activity (if Appropriate) In bed  Assess: MEWS Score  MEWS Temp 0  MEWS Systolic 0  MEWS Pulse 2  MEWS RR 0  MEWS LOC 0  MEWS Score 2  MEWS Score Color Yellow  Assess: if the MEWS score is Yellow or Red  Were vital signs taken at a resting state? Yes  Focused Assessment No change from prior assessment  Early Detection of Sepsis Score *See Row Information* Low  MEWS guidelines implemented *See Row Information* Yes  Treat  MEWS Interventions Administered scheduled meds/treatments  Pain Scale Faces  Pain Score 0  Faces Pain Scale 0  Take Vital Signs  Increase Vital Sign Frequency  Yellow: Q 2hr X 2 then Q 4hr X 2, if remains yellow, continue Q 4hrs  Escalate  MEWS: Escalate Yellow: discuss with charge nurse/RN and consider discussing with provider and RRT  Notify: Charge Nurse/RN  Name of Charge Nurse/RN Notified Jovanny Stephanie,RN  Date Charge Nurse/RN Notified 03/30/21  Time Charge Nurse/RN Notified 0637  Pt HR and BP elevated. Scheduled Metoprolol administered. RN will continue monitor pt.

## 2021-03-30 NOTE — Progress Notes (Addendum)
Progress Note  19 Days Post-Op  Subjective: Worked with speech again yesterday. No further emesis and tolerating Tfs. Afebrile. He only complains of left arm pain but states it is the "same"  Girlfriend is bedside  Objective: Vital signs in last 24 hours: Temp:  [97.8 F (36.6 C)-99.4 F (37.4 C)] 98.9 F (37.2 C) (09/21 0618) Pulse Rate:  [92-117] 112 (09/21 0618) Resp:  [15] 15 (09/20 1639) BP: (137-172)/(80-99) 157/99 (09/21 0618) SpO2:  [94 %-100 %] 95 % (09/21 0618) Last BM Date: 03/25/21  Intake/Output from previous day: 09/20 0701 - 09/21 0700 In: 1296.4 [I.V.:689.7; NG/GT:606.7] Out: 300 [Urine:300] Intake/Output this shift: No intake/output data recorded.  PE: General: WD, male who is laying in bed in NAD. calm HEENT: site of previous trach is healing well - no spreading erythema or purulent drainage. Mouth is pink and moist Heart: regular, rate, and rhythm.  Palpable radial pulses bilaterally Lungs: Respiratory effort nonlabored on room air Abd: soft, NT, ND, G tube in place without surrounding erythema or discharge MSK: all 4 extremities are symmetrical with no cyanosis, clubbing, or edema. Calves soft. LUE with superficial abrasions to hand and lateral elbow - no concerning signs of infection. No gross deformity and NVI Skin: warm and dry. Inspected sacral area which had mepilex in place. Removed and replaced for exam, no skin breakdown Neuro: verbal but somewhat confused. Calm   Lab Results:  Recent Labs    03/29/21 0127 03/30/21 0323  WBC 23.0* 26.3*  HGB 14.4 13.1  HCT 42.9 38.9*  PLT 333 269    BMET Recent Labs    03/28/21 0918  NA 136  K 3.9  CL 101  CO2 26  GLUCOSE 140*  BUN 22*  CREATININE 0.55*  CALCIUM 10.5*    PT/INR No results for input(s): LABPROT, INR in the last 72 hours. CMP     Component Value Date/Time   NA 136 03/28/2021 0918   K 3.9 03/28/2021 0918   CL 101 03/28/2021 0918   CO2 26 03/28/2021 0918   GLUCOSE 140  (H) 03/28/2021 0918   BUN 22 (H) 03/28/2021 0918   CREATININE 0.55 (L) 03/28/2021 0918   CALCIUM 10.5 (H) 03/28/2021 0918   PROT 7.9 03/10/2021 0504   ALBUMIN 2.1 (L) 03/10/2021 0504   AST 160 (H) 03/10/2021 0504   ALT 289 (H) 03/10/2021 0504   ALKPHOS 229 (H) 03/10/2021 0504   BILITOT 0.8 03/10/2021 0504   GFRNONAA >60 03/28/2021 0918   Lipase  No results found for: LIPASE     Studies/Results: DG Chest Port 1 View  Result Date: 03/28/2021 CLINICAL DATA:  Respiratory failure EXAM: PORTABLE CHEST 1 VIEW COMPARISON:  03/22/2021 FINDINGS: Cardiac shadow is within normal limits. Tracheostomy tube is been removed. PICC line is been as well. Lungs are well aerated bilaterally. Persistent right basilar atelectasis is noted but improved from the prior exam. Persistent changes in the left base are stable in appearance. Stable left clavicular fracture is noted. IMPRESSION: Improved aeration in the right base when compare with the prior exam. No new focal abnormality is noted. Electronically Signed   By: Alcide Clever M.D.   On: 03/28/2021 21:24    Anti-infectives: Anti-infectives (From admission, onward)    Start     Dose/Rate Route Frequency Ordered Stop   03/29/21 2200  nitrofurantoin (FURADANTIN) 25 MG/5ML suspension 100 mg        100 mg Per Tube Every 12 hours 03/29/21 1701 04/01/21 2159   03/15/21  0400  vancomycin (VANCOCIN) IVPB 1000 mg/200 mL premix  Status:  Discontinued        1,000 mg 200 mL/hr over 60 Minutes Intravenous Every 8 hours 03/15/21 0125 03/17/21 0928   03/13/21 1200  ceFEPIme (MAXIPIME) 2 g in sodium chloride 0.9 % 100 mL IVPB        2 g 200 mL/hr over 30 Minutes Intravenous Every 8 hours 03/13/21 1103 03/20/21 0429   03/12/21 2200  vancomycin (VANCOREADY) IVPB 750 mg/150 mL  Status:  Discontinued        750 mg 150 mL/hr over 60 Minutes Intravenous Every 8 hours 03/12/21 1537 03/15/21 0125   03/12/21 0530  vancomycin (VANCOREADY) IVPB 750 mg/150 mL  Status:   Discontinued        750 mg 150 mL/hr over 60 Minutes Intravenous Every 8 hours 03/11/21 2145 03/12/21 1340   03/10/21 1400  piperacillin-tazobactam (ZOSYN) IVPB 3.375 g  Status:  Discontinued        3.375 g 12.5 mL/hr over 240 Minutes Intravenous Every 8 hours 03/10/21 0909 03/12/21 1340   03/10/21 1000  vancomycin (VANCOCIN) IVPB 1000 mg/200 mL premix  Status:  Discontinued        1,000 mg 200 mL/hr over 60 Minutes Intravenous Every 8 hours 03/10/21 0908 03/11/21 2145   03/06/21 1415  piperacillin-tazobactam (ZOSYN) IVPB 3.375 g        3.375 g 12.5 mL/hr over 240 Minutes Intravenous Every 8 hours 03/06/21 1324 03/10/21 0902   02/24/21 1200  vancomycin (VANCOCIN) IVPB 1000 mg/200 mL premix        1,000 mg 200 mL/hr over 60 Minutes Intravenous Every 8 hours 02/24/21 0934 03/02/21 2020   02/23/21 0200  vancomycin (VANCOREADY) IVPB 1500 mg/300 mL  Status:  Discontinued        1,500 mg 150 mL/hr over 120 Minutes Intravenous Every 8 hours 02/22/21 1931 02/24/21 0934   02/22/21 2100  vancomycin (VANCOCIN) 250 mg in sodium chloride 0.9 % 100 mL IVPB        250 mg 100 mL/hr over 60 Minutes Intravenous  Once 02/22/21 2012 02/22/21 2150   02/22/21 2030  vancomycin (VANCOCIN) 250 mg in sodium chloride 0.9 % 500 mL IVPB  Status:  Discontinued        250 mg 250 mL/hr over 120 Minutes Intravenous  Once 02/22/21 1931 02/22/21 2012   02/21/21 1800  vancomycin (VANCOREADY) IVPB 1250 mg/250 mL  Status:  Discontinued        1,250 mg 166.7 mL/hr over 90 Minutes Intravenous Every 8 hours 02/21/21 0851 02/22/21 1931   02/21/21 1000  ceFEPIme (MAXIPIME) 2 g in sodium chloride 0.9 % 100 mL IVPB        2 g 200 mL/hr over 30 Minutes Intravenous Every 8 hours 02/21/21 0851 03/02/21 1749   02/21/21 1000  vancomycin (VANCOREADY) IVPB 2000 mg/400 mL        2,000 mg 200 mL/hr over 120 Minutes Intravenous  Once 02/21/21 0851 02/21/21 1306        Assessment/Plan  [facility]   SAH, skull fxs with involvement of  vascular channels - NSGY c/s, Dr. Conchita Paris, keppra x7d for sz ppx, repeat 4V Angio CT of neck 8/21 negative. 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  G1 L ICA injury - ASA 325mg  discontinued when starting eliquis for DVT (9/9) Facial laceration involving left eyelid - ENT c/s, Dr. 07-10-1989, repaired 8/9 Skull fx extending ito L TMJ, L  sphenoid sinus, R mastoid; R nasal bone fx - ENT c/s, Dr. Pollyann Kennedy, repeat exam when more neurologically appropriate R calf DVT - eliquis Acute hypoxic respiratory failure - doing well. Janina Mayo out ID/PNA - 9/6 resp CX Pseud/Acinetobacter - cefepime end 9/11. Ciprodex drops for otitis externa per Dr. Pollyann Kennedy. WBC 26.3. CXR 9/19 without acute change. UA concerning for infection and started on nitrofurantoin 9/20 CV/Hypertensive urgency - metoprolol 150 q8h L clavicle and L scapula fx - non-op per Dr. [name] rash, scattered abrasions - local wound care Alcohol abuse - CIWA H/o tobacco use disorder  FEN - g tube replaced 9/13. FLD per SLP. Re-consulted dietary for TF reccs - hopeful to decrease.  Clonidine protocol completed, oxy now PRN, decreased clonopin/seroquel again today, decreased haldol again today DVT - SCDs, R calf DVT - eliquis ID - nitrofurantoin 9/20>, WBC 26.3 (23), afebrile past 24H Foley - out, voiding well with condom cath - resumed urecholine 9/14  Dispo - med surg, TBI team therapies, decrease Klon/sero and haldol, plan CIR - from our standpoint no concern for acute source of infection other than urine and medically stable for CIR   LOS: 43 days    Eric Form, [facility] 03/30/2021, 7:33 AM Please see Amion for pager number during day hours 7:00am-4:30pm
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
## 2021-03-30 NOTE — Progress Notes (Signed)
Inpatient Rehabilitation Admissions Coordinator   Noted continued increase in WBC and began Nitrofurantoin. No CIR bed to admit him to today. I will follow up with bed availability this week tomorrow.  Ottie Glazier, RN, MSN Rehab Admissions Coordinator [phone] 03/30/2021 1:10 PM

## 2021-03-30 NOTE — Progress Notes (Addendum)
2230 Pt placed in restraints per order. Pt scheduled Haldol administered. RN assessed skin under restraint clean,dry and intact. Pt keeps  pulling his IV,Condom catheter. Pt attempts to gastrostomy/enterostomy tube.RN will continue to monitor pt.    0630 Pt agitated. Pt pulled his condom catheter. Pt attempts to pull GTT. Skin under restraints clean, dry and intact. RN will continue to monitor pt.
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
## 2021-03-31 LAB — CBC WITH DIFFERENTIAL/PLATELET
Abs Immature Granulocytes: 0.06 10*3/uL (ref 0.00–0.07)
Basophils Absolute: 0.1 10*3/uL (ref 0.0–0.1)
Basophils Relative: 0 %
Eosinophils Absolute: 0 10*3/uL (ref 0.0–0.5)
Eosinophils Relative: 0 %
HCT: 40.3 % (ref 39.0–52.0)
Hemoglobin: 13.2 g/dL (ref 13.0–17.0)
Immature Granulocytes: 0 %
Lymphocytes Relative: 15 %
Lymphs Abs: 2.4 10*3/uL (ref 0.7–4.0)
MCH: 30.6 pg (ref 26.0–34.0)
MCHC: 32.8 g/dL (ref 30.0–36.0)
MCV: 93.5 fL (ref 80.0–100.0)
Monocytes Absolute: 1.3 10*3/uL — ABNORMAL HIGH (ref 0.1–1.0)
Monocytes Relative: 8 %
Neutro Abs: 12.3 10*3/uL — ABNORMAL HIGH (ref 1.7–7.7)
Neutrophils Relative %: 77 %
Platelets: 272 10*3/uL (ref 150–400)
RBC: 4.31 MIL/uL (ref 4.22–5.81)
RDW: 15 % (ref 11.5–15.5)
WBC: 16.1 10*3/uL — ABNORMAL HIGH (ref 4.0–10.5)
nRBC: 0 % (ref 0.0–0.2)

## 2021-03-31 LAB — GLUCOSE, CAPILLARY
Glucose-Capillary: 135 mg/dL — ABNORMAL HIGH (ref 70–99)
Glucose-Capillary: 141 mg/dL — ABNORMAL HIGH (ref 70–99)
Glucose-Capillary: 127 mg/dL — ABNORMAL HIGH (ref 70–99)
Glucose-Capillary: 132 mg/dL — ABNORMAL HIGH (ref 70–99)
Glucose-Capillary: 122 mg/dL — ABNORMAL HIGH (ref 70–99)

## 2021-03-31 MED ORDER — QUETIAPINE FUMARATE 25 MG PO TABS: 50.0000 mg | ORAL_TABLET | Freq: Every day | ORAL | Status: DC
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
## 2021-03-31 NOTE — Progress Notes (Signed)
Inpatient Rehabilitation Admissions Coordinator   I do not have a CIR bed for admit today. I spoke with April by phone and she is aware. I have alerted acute team and TOC. I will follow up tomorrow.  Ottie Glazier, RN, MSN Rehab Admissions Coordinator [phone] 03/31/2021 11:04 AM

## 2021-03-31 NOTE — Progress Notes (Addendum)
   03/31/21 1423  Assess: MEWS Score  Temp 100 F (37.8 C)  BP (!) 162/104  ECG Heart Rate (!) 106  Resp (!) 22  Level of Consciousness Alert  Assess: MEWS Score  MEWS Temp 0  MEWS Systolic 0  MEWS Pulse 1  MEWS RR 1  MEWS LOC 0  MEWS Score 2  MEWS Score Color Yellow  Assess: if the MEWS score is Yellow or Red  Were vital signs taken at a resting state? Yes  Focused Assessment Change from prior assessment (see assessment flowsheet)  Early Detection of Sepsis Score *See Row Information* Low  MEWS guidelines implemented *See Row Information* Yes  Treat  MEWS Interventions Administered scheduled meds/treatments  Pain Scale 0-10  Pain Score 0  Take Vital Signs  Increase Vital Sign Frequency  Yellow: Q 2hr X 2 then Q 4hr X 2, if remains yellow, continue Q 4hrs  Escalate  MEWS: Escalate Yellow: discuss with charge nurse/RN and consider discussing with provider and RRT  Notify: Charge Nurse/RN  Name of Charge Nurse/RN Notified Talmo, RN  Date Charge Nurse/RN Notified 03/31/21  Time Charge Nurse/RN Notified 1513  Notify: Provider  Provider Name/Title Kabrich  Date Provider Notified 03/31/21  Time Provider Notified 1513  Notification Type Page  Notification Reason Change in status  Provider response No new orders  Date of Provider Response 03/31/21  Time of Provider Response 1514   Patients respiratory rate, pulse rate and temp increased. Scheduled metoprolol was given.    Trauma was paged, no new orders at this time. Consulting civil engineer notified.

## 2021-03-31 NOTE — TOC Progression Note (Signed)
Transition of Care [facility]) - Progression Note    Patient Details  Name: YIANNI SKILLING MRN: [id number] Date of Birth: 1986-07-03  [facility] of [facility] CM/SW Contact  Glennon Mac, RN Phone Number: 03/31/2021, 4:06 PM  Clinical Narrative:    Spoke with April, patient's significant other, regarding question she has about insurance.   She apparently received a letter in the mail stating that patient's hospital stay has been denied.  Spoke with [facility] in UR department; she states that patient's hospital stay has been approved up until 03/29/2021, and clinical information has been sent today as an update for the remainder of hospital stay.  Reassured significant other that she should likely receive another letter soon regarding approval.  She is appreciative of my help.  Expected Discharge Plan: Skilled Nursing Facility Barriers to Discharge: Continued Medical Work up  Expected Discharge Plan and Services Expected Discharge Plan: IP Rehab Discharge Planning Services: CM Consult   Living arrangements for the past 2 months: Single Family Home Expected Discharge Date: 03/30/21                                     Social Determinants of Health (SDOH) Interventions    Readmission Risk Interventions No flowsheet data found.  Quintella Baton, RN, BSN  Trauma/Neuro ICU Case Manager [phone]
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
## 2021-03-31 NOTE — Plan of Care (Signed)
  Problem: Health Behavior/Discharge Planning: Goal: Ability to manage health-related needs will improve Outcome: Progressing   Problem: Clinical Measurements: Goal: Ability to maintain clinical measurements within normal limits will improve Outcome: Progressing Goal: Will remain free from infection Outcome: Progressing   

## 2021-04-01 ENCOUNTER — Inpatient Hospital Stay (HOSPITAL_COMMUNITY)
Admission: RE | Admit: 2021-04-01 | Discharge: 2021-04-16 | DRG: 945 | Disposition: A | Discharge status: Home / Self Care | Payer: 59 | Source: Intra-hospital | Attending: Physical Medicine & Rehabilitation | Admitting: Physical Medicine & Rehabilitation

## 2021-04-01 ENCOUNTER — Inpatient Hospital Stay (HOSPITAL_COMMUNITY): Payer: 59

## 2021-04-01 ENCOUNTER — Other Ambulatory Visit: Payer: Self-pay

## 2021-04-01 ENCOUNTER — Encounter (HOSPITAL_COMMUNITY): Payer: Self-pay | Admitting: Physical Medicine & Rehabilitation

## 2021-04-01 DIAGNOSIS — R000 Tachycardia, unspecified: Secondary | ICD-10-CM

## 2021-04-01 DIAGNOSIS — Z931 Gastrostomy status: Secondary | ICD-10-CM | POA: Diagnosis not present

## 2021-04-01 DIAGNOSIS — S0291XA Unspecified fracture of skull, initial encounter for closed fracture: Secondary | ICD-10-CM | POA: Diagnosis present

## 2021-04-01 DIAGNOSIS — R41844 Frontal lobe and executive function deficit: Secondary | ICD-10-CM | POA: Diagnosis not present

## 2021-04-01 DIAGNOSIS — R1310 Dysphagia, unspecified: Secondary | ICD-10-CM | POA: Diagnosis present

## 2021-04-01 DIAGNOSIS — S069X0D Unspecified intracranial injury without loss of consciousness, subsequent encounter: Secondary | ICD-10-CM | POA: Diagnosis not present

## 2021-04-01 DIAGNOSIS — M25512 Pain in left shoulder: Secondary | ICD-10-CM | POA: Diagnosis not present

## 2021-04-01 DIAGNOSIS — E876 Hypokalemia: Secondary | ICD-10-CM | POA: Diagnosis present

## 2021-04-01 DIAGNOSIS — S020XXD Fracture of vault of skull, subsequent encounter for fracture with routine healing: Secondary | ICD-10-CM

## 2021-04-01 DIAGNOSIS — S069X9A Unspecified intracranial injury with loss of consciousness of unspecified duration, initial encounter: Secondary | ICD-10-CM | POA: Diagnosis present

## 2021-04-01 DIAGNOSIS — I82409 Acute embolism and thrombosis of unspecified deep veins of unspecified lower extremity: Secondary | ICD-10-CM | POA: Diagnosis not present

## 2021-04-01 DIAGNOSIS — J151 Pneumonia due to Pseudomonas: Secondary | ICD-10-CM

## 2021-04-01 DIAGNOSIS — I10 Essential (primary) hypertension: Secondary | ICD-10-CM | POA: Diagnosis present

## 2021-04-01 DIAGNOSIS — Z87891 Personal history of nicotine dependence: Secondary | ICD-10-CM | POA: Diagnosis not present

## 2021-04-01 DIAGNOSIS — S069X0A Unspecified intracranial injury without loss of consciousness, initial encounter: Secondary | ICD-10-CM

## 2021-04-01 DIAGNOSIS — S069X3S Unspecified intracranial injury with loss of consciousness of 1 hour to 5 hours 59 minutes, sequela: Secondary | ICD-10-CM

## 2021-04-01 DIAGNOSIS — N390 Urinary tract infection, site not specified: Secondary | ICD-10-CM | POA: Diagnosis not present

## 2021-04-01 DIAGNOSIS — S066X9D Traumatic subarachnoid hemorrhage with loss of consciousness of unspecified duration, subsequent encounter: Principal | ICD-10-CM

## 2021-04-01 DIAGNOSIS — S0181XA Laceration without foreign body of other part of head, initial encounter: Secondary | ICD-10-CM

## 2021-04-01 DIAGNOSIS — R062 Wheezing: Secondary | ICD-10-CM

## 2021-04-01 DIAGNOSIS — R339 Retention of urine, unspecified: Secondary | ICD-10-CM | POA: Diagnosis present

## 2021-04-01 DIAGNOSIS — I824Z1 Acute embolism and thrombosis of unspecified deep veins of right distal lower extremity: Secondary | ICD-10-CM | POA: Diagnosis present

## 2021-04-01 DIAGNOSIS — S42022S Displaced fracture of shaft of left clavicle, sequela: Secondary | ICD-10-CM | POA: Diagnosis not present

## 2021-04-01 DIAGNOSIS — S42022D Displaced fracture of shaft of left clavicle, subsequent encounter for fracture with routine healing: Secondary | ICD-10-CM | POA: Diagnosis not present

## 2021-04-01 DIAGNOSIS — S022XXD Fracture of nasal bones, subsequent encounter for fracture with routine healing: Secondary | ICD-10-CM

## 2021-04-01 DIAGNOSIS — Z781 Physical restraint status: Secondary | ICD-10-CM

## 2021-04-01 DIAGNOSIS — J189 Pneumonia, unspecified organism: Secondary | ICD-10-CM | POA: Diagnosis not present

## 2021-04-01 DIAGNOSIS — Z7901 Long term (current) use of anticoagulants: Secondary | ICD-10-CM | POA: Diagnosis not present

## 2021-04-01 DIAGNOSIS — F1020 Alcohol dependence, uncomplicated: Secondary | ICD-10-CM | POA: Diagnosis present

## 2021-04-01 DIAGNOSIS — S069X0S Unspecified intracranial injury without loss of consciousness, sequela: Secondary | ICD-10-CM | POA: Diagnosis not present

## 2021-04-01 DIAGNOSIS — R059 Cough, unspecified: Secondary | ICD-10-CM

## 2021-04-01 DIAGNOSIS — S42112D Displaced fracture of body of scapula, left shoulder, subsequent encounter for fracture with routine healing: Secondary | ICD-10-CM | POA: Diagnosis not present

## 2021-04-01 DIAGNOSIS — R1312 Dysphagia, oropharyngeal phase: Secondary | ICD-10-CM

## 2021-04-01 DIAGNOSIS — I609 Nontraumatic subarachnoid hemorrhage, unspecified: Secondary | ICD-10-CM

## 2021-04-01 DIAGNOSIS — J9600 Acute respiratory failure, unspecified whether with hypoxia or hypercapnia: Secondary | ICD-10-CM | POA: Diagnosis not present

## 2021-04-01 DIAGNOSIS — T1490XA Injury, unspecified, initial encounter: Secondary | ICD-10-CM | POA: Diagnosis present

## 2021-04-01 DIAGNOSIS — Z79899 Other long term (current) drug therapy: Secondary | ICD-10-CM

## 2021-04-01 DIAGNOSIS — R52 Pain, unspecified: Secondary | ICD-10-CM

## 2021-04-01 DIAGNOSIS — S069XAA Unspecified intracranial injury with loss of consciousness status unknown, initial encounter: Secondary | ICD-10-CM | POA: Diagnosis present

## 2021-04-01 DIAGNOSIS — T07XXXA Unspecified multiple injuries, initial encounter: Secondary | ICD-10-CM | POA: Diagnosis not present

## 2021-04-01 DIAGNOSIS — R531 Weakness: Secondary | ICD-10-CM | POA: Diagnosis present

## 2021-04-01 DIAGNOSIS — S42112A Displaced fracture of body of scapula, left shoulder, initial encounter for closed fracture: Secondary | ICD-10-CM | POA: Diagnosis present

## 2021-04-01 DIAGNOSIS — S42009A Fracture of unspecified part of unspecified clavicle, initial encounter for closed fracture: Secondary | ICD-10-CM | POA: Diagnosis present

## 2021-04-01 LAB — GLUCOSE, CAPILLARY
Glucose-Capillary: 130 mg/dL — ABNORMAL HIGH (ref 70–99)
Glucose-Capillary: 139 mg/dL — ABNORMAL HIGH (ref 70–99)
Glucose-Capillary: 111 mg/dL — ABNORMAL HIGH (ref 70–99)

## 2021-04-01 LAB — CBC WITH DIFFERENTIAL/PLATELET
Abs Immature Granulocytes: 0.04 10*3/uL (ref 0.00–0.07)
Basophils Absolute: 0.1 10*3/uL (ref 0.0–0.1)
Basophils Relative: 1 %
Eosinophils Absolute: 0 10*3/uL (ref 0.0–0.5)
Eosinophils Relative: 0 %
HCT: 39.5 % (ref 39.0–52.0)
Hemoglobin: 13.1 g/dL (ref 13.0–17.0)
Immature Granulocytes: 0 %
Lymphocytes Relative: 20 %
Lymphs Abs: 2.3 10*3/uL (ref 0.7–4.0)
MCH: 30.8 pg (ref 26.0–34.0)
MCHC: 33.2 g/dL (ref 30.0–36.0)
MCV: 92.7 fL (ref 80.0–100.0)
Monocytes Absolute: 1 10*3/uL (ref 0.1–1.0)
Monocytes Relative: 9 %
Neutro Abs: 8.3 10*3/uL — ABNORMAL HIGH (ref 1.7–7.7)
Neutrophils Relative %: 70 %
Platelets: 287 10*3/uL (ref 150–400)
RBC: 4.26 MIL/uL (ref 4.22–5.81)
RDW: 14.8 % (ref 11.5–15.5)
WBC: 11.7 10*3/uL — ABNORMAL HIGH (ref 4.0–10.5)
nRBC: 0 % (ref 0.0–0.2)

## 2021-04-01 MED ORDER — FOLIC ACID 1 MG PO TABS
1.0000 mg | ORAL_TABLET | Freq: Every day | ORAL | Status: DC
  Administered 2021-04-02 – 2021-04-05 (×4): 1 mg
  Filled 2021-04-01 (×4): qty 1

## 2021-04-01 MED ORDER — ONDANSETRON HCL 4 MG/2ML IJ SOLN
4.0000 mg | Freq: Four times a day (QID) | INTRAMUSCULAR | Status: DC | PRN
  Administered 2021-04-06: 4 mg via INTRAVENOUS
  Filled 2021-04-01: qty 2

## 2021-04-01 MED ORDER — BACITRACIN ZINC 500 UNIT/GM EX OINT
TOPICAL_OINTMENT | Freq: Two times a day (BID) | CUTANEOUS | Status: DC
  Administered 2021-04-03: 1 via TOPICAL
  Filled 2021-04-01 (×6): qty 28.35

## 2021-04-01 MED ORDER — OSMOLITE 1.5 CAL PO LIQD
1200.0000 mL | ORAL | Status: DC
  Administered 2021-04-01: 1200 mL
  Filled 2021-04-01: qty 2000

## 2021-04-01 MED ORDER — HYDRALAZINE HCL 10 MG PO TABS
10.0000 mg | ORAL_TABLET | Freq: Three times a day (TID) | ORAL | Status: DC | PRN
  Administered 2021-04-01 – 2021-04-03 (×3): 10 mg via ORAL
  Filled 2021-04-01 (×3): qty 1

## 2021-04-01 MED ORDER — FREE WATER
200.0000 mL | Freq: Three times a day (TID) | Status: DC
  Administered 2021-04-01 – 2021-04-14 (×34): 200 mL

## 2021-04-01 MED ORDER — PANTOPRAZOLE 2 MG/ML SUSPENSION
40.0000 mg | Freq: Every day | ORAL | Status: DC
  Administered 2021-04-02 – 2021-04-05 (×4): 40 mg
  Filled 2021-04-01 (×4): qty 20

## 2021-04-01 MED ORDER — ADULT MULTIVITAMIN W/MINERALS CH
1.0000 | ORAL_TABLET | Freq: Every day | ORAL | Status: DC
  Administered 2021-04-02 – 2021-04-05 (×4): 1
  Filled 2021-04-01 (×4): qty 1

## 2021-04-01 MED ORDER — THIAMINE HCL 100 MG/ML IJ SOLN
100.0000 mg | Freq: Every day | INTRAMUSCULAR | Status: DC
  Administered 2021-04-02: 100 mg via INTRAVENOUS
  Filled 2021-04-01 (×4): qty 1

## 2021-04-01 MED ORDER — LOPERAMIDE HCL 1 MG/7.5ML PO SUSP
2.0000 mg | ORAL | Status: DC | PRN
  Filled 2021-04-01: qty 15

## 2021-04-01 MED ORDER — DOCUSATE SODIUM 50 MG/5ML PO LIQD: 100.0000 mg | Freq: Two times a day (BID) | ORAL | Status: DC | PRN

## 2021-04-01 MED ORDER — LISINOPRIL 10 MG PO TABS
10.0000 mg | ORAL_TABLET | Freq: Every day | ORAL | Status: DC
  Administered 2021-04-01 – 2021-04-04 (×4): 10 mg via ORAL
  Filled 2021-04-01 (×4): qty 1

## 2021-04-01 MED ORDER — QUETIAPINE FUMARATE 50 MG PO TABS: 50.0000 mg | ORAL_TABLET | Freq: Every evening | ORAL | Status: DC | PRN

## 2021-04-01 MED ORDER — APIXABAN 5 MG PO TABS
5.0000 mg | ORAL_TABLET | Freq: Two times a day (BID) | ORAL | Status: DC
  Administered 2021-04-01 – 2021-04-05 (×8): 5 mg
  Filled 2021-04-01 (×8): qty 1

## 2021-04-01 MED ORDER — QUETIAPINE FUMARATE 50 MG PO TABS
50.0000 mg | ORAL_TABLET | Freq: Every day | ORAL | Status: DC
  Administered 2021-04-01 – 2021-04-04 (×3): 50 mg
  Filled 2021-04-01 (×4): qty 1

## 2021-04-01 MED ORDER — THIAMINE HCL 100 MG PO TABS
100.0000 mg | ORAL_TABLET | Freq: Every day | ORAL | Status: DC
  Administered 2021-04-03 – 2021-04-05 (×3): 100 mg
  Filled 2021-04-01 (×4): qty 1

## 2021-04-01 MED ORDER — METOPROLOL TARTRATE 50 MG PO TABS
150.0000 mg | ORAL_TABLET | Freq: Three times a day (TID) | ORAL | Status: DC
  Administered 2021-04-01 – 2021-04-05 (×11): 150 mg
  Filled 2021-04-01 (×11): qty 3

## 2021-04-01 MED ORDER — ENSURE ENLIVE PO LIQD
237.0000 mL | Freq: Three times a day (TID) | ORAL | Status: DC
  Administered 2021-04-01 – 2021-04-03 (×3): 237 mL via ORAL

## 2021-04-01 MED ORDER — CLONAZEPAM 0.5 MG PO TABS
0.5000 mg | ORAL_TABLET | Freq: Two times a day (BID) | ORAL | Status: DC
  Administered 2021-04-01 – 2021-04-05 (×6): 0.5 mg
  Filled 2021-04-01 (×8): qty 1

## 2021-04-01 MED ORDER — ACETAMINOPHEN 325 MG PO TABS
325.0000 mg | ORAL_TABLET | ORAL | Status: DC | PRN
  Administered 2021-04-04: 650 mg
  Filled 2021-04-01: qty 2

## 2021-04-01 MED ORDER — POLYETHYLENE GLYCOL 3350 17 G PO PACK: 17.0000 g | PACK | Freq: Every day | ORAL | Status: DC | PRN

## 2021-04-01 MED ORDER — BETHANECHOL CHLORIDE 25 MG PO TABS
25.0000 mg | ORAL_TABLET | Freq: Three times a day (TID) | ORAL | Status: DC
  Administered 2021-04-01 – 2021-04-03 (×5): 25 mg
  Filled 2021-04-01 (×5): qty 1

## 2021-04-01 MED ORDER — METHOCARBAMOL 500 MG PO TABS
1000.0000 mg | ORAL_TABLET | Freq: Three times a day (TID) | ORAL | Status: DC | PRN
  Administered 2021-04-03: 1000 mg
  Filled 2021-04-01: qty 2

## 2021-04-01 MED ORDER — ONDANSETRON 4 MG PO TBDP
4.0000 mg | ORAL_TABLET | Freq: Four times a day (QID) | ORAL | Status: DC | PRN
  Administered 2021-04-07: 4 mg via ORAL
  Filled 2021-04-01 (×2): qty 1

## 2021-04-01 MED ORDER — OXYCODONE HCL 5 MG/5ML PO SOLN
5.0000 mg | ORAL | Status: DC | PRN
  Administered 2021-04-02: 10 mg
  Administered 2021-04-02 – 2021-04-04 (×2): 5 mg
  Filled 2021-04-01: qty 10
  Filled 2021-04-01: qty 5
  Filled 2021-04-01 (×2): qty 10

## 2021-04-01 MED ORDER — NUTRISOURCE FIBER PO PACK
1.0000 | PACK | Freq: Two times a day (BID) | ORAL | Status: DC
  Administered 2021-04-02 – 2021-04-05 (×7): 1
  Filled 2021-04-01 (×9): qty 1

## 2021-04-01 MED ORDER — PROSOURCE TF PO LIQD
45.0000 mL | Freq: Three times a day (TID) | ORAL | Status: DC
  Administered 2021-04-01 – 2021-04-04 (×10): 45 mL
  Filled 2021-04-01 (×9): qty 45

## 2021-04-01 NOTE — Progress Notes (Signed)
Pt just starting dinner, will hold off on tube feeding at this time.
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
## 2021-04-01 NOTE — Progress Notes (Signed)
Progress Note  21 Days Post-Op  Subjective: In better spirits and more talkative this am. Nursing and OT bedside. He denies complaints and is eager to go to CIR when able  Objective: Vital signs in last 24 hours: Temp:  [98.2 F (36.8 C)-100 F (37.8 C)] 99.9 F (37.7 C) (09/22 2006) Pulse Rate:  [86-101] 96 (09/23 0630) Resp:  [16-23] 23 (09/22 2006) BP: (154-164)/(104-107) 164/107 (09/23 0630) SpO2:  [90 %-96 %] 90 % (09/22 2006) Last BM Date: 04/01/21  Intake/Output from previous day: 09/22 0701 - 09/23 0700 In: -  Out: 1100 [Urine:1100] Intake/Output this shift: No intake/output data recorded.  PE: General: WD, male who is laying in bed in NAD. calm HEENT: site of previous trach is healing well - no spreading erythema or purulent drainage. Mouth is pink and moist Heart: regular, rate, and rhythm.  Palpable radial pulses bilaterally Lungs: CTAB. Respiratory effort nonlabored on room air Abd: soft, NT, ND, G tube in place without surrounding erythema or discharge MSK: all 4 extremities are symmetrical with no cyanosis, clubbing, or edema. Calves soft. LUE with superficial abrasions to hand and lateral elbow - no concerning signs of infection. No gross deformity and NVI Skin: warm and dry Neuro: verbal. Calm and cooperative   Lab Results:  Recent Labs    03/31/21 0131 04/01/21 0126  WBC 16.1* 11.7*  HGB 13.2 13.1  HCT 40.3 39.5  PLT 272 287    BMET No results for input(s): NA, K, CL, CO2, GLUCOSE, BUN, CREATININE, CALCIUM in the last 72 hours.  PT/INR No results for input(s): LABPROT, INR in the last 72 hours. CMP     Component Value Date/Time   NA 136 03/28/2021 0918   K 3.9 03/28/2021 0918   CL 101 03/28/2021 0918   CO2 26 03/28/2021 0918   GLUCOSE 140 (H) 03/28/2021 0918   BUN 22 (H) 03/28/2021 0918   CREATININE 0.55 (L) 03/28/2021 0918   CALCIUM 10.5 (H) 03/28/2021 0918   PROT 7.9 03/10/2021 0504   ALBUMIN 2.1 (L) 03/10/2021 0504   AST 160 (H)  03/10/2021 0504   ALT 289 (H) 03/10/2021 0504   ALKPHOS 229 (H) 03/10/2021 0504   BILITOT 0.8 03/10/2021 0504   GFRNONAA >60 03/28/2021 0918   Lipase  No results found for: LIPASE     Studies/Results: No results found.  Anti-infectives: Anti-infectives (From admission, onward)    Start     Dose/Rate Route Frequency Ordered Stop   03/29/21 2200  nitrofurantoin (FURADANTIN) 25 MG/5ML suspension 100 mg        100 mg Per Tube Every 12 hours 03/29/21 1701 04/01/21 2159   03/15/21 0400  vancomycin (VANCOCIN) IVPB 1000 mg/200 mL premix  Status:  Discontinued        1,000 mg 200 mL/hr over 60 Minutes Intravenous Every 8 hours 03/15/21 0125 03/17/21 0928   03/13/21 1200  ceFEPIme (MAXIPIME) 2 g in sodium chloride 0.9 % 100 mL IVPB        2 g 200 mL/hr over 30 Minutes Intravenous Every 8 hours 03/13/21 1103 03/20/21 0429   03/12/21 2200  vancomycin (VANCOREADY) IVPB 750 mg/150 mL  Status:  Discontinued        750 mg 150 mL/hr over 60 Minutes Intravenous Every 8 hours 03/12/21 1537 03/15/21 0125   03/12/21 0530  vancomycin (VANCOREADY) IVPB 750 mg/150 mL  Status:  Discontinued        750 mg 150 mL/hr over 60 Minutes Intravenous Every 8  hours 03/11/21 2145 03/12/21 1340   03/10/21 1400  piperacillin-tazobactam (ZOSYN) IVPB 3.375 g  Status:  Discontinued        3.375 g 12.5 mL/hr over 240 Minutes Intravenous Every 8 hours 03/10/21 0909 03/12/21 1340   03/10/21 1000  vancomycin (VANCOCIN) IVPB 1000 mg/200 mL premix  Status:  Discontinued        1,000 mg 200 mL/hr over 60 Minutes Intravenous Every 8 hours 03/10/21 0908 03/11/21 2145   03/06/21 1415  piperacillin-tazobactam (ZOSYN) IVPB 3.375 g        3.375 g 12.5 mL/hr over 240 Minutes Intravenous Every 8 hours 03/06/21 1324 03/10/21 0902   02/24/21 1200  vancomycin (VANCOCIN) IVPB 1000 mg/200 mL premix        1,000 mg 200 mL/hr over 60 Minutes Intravenous Every 8 hours 02/24/21 0934 03/02/21 2020   02/23/21 0200  vancomycin  (VANCOREADY) IVPB 1500 mg/300 mL  Status:  Discontinued        1,500 mg 150 mL/hr over 120 Minutes Intravenous Every 8 hours 02/22/21 1931 02/24/21 0934   02/22/21 2100  vancomycin (VANCOCIN) 250 mg in sodium chloride 0.9 % 100 mL IVPB        250 mg 100 mL/hr over 60 Minutes Intravenous  Once 02/22/21 2012 02/22/21 2150   02/22/21 2030  vancomycin (VANCOCIN) 250 mg in sodium chloride 0.9 % 500 mL IVPB  Status:  Discontinued        250 mg 250 mL/hr over 120 Minutes Intravenous  Once 02/22/21 1931 02/22/21 2012   02/21/21 1800  vancomycin (VANCOREADY) IVPB 1250 mg/250 mL  Status:  Discontinued        1,250 mg 166.7 mL/hr over 90 Minutes Intravenous Every 8 hours 02/21/21 0851 02/22/21 1931   02/21/21 1000  ceFEPIme (MAXIPIME) 2 g in sodium chloride 0.9 % 100 mL IVPB        2 g 200 mL/hr over 30 Minutes Intravenous Every 8 hours 02/21/21 0851 03/02/21 1749   02/21/21 1000  vancomycin (VANCOREADY) IVPB 2000 mg/400 mL        2,000 mg 200 mL/hr over 120 Minutes Intravenous  Once 02/21/21 0851 02/21/21 1306        Assessment/Plan  [facility]   SAH, 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 Conchita Paris, keppra x7d for sz ppx, repeat 4V Angio CT of neck 8/21 negative. 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  G1 L ICA injury - ASA 325mg  discontinued when starting eliquis for DVT (9/9) Facial laceration involving left eyelid - ENT c/s, Dr. 07-10-1989, repaired 8/9 Skull fx extending ito L TMJ, L sphenoid sinus, R mastoid; R nasal bone fx - ENT c/s, Dr. 10/9, repeat exam when more neurologically appropriate R calf DVT - eliquis Acute hypoxic respiratory failure - doing well. Pollyann Kennedy out ID/PNA - 9/6 resp CX Pseud/Acinetobacter - cefepime end 9/11. Ciprodex drops for otitis externa per Dr. 11/11. CXR 9/19 without acute change and improved aeration. UA concerning for infection and started on nitrofurantoin 9/20. Afebrile and WBC downtrending CV/Hypertensive urgency -  metoprolol 150 q8h L clavicle and L scapula fx - non-op per Dr. 10/20 Road rash, scattered abrasions - local wound care Alcohol abuse - CIWA H/o tobacco use disorder  FEN - g tube replaced 9/13. D2 per SLP. Nocturnal Tfs. Appreciate dietary reccs Clonidine protocol completed, oxy now PRN, weaning clonopin/seroquel again, of scheduled haldol (available PRN) DVT - SCDs, R calf DVT - eliquis ID -  nitrofurantoin 9/20>9/23, WBC 11.7, afebrile past 24H Foley - out, voiding well with condom cath - resumed urecholine 9/14  Dispo - TBI team therapies, decrease Klon/sero, plan CIR - from our standpoint medically stable for CIR   LOS: 45 days    Eric Form, [facility] 04/01/2021, 8:00 AM Please see Amion for pager number during day hours 7:00am-4:30pm
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
## 2021-04-01 NOTE — Discharge Instructions (Addendum)
Inpatient Rehab Discharge Instructions  Scott [name] Discharge date and time: No discharge date for patient encounter.   Activities/Precautions/ Functional Status: Activity:  Nonweightbearing left upper extremity Diet: Regular Wound Care: Routine skin checks Functional status:  ___ No restrictions     ___ Walk up steps independently ___ 24/7 supervision/assistance   ___ Walk up steps with assistance ___ Intermittent supervision/assistance  ___ Bathe/dress independently ___ Walk with walker     __x_ Bathe/dress with assistance ___ Walk Independently    ___ Shower independently ___ Walk with assistance    ___ Shower with assistance ___ No alcohol     ___ Return to work/school ________  Special Instructions: No driving smoking or alcohol  Follow-up trauma services Dr.Lovick for removal of PEG tube [phone]   COMMUNITY REFERRALS UPON DISCHARGE:    Outpatient: PT, OT, SP             Agency: [facility]                                       ADDRESS: [address] [place], [place]                                       Phone: [phone]             Appointment Date/Time:WILL CALL April TO SET UP APPOINTMENTS  Medical Equipment/Items Ordered: None                                                 Agency/Supplier: N/A    My questions have been answered and I understand these instructions. I will adhere to these goals and the provided educational materials after my discharge from the hospital.  Patient/Caregiver Signature _______________________________ Date __________  Clinician Signature _______________________________________ Date __________  Please bring this form and your medication list with you to all your follow-up doctor's appointments.    Information on my medicine - ELIQUIS (apixaban)  This medication education was reviewed with me or my healthcare representative as part of my discharge preparation.  The pharmacist that spoke with me during my  hospital stay was:    Why was Eliquis prescribed for you? Eliquis was prescribed to treat blood clots that may have been found in the veins of your legs (deep vein thrombosis) or in your lungs (pulmonary embolism) and to reduce the risk of them occurring again.  What do You need to know about Eliquis ? Continue Eliquis 5 mg tablet taken TWICE daily.  Eliquis may be taken with or without food.   Try to take the dose about the same time in the morning and in the evening. If you have difficulty swallowing the tablet whole please discuss with your pharmacist how to take the medication safely.  Take Eliquis exactly as prescribed and DO NOT stop taking Eliquis without talking to the doctor who prescribed the medication.  Stopping may increase your risk of developing a new blood clot.  Refill your prescription before you run out.  After discharge, you should have regular check-up appointments with your healthcare provider that is prescribing your Eliquis.    What do you do if you  miss a dose? If a dose of ELIQUIS is not taken at the scheduled time, take it as soon as possible on the same day and twice-daily administration should be resumed. The dose should not be doubled to make up for a missed dose.  Important Safety Information A possible side effect of Eliquis is bleeding. You should call your healthcare provider right away if you experience any of the following: Bleeding from an injury or your nose that does not stop. Unusual colored urine (red or dark brown) or unusual colored stools (red or black). Unusual bruising for unknown reasons. A serious fall or if you hit your head (even if there is no bleeding).  Some medicines may interact with Eliquis and might increase your risk of bleeding or clotting while on Eliquis. To help avoid this, consult your healthcare provider or pharmacist prior to using any new prescription or non-prescription medications, including herbals, vitamins,  non-steroidal anti-inflammatory drugs (NSAIDs) and supplements.  This website has more information on Eliquis (apixaban): http://www.eliquis.com/eliquis/home

## 2021-04-01 NOTE — Plan of Care (Signed)
  Problem: Clinical Measurements: Goal: Ability to maintain clinical measurements within normal limits will improve Outcome: Progressing   Problem: Activity: Goal: Risk for activity intolerance will decrease Outcome: Progressing   Problem: Nutrition: Goal: Adequate nutrition will be maintained Outcome: Progressing   Problem: Pain Managment: Goal: General experience of comfort will improve Outcome: Progressing   Problem: Safety: Goal: Ability to remain free from injury will improve Outcome: Progressing   Problem: Skin Integrity: Goal: Risk for impaired skin integrity will decrease Outcome: Progressing   

## 2021-04-01 NOTE — Progress Notes (Signed)
Inpatient Rehabilitation Admissions Coordinator    CIR bed is available to admit today. I contacted Trauma PA, Johnny Bridge and she will prepare d/c. I spoke with girlfriend, April and she is in agreement to admit. I have alerted acute team and TOC. I will make the arrangements to admit today.  Ottie Glazier, RN, MSN Rehab Admissions Coordinator [phone] 04/01/2021 10:25 AM
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
## 2021-04-01 NOTE — Progress Notes (Addendum)
Pt girlfriend reports pt left wrist is swollen and this is new, pt does pull away when attempt to touch. She is also concerned with mitts being used that he gets out of them and will start pulling on bed rails, pt already grabbing at bed rails while this nurse in room. Informed her that will contact MD. Called on call, rec'd new orders, will update girlfriend.

## 2021-04-01 NOTE — Progress Notes (Signed)
Inpatient Rehabilitation Medication Review by a Pharmacist  A complete drug regimen review was completed for this patient to identify any potential clinically significant medication issues.  High Risk Drug Classes Is patient taking? Indication by Medication  Antipsychotic Yes Quetiapine for mood  Anticoagulant Yes Apixaban for DVT treatment  Antibiotic No   Opioid Yes Oxycodone PRN pain  Antiplatelet No   Hypoglycemics/insulin No   Vasoactive Medication Yes Metoprolol, lisinopril for hypertension  Chemotherapy No   Other No      Type of Medication Issue Identified Description of Issue Recommendation(s)  Drug Interaction(s) (clinically significant)     Duplicate Therapy     Allergy     No Medication Administration End Date     Incorrect Dose     Additional Drug Therapy Needed     Significant med changes from prior encounter (inform family/care partners about these prior to discharge).    Other       Clinically significant medication issues were identified that warrant physician communication and completion of prescribed/recommended actions by midnight of the next day:  No  Name of provider notified for urgent issues identified: not applicable  Time spent performing this drug regimen review (minutes):   Rico Junker, RPh 04/01/2021 7:10 PM
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
## 2021-04-01 NOTE — Progress Notes (Signed)
Pt arrived to unit at approx 1430 via bed, pt is alert to person and place, pt oriented to unit.

## 2021-04-02 DIAGNOSIS — I10 Essential (primary) hypertension: Secondary | ICD-10-CM | POA: Diagnosis not present

## 2021-04-02 DIAGNOSIS — R339 Retention of urine, unspecified: Secondary | ICD-10-CM | POA: Diagnosis not present

## 2021-04-02 DIAGNOSIS — T07XXXA Unspecified multiple injuries, initial encounter: Secondary | ICD-10-CM

## 2021-04-02 DIAGNOSIS — S069X3S Unspecified intracranial injury with loss of consciousness of 1 hour to 5 hours 59 minutes, sequela: Secondary | ICD-10-CM | POA: Diagnosis not present
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
## 2021-04-02 NOTE — Plan of Care (Signed)
  Problem: RH Balance Goal: LTG Patient will maintain dynamic sitting balance (PT) Description: LTG:  Patient will maintain dynamic sitting balance with assistance during mobility activities (PT) Flowsheets (Taken 04/02/2021 1741) LTG: Pt will maintain dynamic sitting balance during mobility activities with:: Independent with assistive device  Goal: LTG Patient will maintain dynamic standing balance (PT) Description: LTG:  Patient will maintain dynamic standing balance with assistance during mobility activities (PT) Flowsheets (Taken 04/02/2021 1741) LTG: Pt will maintain dynamic standing balance during mobility activities with:: Supervision/Verbal cueing   Problem: RH Bed Mobility Goal: LTG Patient will perform bed mobility with assist (PT) Description: LTG: Patient will perform bed mobility with assistance, with/without cues (PT). Flowsheets (Taken 04/02/2021 1741) LTG: Pt will perform bed mobility with assistance level of: Supervision/Verbal cueing   Problem: RH Bed to Chair Transfers Goal: LTG Patient will perform bed/chair transfers w/assist (PT) Description: LTG: Patient will perform bed to chair transfers with assistance (PT). Flowsheets (Taken 04/02/2021 1741) LTG: Pt will perform Bed to Chair Transfers with assistance level: Supervision/Verbal cueing   Problem: RH Car Transfers Goal: LTG Patient will perform car transfers with assist (PT) Description: LTG: Patient will perform car transfers with assistance (PT). Flowsheets (Taken 04/02/2021 1741) LTG: Pt will perform car transfers with assist:: Supervision/Verbal cueing   Problem: RH Ambulation Goal: LTG Patient will ambulate in controlled environment (PT) Description: LTG: Patient will ambulate in a controlled environment, # of feet with assistance (PT). Flowsheets (Taken 04/02/2021 1741) LTG: Pt will ambulate in controlled environ  assist needed:: Supervision/Verbal cueing LTG: Ambulation distance in controlled environment:  261ft with LRAD Goal: LTG Patient will ambulate in home environment (PT) Description: LTG: Patient will ambulate in home environment, # of feet with assistance (PT). Flowsheets (Taken 04/02/2021 1741) LTG: Pt will ambulate in home environ  assist needed:: Supervision/Verbal cueing LTG: Ambulation distance in home environment: 5ft with LRAD   Problem: RH Stairs Goal: LTG Patient will ambulate up and down stairs w/assist (PT) Description: LTG: Patient will ambulate up and down # of stairs with assistance (PT) Flowsheets (Taken 04/02/2021 1741) LTG: Pt will ambulate up/down stairs assist needed:: Supervision/Verbal cueing LTG: Pt will  ambulate up and down number of stairs: 12 steps with 1 UE support
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
## 2021-04-02 NOTE — Progress Notes (Signed)
Patient is A&O x 1 -2. Patient has been impulsive, anxious and confused throughout the shift. Several attempts to remove soft restraints. Patient continues to attempt to get OOB unassisted. Patient has unsteady gait and needs to be assisted when OOB. Patient continues to pull at peg tube. Abdominal binder on as ordered, continued to redirect patient. Patient has been walked to and from the bathroom several times. Passive and active ROM provided. Patient refused lunch and ensure. Patient denies being hungry. Safety measures in place, call light within reach.
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
## 2021-04-03 ENCOUNTER — Inpatient Hospital Stay (HOSPITAL_COMMUNITY): Payer: 59

## 2021-04-03 DIAGNOSIS — S069X0S Unspecified intracranial injury without loss of consciousness, sequela: Secondary | ICD-10-CM | POA: Diagnosis not present

## 2021-04-03 DIAGNOSIS — R1312 Dysphagia, oropharyngeal phase: Secondary | ICD-10-CM | POA: Diagnosis not present

## 2021-04-03 MED ORDER — BETHANECHOL CHLORIDE 10 MG PO TABS
10.0000 mg | ORAL_TABLET | Freq: Three times a day (TID) | ORAL | Status: DC
  Administered 2021-04-03 – 2021-04-14 (×33): 10 mg via ORAL
  Filled 2021-04-03 (×33): qty 1

## 2021-04-03 NOTE — Progress Notes (Signed)
Patient wrist restraints remained off this evening. Significant other at bed side. Denies pain or discomfort at this time. Posey belt still in place for safety. Call light and personal items with in reach. Safety maintained

## 2021-04-03 NOTE — Progress Notes (Signed)
While sitting with patient this afternoon patient had increased congestion and coughing. Patient had increased wheezing in bilateral lungs with diminished lung sounds in bilateral lower lobes. Patient had emesis x1. Dr notified, new order for chest xray x 2. Order placed, waiting for radiology to obtain Xray. HOB up 45 degrees. Patient continues to have soft wrist restraints and posey belt for safety. Patient becomes anxious, agitated and confused at times puling at peg tube and IV. Continue to attempt to remove wrist restraints for least restrictive restraints. Patient has had decreased episodes of anxiety this afternoon. Wrist restraints removed at this time. Posey belt remains on for safety. Call light and personal items within reach. Safety alarm on, bed in low position, floor pads down and posey belt on for safety.
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
## 2021-04-04 DIAGNOSIS — T07XXXA Unspecified multiple injuries, initial encounter: Secondary | ICD-10-CM | POA: Diagnosis not present

## 2021-04-04 DIAGNOSIS — R339 Retention of urine, unspecified: Secondary | ICD-10-CM | POA: Diagnosis not present

## 2021-04-04 DIAGNOSIS — I10 Essential (primary) hypertension: Secondary | ICD-10-CM | POA: Diagnosis not present

## 2021-04-04 DIAGNOSIS — S069X3S Unspecified intracranial injury with loss of consciousness of 1 hour to 5 hours 59 minutes, sequela: Secondary | ICD-10-CM | POA: Diagnosis not present

## 2021-04-04 LAB — CBC WITH DIFFERENTIAL/PLATELET
Abs Immature Granulocytes: 0.11 10*3/uL — ABNORMAL HIGH (ref 0.00–0.07)
Basophils Absolute: 0.1 10*3/uL (ref 0.0–0.1)
Basophils Relative: 1 %
Eosinophils Absolute: 0.2 10*3/uL (ref 0.0–0.5)
Eosinophils Relative: 2 %
HCT: 41 % (ref 39.0–52.0)
Hemoglobin: 13.8 g/dL (ref 13.0–17.0)
Immature Granulocytes: 1 %
Lymphocytes Relative: 20 %
Lymphs Abs: 2.2 10*3/uL (ref 0.7–4.0)
MCH: 30.3 pg (ref 26.0–34.0)
MCHC: 33.7 g/dL (ref 30.0–36.0)
MCV: 89.9 fL (ref 80.0–100.0)
Monocytes Absolute: 1 10*3/uL (ref 0.1–1.0)
Monocytes Relative: 9 %
Neutro Abs: 7.3 10*3/uL (ref 1.7–7.7)
Neutrophils Relative %: 67 %
Platelets: 314 10*3/uL (ref 150–400)
RBC: 4.56 MIL/uL (ref 4.22–5.81)
RDW: 14.1 % (ref 11.5–15.5)
WBC: 10.8 10*3/uL — ABNORMAL HIGH (ref 4.0–10.5)
nRBC: 0 % (ref 0.0–0.2)

## 2021-04-04 LAB — COMPREHENSIVE METABOLIC PANEL
ALT: 86 U/L — ABNORMAL HIGH (ref 0–44)
AST: 28 U/L (ref 15–41)
Albumin: 3 g/dL — ABNORMAL LOW (ref 3.5–5.0)
Alkaline Phosphatase: 154 U/L — ABNORMAL HIGH (ref 38–126)
Anion gap: 11 (ref 5–15)
BUN: 11 mg/dL (ref 6–20)
CO2: 28 mmol/L (ref 22–32)
Calcium: 9.7 mg/dL (ref 8.9–10.3)
Chloride: 95 mmol/L — ABNORMAL LOW (ref 98–111)
Creatinine, Ser: 0.64 mg/dL (ref 0.61–1.24)
GFR, Estimated: >60 mL/min (ref >60)
Glucose, Bld: 106 mg/dL — ABNORMAL HIGH (ref 70–99)
Potassium: 3.3 mmol/L — ABNORMAL LOW (ref 3.5–5.1)
Sodium: 134 mmol/L — ABNORMAL LOW (ref 135–145)
Total Bilirubin: 0.4 mg/dL (ref 0.3–1.2)
Total Protein: 7.2 g/dL (ref 6.5–8.1)

## 2021-04-04 MED ORDER — POTASSIUM CHLORIDE CRYS ER 20 MEQ PO TBCR: 20.0000 meq | EXTENDED_RELEASE_TABLET | Freq: Every day | ORAL | Status: DC

## 2021-04-04 MED ORDER — OSMOLITE 1.5 CAL PO LIQD
1020.0000 mL | ORAL | Status: DC
  Filled 2021-04-04: qty 2000

## 2021-04-04 MED ORDER — PROSOURCE TF PO LIQD
90.0000 mL | Freq: Two times a day (BID) | ORAL | Status: DC
  Administered 2021-04-05 (×2): 90 mL
  Filled 2021-04-04 (×2): qty 90

## 2021-04-04 MED ORDER — OSMOLITE 1.5 CAL PO LIQD: 600.0000 mL | ORAL | Status: DC

## 2021-04-04 MED ORDER — POTASSIUM CHLORIDE 20 MEQ PO PACK
20.0000 meq | PACK | Freq: Every day | ORAL | Status: DC
  Administered 2021-04-04 – 2021-04-05 (×2): 20 meq
  Filled 2021-04-04 (×2): qty 1

## 2021-04-04 MED ORDER — BOOST / RESOURCE BREEZE PO LIQD CUSTOM
1.0000 | Freq: Three times a day (TID) | ORAL | Status: DC
  Administered 2021-04-04 – 2021-04-15 (×11): 1 via ORAL

## 2021-04-04 MED ORDER — LISINOPRIL 10 MG PO TABS
10.0000 mg | ORAL_TABLET | Freq: Once | ORAL | Status: AC
  Administered 2021-04-04: 10 mg via ORAL
  Filled 2021-04-04: qty 1

## 2021-04-04 MED ORDER — LISINOPRIL 20 MG PO TABS
20.0000 mg | ORAL_TABLET | Freq: Every day | ORAL | Status: DC
  Administered 2021-04-05: 20 mg via ORAL
  Filled 2021-04-04: qty 1

## 2021-04-04 NOTE — Progress Notes (Signed)
Occupational Therapy TBI Note  Patient Details  Name: Scott [name] MRN: [id number] Date of Birth: 07/20/1985  Today's Date: 04/04/2021 OT Individual Time: 0700-0800 OT Individual Time Calculation (min): 60 min    Short Term Goals: Week 1:  OT Short Term Goal 1 (Week 1): Pt will sequence bathing with MOD VC OT Short Term Goal 2 (Week 1): Pt will teminate grooming appropriately with no VC OT Short Term Goal 3 (Week 1): Pt will don shirt with MIN A OT Short Term Goal 4 (Week 1): Pt will complete 1/3 steps of donning pants  Skilled Therapeutic Interventions/Progress Updates:     Pt received in bed with no pain reported.  ADL: Pt completes ADL at overall min-mod A shower Level. Skilled interventions include: education on TBI complications-decreased temp regulation as pt is profusely sweating but reporting to be cold (no fever). VC forwaiting for OT prior to mobility d/t balance deficits. VC for weight shift forward onto toes in standing d/t posterior bias. Pt demo improvement in sequencing this date moving along with bathing body parts with MOD cuing after perseverating on washing hair. Pt completes UB/LB dressing with MOD A this date with posterior LOB. Pt would benefit from threading head first. Education on posture implications with neck stiffness (reported). Pt positioned in recliner for comfort to trial sitting OOB with belt alarm on    Pt left at end of session in recliner with exit alarm on, call light in reach and all needs met   Therapy Documentation   Pain: Pain Assessment Pain Scale: 0-10 Pain Score: 0-No pain Agitated Behavior Scale: TBI  Observation Details Observation Environment: pt room Start of observation period - Date: 04/04/21 Start of observation period - Time: 0700 End of observation period - Date: 04/04/21 End of observation period - Time: 0800 Agitated Behavior Scale (DO NOT LEAVE BLANKS) Short attention span, easy distractibility, inability to  concentrate: Present to a slight degree Impulsive, impatient, low tolerance for pain or frustration: Present to a slight degree Uncooperative, resistant to care, demanding: Absent Violent and/or threatening violence toward people or property: Absent Explosive and/or unpredictable anger: Absent Rocking, rubbing, moaning, or other self-stimulating behavior: Absent Pulling at tubes, restraints, etc.: Absent Wandering from treatment areas: Absent Restlessness, pacing, excessive movement: Absent Repetitive behaviors, motor, and/or verbal: Absent Rapid, loud, or excessive talking: Absent Sudden changes of mood: Absent Easily initiated or excessive crying and/or laughter: Absent Self-abusiveness, physical and/or verbal: Absent Agitated behavior scale total score: 16    Therapy/Group: Individual Therapy   M  04/04/2021, 12:19 PM 
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
## 2021-04-04 NOTE — Progress Notes (Signed)
Inpatient Rehabilitation Care Coordinator Assessment and Plan Patient Details  Name: Scott [name] MRN: [id number] Date of Birth: 12-05-1985  Today's Date: 04/04/2021  [name] Problems: Principal Problem:   TBI (traumatic brain injury) Scott [name])  Past Medical History: History reviewed. No pertinent past medical history. Past Surgical History:  Past Surgical History:  Procedure Laterality Date   FRACTURE SURGERY     PEG PLACEMENT N/A 03/11/2021   Procedure: PERCUTANEOUS ENDOSCOPIC GASTROSTOMY (PEG) PLACEMENT;  Surgeon: Scott Oka, MD;  Location: [facility];  Service: General;  Laterality: N/A;  Need endo   TRACHEOSTOMY TUBE PLACEMENT N/A 03/11/2021   Procedure: TRACHEOSTOMY;  Surgeon: Scott Oka, MD;  Location: [facility];  Service: General;  Laterality: N/A;   Social History:  reports that he has been smoking cigarettes. He has never used smokeless tobacco. He reports that he does not currently use alcohol. He reports that he does not currently use drugs.  Family / Support Systems Marital Status: Single Patient Roles: Partner Spouse/Significant Other: Scott [name] Children: Has children that do not live in the home; strained relationship Other Supports: None reported Anticipated Caregiver: Scott [name] Ability/Limitations of Caregiver: Scott [name] works from home Caregiver Availability: Intermittent Family Dynamics: Pt lives in the home with his partner Scott [name] and her 81 y.o. 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 No issues since Guardian/Conservator: N/A   Abuse/Neglect Abuse/Neglect Assessment Can Be Completed: Yes Physical Abuse: Denies Verbal Abuse: Denies Sexual Abuse: Denies Exploitation of patient/patient's resources: Denies Self-Neglect: Denies  Patient response to: Social Isolation - How often do you feel lonely or isolated from those around you?: Never  Emotional Status Pt's affect, behavior and adjustment status: Pt in good spirits at time of visit until he was informed of his ELOS in which he became very tearful. Pt reported he had work to get back too, and had ot make sure he did not lose his employees. Recent Psychosocial Issues: Denies Psychiatric History: Denies Substance Abuse History: Denies. per EMR, pt was positive for EtoH, benzos and THC  Patient / Family Perceptions, Expectations & Goals Pt/Family understanding of illness & functional limitations: Pt and family have a general understanding of care needs Premorbid pt/family roles/activities: Independent Anticipated changes in roles/activities/participation: Assistance with ADLs/IADls Pt/family expectations/goals: Pt unable to answer this question appropriately.  Community Resources Express Scripts: None Premorbid Home Care/DME Agencies: None Transportation available at discharge: Scott [name] Is the patient able to respond to transportation needs?: Yes In the past 12 months, has lack of transportation kept you from medical appointments or from getting medications?: No In the past 12 months, has lack of transportation kept you from meetings, work, or from getting things needed for daily living?: No Resource referrals recommended: Neuropsychology  Discharge Planning Living Arrangements: Spouse/significant other, Children Support Systems: Spouse/significant other, Children Type of Residence: Private residence Insurance Resources: Multimedia programmer (specify) ([place]) Financial Resources: Employment, Advertising account executive Screen Referred: No Living Expenses: Mortgage Money Management: Patient, Spouse Does the patient have any problems obtaining your medications?: No Home Management: Both manage homecare needs Patient/Family Preliminary Plans: Partner Scott [name] Care  Coordinator Barriers to Discharge: Decreased caregiver support Care Coordinator Anticipated Follow Up Needs: HH/OP Expected length of stay: 16-21 days  Clinical Impression SW met with pt in room to introduce self, explain role, and discuss discharge process. Pt is not a English as a second language teacher. No HCPOA. No DME. Pt called s/o Scott [name] while SW in room to inform on ELOS in which he was not happy about the date and became tearful.  She reported she would be up to see him this afternoon.  *SW met with pt s/o Scott [name] to introduce self, explain role, and discuss discharge process. Scott [name] works from home, and reports is busy. When discussing amount of support that can be provided. Pt needs to be atleast intermittent supervision. She is willing to hire private aide if needed. SW informed will provide updates after team conference tomorrow.   Scott [name] A Scott [name] 04/04/2021, 2:28 PM

## 2021-04-04 NOTE — Progress Notes (Signed)
Pt and wife refused tube feeds at this time. Report handed off to night nurse. Mylo Red, LPN

## 2021-04-04 NOTE — Progress Notes (Signed)
Physical Therapy TBI Note  Patient Details  Name: Scott [name] MRN: [id number] Date of Birth: [birth year]  Today's Date: 04/04/2021 PT Individual Time: 1300-1400 PT Individual Time Calculation (min): 60 min   Short Term Goals: Week 1:  PT Short Term Goal 1 (Week 1): Pt will perform bed mobiltiy with supervision assist PT Short Term Goal 2 (Week 1): Pt will ambulate >21ft with min assist From PT PT Short Term Goal 3 (Week 1): Pt will attend to task for >5 min without requiring redirection PT Short Term Goal 4 (Week 1): Pt will transfer to and from [facility] with min assist  Skilled Therapeutic Interventions/Progress Updates:     Patient in recliner in the room upon PT arrival. Patient alert and agreeable to PT session. Patient denied pain during session.  Patient answered the phone at beginning of session for an unknown number. Patient discussed scheduling as assessment of a job for his company. Attempted to redirect patient x2, as phone call was inappropriate at this time due to cognitive deficits. Patient defensive and pulling phone away from therapist. Following call, where patient had scheduled a follow-up call to plan an estimate for a job tomorrow, PT educated on why the call was inappropriate at this time and provided orientation to situation and deficits and educated on ELOS. Patient's SO, Scott [name], arrived during session and made aware. She plans to call the client back to inform them of the situation and discuss redirecting work calls to someone else at this time. Reports patient is protective of his phone, as this is his way of contacting her independently throughout the day. Will follow-up as needed.   Educated patient and [name] on TBI recovery, ELOS, CIR policies/procedures and schedules, PT goals and interventions. Scott [name] with appropriate questions. Patient emotionally labile and tearful during education and prior to Scott [name]'s arrival. Perseverated on wanting Scott [name] here, and wanting to go  home, stating, "I will come back every day if you just let me go home tonight." Redirected and education patient on barriers to d/c, including safety awareness, balance, strength, and increased fall risk. Patient with poor incite into deficits, stated, "I can do everything I need to."   Initiated balance assessment, however, patient unable to attend due to internal and external distraction with Scott [name] present.  Performed sit to stand without upper extremity support with supervision; static standing balance x2 min with 2 trials due to first with posterior LOB with distraction, standing with eyes closed x10 sec with supervision, standing looking over each shoulder with supervision, and 360 turn R/L with supervision and min A x1 due to LOB.  Patient donned tennis shoes with max A, placed feet in without heels and stopped task, stated, "they are new shoes." PT then completed task for patient. Patient performed sit to/from stand with supervision x5 and ambulated >50 feet x2 with CGA-min A without AD. Ambulated with ataxic gait pattern with intermittent scissoring, decreased L DF, and poor spatial awareness. Patient attributed balance deficits to his shoes. Mild improvement in deficits without tennis shoes on second trial.   Patient in recliner with Scott [name] and another family member in the room at end of session with breaks locked, seat belt alarm set, and all needs within reach.   Therapy Documentation Precautions:  Precautions Precautions: Fall Precaution Comments: waist restraint, PEG Required Braces or Orthoses: Sling Restrictions Weight Bearing Restrictions: Yes RUE Weight Bearing: Non weight bearing LUE Weight Bearing: Non weight bearing RLE Weight Bearing: Weight bearing as tolerated LLE Weight Bearing:  Weight bearing as tolerated Other Position/Activity Restrictions: gentle ROM allowed to L UE per notes 8/17 Agitated Behavior Scale: TBI Observation Details Observation Environment: [address]  [address] of observation period - Date: 04/04/21 Start of observation period - Time: 1300 End of observation period - Date: 04/04/21 End of observation period - Time: 1400 Agitated Behavior Scale (DO NOT LEAVE BLANKS) Short attention span, easy distractibility, inability to concentrate: Present to a slight degree Impulsive, impatient, low tolerance for pain or frustration: Present to a slight degree Uncooperative, resistant to care, demanding: Absent Violent and/or threatening violence toward people or property: Absent Explosive and/or unpredictable anger: Absent Rocking, rubbing, moaning, or other self-stimulating behavior: Absent Pulling at tubes, restraints, etc.: Absent Wandering from treatment areas: Absent Restlessness, pacing, excessive movement: Absent Repetitive behaviors, motor, and/or verbal: Present to a slight degree Rapid, loud, or excessive talking: Absent Sudden changes of mood: Present to a slight degree Easily initiated or excessive crying and/or laughter: Present to a slight degree Self-abusiveness, physical and/or verbal: Present to a slight degree Agitated behavior scale total score: 20   Therapy/Group: Individual Therapy  Brayton Baumgartner L Kseniya Grunden PT, DPT  04/04/2021, 4:06 PM

## 2021-04-04 NOTE — Progress Notes (Addendum)
Pt was not impulsive during the day, pt attempted to get out of wheelchair for bathroom use. Pt wife upset and requesting restraints. Addressed pt/family concerns. [place] notified. Posey belt in place, bed alarm on middle setting, floor mats in place, telesitter on and in view of patient. Bed in lowest position. Call light in reach. Wife at bedside. Pt needs met. No other concerns at this time.  Sheela Stack, LPN
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
## 2021-04-04 NOTE — Care Management (Signed)
Inpatient Rehabilitation Center Individual Statement of Services  Patient Name:  Scott [name]  Date:  04/04/2021  Welcome to the Inpatient Rehabilitation Center.  Our goal is to provide you with an individualized program based on your diagnosis and situation, designed to meet your specific needs.  With this comprehensive rehabilitation program, you will be expected to participate in at least 3 hours of rehabilitation therapies Monday-Friday, with modified therapy programming on the weekends.  Your rehabilitation program will include the following services:  Physical Therapy (PT), Occupational Therapy (OT), Speech Therapy (ST), 24 hour per day rehabilitation nursing, Therapeutic Recreaction (TR), Psychology, Neuropsychology, Care Coordinator, Rehabilitation Medicine, Nutrition Services, Pharmacy Services, and Other  Weekly team conferences will be held on Tuesdays to discuss your progress.  Your Inpatient Rehabilitation Care Coordinator will talk with you frequently to get your input and to update you on team discussions.  Team conferences with you and your family in attendance may also be held.  Expected length of stay: 16-21 days  Overall anticipated outcome: Supervision  Depending on your progress and recovery, your program may change. Your Inpatient Rehabilitation Care Coordinator will coordinate services and will keep you informed of any changes. Your Inpatient Rehabilitation Care Coordinator's name and contact numbers are listed  below.  The following services may also be recommended but are not provided by the Inpatient Rehabilitation Center:  Driving Evaluations Home Health Rehabiltiation Services Outpatient Rehabilitation Services Vocational Rehabilitation   Arrangements will be made to provide these services after discharge if needed.  Arrangements include referral to agencies that provide these services.  Your insurance has been verified to be:  [employer]  Your primary  doctor is:  No PCP  Pertinent information will be shared with your doctor and your insurance company.  Inpatient Rehabilitation Care Coordinator:  Susie Cassette [phone] or [phone]  Information discussed with and copy given to patient by: Gretchen Short, 04/04/2021, 10:56 AM

## 2021-04-04 NOTE — Progress Notes (Signed)
Inpatient Rehabilitation  Patient information reviewed and entered into eRehab system by Falicity Sheets Dontay Harm, OTR/L.   Information including medical coding, functional ability and quality indicators will be reviewed and updated through discharge.    

## 2021-04-05 DIAGNOSIS — R339 Retention of urine, unspecified: Secondary | ICD-10-CM | POA: Diagnosis not present

## 2021-04-05 DIAGNOSIS — S069X3S Unspecified intracranial injury with loss of consciousness of 1 hour to 5 hours 59 minutes, sequela: Secondary | ICD-10-CM | POA: Diagnosis not present

## 2021-04-05 DIAGNOSIS — I10 Essential (primary) hypertension: Secondary | ICD-10-CM | POA: Diagnosis not present

## 2021-04-05 DIAGNOSIS — T07XXXA Unspecified multiple injuries, initial encounter: Secondary | ICD-10-CM | POA: Diagnosis not present

## 2021-04-05 MED ORDER — POLYETHYLENE GLYCOL 3350 17 G PO PACK: 17.0000 g | PACK | Freq: Every day | ORAL | Status: DC | PRN

## 2021-04-05 MED ORDER — OSMOLITE 1.5 CAL PO LIQD: 600.0000 mL | ORAL | Status: DC

## 2021-04-05 MED ORDER — PANTOPRAZOLE 2 MG/ML SUSPENSION
40.0000 mg | Freq: Every day | ORAL | Status: DC
  Administered 2021-04-06 – 2021-04-13 (×8): 40 mg via ORAL
  Filled 2021-04-05 (×9): qty 20

## 2021-04-05 MED ORDER — POTASSIUM CHLORIDE 20 MEQ PO PACK
20.0000 meq | PACK | Freq: Every day | ORAL | Status: DC
  Administered 2021-04-06 – 2021-04-12 (×7): 20 meq via ORAL
  Filled 2021-04-05 (×8): qty 1

## 2021-04-05 MED ORDER — THIAMINE HCL 100 MG/ML IJ SOLN
100.0000 mg | Freq: Every day | INTRAMUSCULAR | Status: DC
  Filled 2021-04-05 (×10): qty 1

## 2021-04-05 MED ORDER — LISINOPRIL 20 MG PO TABS
20.0000 mg | ORAL_TABLET | Freq: Every evening | ORAL | Status: DC
  Administered 2021-04-05 – 2021-04-15 (×11): 20 mg via ORAL
  Filled 2021-04-05 (×11): qty 1

## 2021-04-05 MED ORDER — DOCUSATE SODIUM 50 MG/5ML PO LIQD: 100.0000 mg | Freq: Two times a day (BID) | ORAL | Status: DC | PRN

## 2021-04-05 MED ORDER — PROSOURCE PLUS PO LIQD: 30.0000 mL | Freq: Two times a day (BID) | ORAL | Status: DC

## 2021-04-05 MED ORDER — MEGESTROL ACETATE 400 MG/10ML PO SUSP
400.0000 mg | Freq: Every day | ORAL | Status: DC
  Administered 2021-04-05 – 2021-04-13 (×8): 400 mg via ORAL
  Filled 2021-04-05 (×9): qty 10

## 2021-04-05 MED ORDER — APIXABAN 5 MG PO TABS
5.0000 mg | ORAL_TABLET | Freq: Two times a day (BID) | ORAL | Status: DC
  Administered 2021-04-05 – 2021-04-16 (×22): 5 mg via ORAL
  Filled 2021-04-05 (×22): qty 1

## 2021-04-05 MED ORDER — NUTRISOURCE FIBER PO PACK
1.0000 | PACK | Freq: Two times a day (BID) | ORAL | Status: DC
  Administered 2021-04-05 – 2021-04-11 (×12): 1 via ORAL
  Filled 2021-04-05 (×22): qty 1

## 2021-04-05 MED ORDER — OXYCODONE HCL 5 MG/5ML PO SOLN
5.0000 mg | ORAL | Status: DC | PRN
  Administered 2021-04-06 (×2): 10 mg via ORAL
  Filled 2021-04-05 (×2): qty 10

## 2021-04-05 MED ORDER — THIAMINE HCL 100 MG PO TABS
100.0000 mg | ORAL_TABLET | Freq: Every day | ORAL | Status: DC
  Administered 2021-04-06 – 2021-04-15 (×10): 100 mg via ORAL
  Filled 2021-04-05 (×10): qty 1

## 2021-04-05 MED ORDER — FOLIC ACID 1 MG PO TABS
1.0000 mg | ORAL_TABLET | Freq: Every day | ORAL | Status: DC
  Administered 2021-04-06 – 2021-04-16 (×11): 1 mg via ORAL
  Filled 2021-04-05 (×11): qty 1

## 2021-04-05 MED ORDER — QUETIAPINE FUMARATE 50 MG PO TABS
50.0000 mg | ORAL_TABLET | Freq: Every day | ORAL | Status: DC
  Administered 2021-04-05 – 2021-04-10 (×6): 50 mg via ORAL
  Filled 2021-04-05 (×8): qty 1

## 2021-04-05 MED ORDER — ADULT MULTIVITAMIN W/MINERALS CH
1.0000 | ORAL_TABLET | Freq: Every day | ORAL | Status: DC
  Administered 2021-04-06 – 2021-04-16 (×11): 1 via ORAL
  Filled 2021-04-05 (×11): qty 1

## 2021-04-05 MED ORDER — METOPROLOL TARTRATE 50 MG PO TABS
150.0000 mg | ORAL_TABLET | Freq: Three times a day (TID) | ORAL | Status: DC
  Administered 2021-04-05 – 2021-04-16 (×33): 150 mg via ORAL
  Filled 2021-04-05 (×33): qty 3

## 2021-04-05 MED ORDER — CLONAZEPAM 0.5 MG PO TABS
0.5000 mg | ORAL_TABLET | Freq: Two times a day (BID) | ORAL | Status: DC
  Administered 2021-04-05 – 2021-04-15 (×13): 0.5 mg via ORAL
  Filled 2021-04-05 (×18): qty 1

## 2021-04-05 MED ORDER — LOPERAMIDE HCL 1 MG/7.5ML PO SUSP
2.0000 mg | ORAL | Status: DC | PRN
  Administered 2021-04-09: 2 mg via ORAL
  Filled 2021-04-05 (×2): qty 15

## 2021-04-05 MED ORDER — METHOCARBAMOL 500 MG PO TABS: 1000.0000 mg | ORAL_TABLET | Freq: Three times a day (TID) | ORAL | Status: DC | PRN

## 2021-04-05 MED ORDER — ACETAMINOPHEN 325 MG PO TABS
325.0000 mg | ORAL_TABLET | ORAL | Status: DC | PRN
  Administered 2021-04-05 – 2021-04-16 (×7): 650 mg via ORAL
  Filled 2021-04-05 (×8): qty 2

## 2021-04-05 MED ORDER — QUETIAPINE FUMARATE 50 MG PO TABS: 50.0000 mg | ORAL_TABLET | Freq: Every day | ORAL | Status: DC

## 2021-04-05 NOTE — Progress Notes (Signed)
Occupational Therapy Session Note  Patient Details  Name: Scott [name] MRN: [id number] Date of Birth: 11/10/1985  Today's Date: 04/05/2021 OT Individual Time: 0900-1000 OT Individual Time Calculation (min): 60 min    Short Term Goals: Week 1:  OT Short Term Goal 1 (Week 1): Pt will sequence bathing with MOD VC OT Short Term Goal 2 (Week 1): Pt will teminate grooming appropriately with no VC OT Short Term Goal 3 (Week 1): Pt will don shirt with MIN A OT Short Term Goal 4 (Week 1): Pt will complete 1/3 steps of donning pants  Skilled Therapeutic Interventions/Progress Updates:    Patient in bed, alert and oriented to date.  He denies pain at this time and states that he slept pretty well.  Supine to sitting edge of bed with CS.  He ate breakfast seated edge of bed with set up and occ cues for rate.  Sit to stand and ambulation without AD to/from bed, shower bench and recliner with CGA - occ mild LOB to left with cues to slow pace.   He completed undressing with CGA for balance in stance.  Brief wet with urine.  Completed shower seated on bench with CS and min cues/CGA when in stance.  Completed dressing seated on bench - OH shirt min/mod A.  Incontinence brief and pants mod A, slipper socks max / dep.  Oral care in stance at sink with CS/CGA.  He returned to recliner at close of session, asking how he was doing and when he would be ready to go home.  Reviewed safety and goals for therapy.  Behavior mildly impulsive at times but responsive to redirection.  Seat belt alarm set and telesitter in place at close of session, call bell and tray table in reach.    Therapy Documentation Precautions:  Precautions Precautions: Fall Precaution Comments: waist restraint, PEG Required Braces or Orthoses: Sling Restrictions Weight Bearing Restrictions: Yes RUE Weight Bearing: Non weight bearing LUE Weight Bearing: Non weight bearing RLE Weight Bearing: Weight bearing as tolerated LLE Weight Bearing:  Weight bearing as tolerated Other Position/Activity Restrictions: gentle ROM allowed to L UE per notes 8/17  Therapy/Group: Individual Therapy  Barrie Lyme 04/05/2021, 7:26 AM
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
## 2021-04-05 NOTE — Progress Notes (Signed)
Patient refusing tube feeds, Osmolite 1.5.  Patient educated.

## 2021-04-05 NOTE — Progress Notes (Signed)
Physical Therapy TBI Note  Patient Details  Name: Scott [name] MRN: [id number] Date of Birth: 1986/02/06  Today's Date: 04/05/2021 PT Individual Time: 1545-1700 PT Individual Time Calculation (min): 75 min   Short Term Goals: Week 1:  PT Short Term Goal 1 (Week 1): Pt will perform bed mobiltiy with supervision assist PT Short Term Goal 2 (Week 1): Pt will ambulate >216ft with min assist From PT PT Short Term Goal 3 (Week 1): Pt will attend to task for >5 min without requiring redirection PT Short Term Goal 4 (Week 1): Pt will transfer to and from [facility] with min assist  Skilled Therapeutic Interventions/Progress Updates:    PT received sidelying in bed, agreeable to PT session. No complaints of pain. Supine to sit with Supervision, cues for performing transfer as independently as possible as pt initially requests therapist help him to sit up but he requires no physical assist. Sit to stand with close Supervision and no AD throughout session. Stand pivot transfer with CGA for balance due to ataxia. Ambulation x 120 ft with no AD and min A for balance, ataxic gait and path deviation. Ascend/descend 12 x 6" stairs with one handrail and min A for balance, cues for decreased speed and increased safety. Ambulation through obstacle course weaving through cones and stepping up/down 4" step with min A for balance. Sidesteps L/R 2 x 10 ft with no AD and min A, progression to sidesteps with cone taps, min A needed for balance and increased time needed to prevent LOB. TUG = 11.3 sec. Reviewed score and functional implications. DGI, pt scores 13. Reviewed score and functional implications for fall risk. Pt continues to exhibit most impaired balance with turns and poor awareness of performance and near LOB when turning. Standing balance playing penguin game on Wii balance board performing lateral weight shifts following visual cues from game and tactile and verbal cues from therapist, pt scores 39 points. Standing  balance playing Wii bowling game with therapist, pt progresses from CGA to close Supervision while playing game. Pt returns to bed at end of session at Supervision level. Pt left in R sidelying in bed with needs in reach, posey belt in place, bed alarm in place, telesitter present.  Therapy Documentation Precautions:  Precautions Precautions: Fall Precaution Comments: waist restraint, PEG Required Braces or Orthoses: Sling Restrictions Weight Bearing Restrictions: Yes RUE Weight Bearing: Non weight bearing LUE Weight Bearing: Non weight bearing RLE Weight Bearing: Weight bearing as tolerated LLE Weight Bearing: Weight bearing as tolerated Other Position/Activity Restrictions: gentle ROM allowed to L UE per notes 8/17 Agitated Behavior Scale: TBI Observation Details Observation Environment: pt room, therapy gym Start of observation period - Date: 04/05/21 Start of observation period - Time: 1545 End of observation period - Date: 04/05/21 End of observation period - Time: 1700 Agitated Behavior Scale (DO NOT LEAVE BLANKS) Short attention span, easy distractibility, inability to concentrate: Present to a slight degree Impulsive, impatient, low tolerance for pain or frustration: Present to a slight degree Uncooperative, resistant to care, demanding: Absent Violent and/or threatening violence toward people or property: Absent Explosive and/or unpredictable anger: Absent Rocking, rubbing, moaning, or other self-stimulating behavior: Absent Pulling at tubes, restraints, etc.: Absent Wandering from treatment areas: Absent Restlessness, pacing, excessive movement: Absent Repetitive behaviors, motor, and/or verbal: Absent Rapid, loud, or excessive talking: Absent Sudden changes of mood: Absent Easily initiated or excessive crying and/or laughter: Absent Self-abusiveness, physical and/or verbal: Absent Agitated behavior scale total score: 16 Balance: Standardized Balance  Assessment Standardized Balance Assessment: Timed Up and Go Test;Dynamic Gait Index Dynamic Gait Index Level Surface: Mild Impairment Change in Gait Speed: Mild Impairment Gait with Horizontal Head Turns: Moderate Impairment Gait with Vertical Head Turns: Mild Impairment Gait and Pivot Turn: Moderate Impairment Step Over Obstacle: Mild Impairment Step Around Obstacles: Mild Impairment Steps: Moderate Impairment Total Score: 13 Timed Up and Go Test TUG: Normal TUG Normal TUG (seconds): 11.3    Therapy/Group: Individual Therapy   Peter Congo, PT, DPT, CSRS  04/05/2021, 5:04 PM
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
## 2021-04-05 NOTE — Progress Notes (Signed)
Patient ID: Scott [name], male   DOB: 1986-02-23, 35 y.o.   MRN: [id number]  SW met with pt in room to inform on d/c date 10/12. SW to followup with his s/o April.  Loralee Pacas, MSW, [place] Office: [phone] Cell: [phone] Fax: [phone]
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
## 2021-04-06 DIAGNOSIS — R339 Retention of urine, unspecified: Secondary | ICD-10-CM | POA: Diagnosis not present

## 2021-04-06 DIAGNOSIS — S069X3S Unspecified intracranial injury with loss of consciousness of 1 hour to 5 hours 59 minutes, sequela: Secondary | ICD-10-CM | POA: Diagnosis not present

## 2021-04-06 DIAGNOSIS — T07XXXA Unspecified multiple injuries, initial encounter: Secondary | ICD-10-CM | POA: Diagnosis not present

## 2021-04-06 DIAGNOSIS — I10 Essential (primary) hypertension: Secondary | ICD-10-CM | POA: Diagnosis not present
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
## 2021-04-06 NOTE — Progress Notes (Signed)
Occupational Therapy TBI Note  Patient Details  Name: Scott [name] MRN: [id number] Date of Birth: 20-Mar-1986  Today's Date: 04/06/2021 OT Individual Time: 0900-1015 OT Individual Time Calculation (min): 75 min    Short Term Goals: Week 1:  OT Short Term Goal 1 (Week 1): Pt will sequence bathing with MOD VC OT Short Term Goal 2 (Week 1): Pt will teminate grooming appropriately with no VC OT Short Term Goal 3 (Week 1): Pt will don shirt with MIN A OT Short Term Goal 4 (Week 1): Pt will complete 1/3 steps of donning pants  Skilled Therapeutic Interventions/Progress Updates:     Pt received in bed with unrated stoma pain. RN aware and to deliver medication out of 10 pain in B feet and stoma. RN provided for pain relief  ADL: Pt completes ADL at overall CGA mobility with no AD Level and min-mod functional ADL performance at shower level. Skilled interventions include: VC for waiting for OT to start mobility d/t impulsivity, increased challenge navigating bathroom with pt opening door-no LOB negotiating ramp threshold into bathroom, fading cuing required for sequencing bathing ( mod cuing level- to apply soap to washcloth & sequencing bathing body parts). Pt with poor safety awareness attempting to doff clothing standing requiring direct VC for seated LB clothing donning/doffing. Pt requires MAX A For donning B socks and threading BLE. Pt requires min A for advancing pants past hips. During grooming pt requires either 1UE or hips pressed onto sink for sstabilization d/t poor alignement skill. MAX A for donning paper shirt head forst then BUE. Pt immediately requesting help during first sign of difficulty.  RN delivering medications and pt engaged in conversation attempting to build awareness to deficits: balance, stability, weakness. Pt unable to identify deficits despite balance deficits. Pt stating "before I came in here my balance was already 'eh.'"   Pt girlfriend arrives with new  clothing. Pt agreeable to change into those. Pt requires MAX cuing and encouragement to continue and  persist through challenge and sequence through donning tshirt, sweatshirt and pants with MIN A at sit to stand level with A to tie tight as pants are baggy.  Pt left at end of session in bed with exit alarm on, call light in reach and all needs met   Therapy Documentation Precautions:  Precautions Precautions: Fall Precaution Comments: waist restraint, PEG Required Braces or Orthoses: Sling Restrictions Weight Bearing Restrictions: Yes RUE Weight Bearing: Non weight bearing LUE Weight Bearing: Non weight bearing RLE Weight Bearing: Weight bearing as tolerated LLE Weight Bearing: Weight bearing as tolerated Other Position/Activity Restrictions: gentle ROM allowed to L UE per notes 8/17  Agitated Behavior Scale: TBI  Observation Details Observation Environment: pt room Start of observation period - Date: 04/06/21 Start of observation period - Time: 0900 End of observation period - Date: 04/06/21 End of observation period - Time: 1015 Agitated Behavior Scale (DO NOT LEAVE BLANKS) Short attention span, easy distractibility, inability to concentrate: Present to a slight degree Impulsive, impatient, low tolerance for pain or frustration: Present to a moderate degree Uncooperative, resistant to care, demanding: Present to a slight degree Violent and/or threatening violence toward people or property: Absent Explosive and/or unpredictable anger: Absent Rocking, rubbing, moaning, or other self-stimulating behavior: Absent Pulling at tubes, restraints, etc.: Absent Wandering from treatment areas: Absent Restlessness, pacing, excessive movement: Absent Repetitive behaviors, motor, and/or verbal: Absent Rapid, loud, or excessive talking: Absent Sudden changes of mood: Absent Easily initiated or excessive crying and/or laughter: Absent Self-abusiveness,  physical and/or verbal:  Absent Agitated behavior scale total score: 18  Therapy/Group: Individual Therapy  Tonny Branch 04/06/2021, 10:56 AM

## 2021-04-06 NOTE — Plan of Care (Addendum)
Behavioral Plan   Rancho Level: Rancho 5-6  Behavior to decrease/ eliminate:  Impulsivity with mobility, intermittent confusion, poor temp regulation, emotional lability, laying in the bed during the day  Changes to environment:  Lights on/blinds open during day, off/shut at night No TV at night Sweatshirt or blanket within reach if sitting OOB Offer fan if hot TIS w/c in the room to upright sitting during meals X-box only played for 1 hour after dinner, put controller away after allotted time to reduce over stimulation  Interventions: Belt alarm when in recliner Sleep wake chart Ask for blanket from home that is thicker than hospital blankets telesitter  Recommendations for interactions with patient: Reorient patient as needed using external aides Redirect to favorite topics as needed- tree business, sky diving, plants, playing piano/guitar girlfriend-April Pt does well with joking when doing something challenging Offer pt to call girlfriend if emotionally labile Be aware of phone calls pt should NOT be answering phone calls & scheduling work to be done.   Attendees: Ronney Lion ST Otis Burress G PT Jerrol Banana OT
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
## 2021-04-06 NOTE — Progress Notes (Addendum)
Physical Therapy TBI Note  Patient Details  Name: Scott [name] MRN: [id number] Date of Birth: 1986/06/26  Today's Date: 04/06/2021 PT Individual Time: 1115-1200 and 9798-9211 PT Individual Time Calculation (min): 45 min and 30 min  Short Term Goals: Week 1:  PT Short Term Goal 1 (Week 1): Pt will perform bed mobiltiy with supervision assist PT Short Term Goal 2 (Week 1): Pt will ambulate >262ft with min assist From PT PT Short Term Goal 3 (Week 1): Pt will attend to task for >5 min without requiring redirection PT Short Term Goal 4 (Week 1): Pt will transfer to and from [facility] with min assist  Skilled Therapeutic Interventions/Progress Updates:     Session 1: Patient in recliner with [name] in the room upon PT arrival. Patient alert and agreeable to PT session. Patient reported 4/10 cervical pain during session, RN made aware. PT provided repositioning, rest breaks, and distraction as pain interventions throughout session.   Patient with restricted ROM an significant muscle tightness. Educated on interventions to address pain and muscle tightness such as soft tissue mobilization, trigger point release, neuromuscular re-education and stretching, and positioning. Patient in agreement with adding this to his POC for PT, however, declined at this time.  Therapeutic Activity: Bed Mobility: Patient performed sit to supine with supervision, lying diagonal and low in the bed and refused to change position despite encouragement.  Transfers: Patient performed sit to/from stand with supervision throughout session. Provided cues for management of impulsivity x2. Performed ambulatory transfer to/from bathroom with supervision, was continent of urine in standing and attempted hand hygiene, however, required max cues for turning the water on fully and placing both hands under the water, then putting soap on his hands to wash, versus barely turning on the water and wetting his R finger tips  initially. Donned shoes with min A for tying R shoe due to PEG tube irritation with bending. Patient needed significant encouragement to perform task without assist, as patient kept saying, "you do it." Also, needed cues for incorporating L hand use during task.  Gait Training:  Patient ambulated >200 feet x2 without an AD with CGA. Ambulated with mild ataxia, decreased gait speed, decreased DF eccentric control at initial contact, lacking gastroc activation for forward propulsion, and forward and down visual gaze without head turn or visual scanning with cues for head turns, improved with cues to visual targets on R/L side of the wall, patient able to read signs indicated by therapist while continuing to walk.   Patient in bed with SO in the room at end of session with breaks locked, bed alarm set, Telesitter in place, and all needs within reach.   Session 2: Patient in bed asleep upon PT arrival. Patient aroused to verbal stimulation and agreeable to PT session. Patient saturated from sweat while lying under 4 blankets with complaints of being cold. PT offered to focus on functional mobility and balance while patient changed to dry clothing and got cleaned up, patient in agreement.  Vitals WFL.  Patient reported 3-4/10 neck and stoma site pain during session, RN made aware. PT provided repositioning, rest breaks, and distraction as pain interventions throughout session.   Therapeutic Activity: Bed Mobility: Patient performed supine to/from sit independently in a flat bed without use of bed rail. Patient initially asked for a hand to pull up on to sit up and required encouragement to sit up on his own. Transfers: Patient performed sit to/from stand x5 with supervision for safety and standing balance  while dressing with supervision. Provided patient with clean clothing and patient doffed/donned a sweat shirt, t-shirt, pants, and socks. Doffed clothing with mod A due to difficulty from saturated skin  and donned clothing with set-up assist, except min A to pull down his sweatshirt in the back due to limited upper extremity ROM and min A to place socks over his toes before pulling them up due to multiple failed attempts secondary to limited hip ROM.  Gait Training:  Patient ambulated >400 feet with 20 feet backwards without an AD with CGA. Ambulated as above with improved head movement with cues for visual scanning and target finding.   Patient in bed at end of session with breaks locked, bed alarm set, Telesitter in place,and all needs within reach.    Therapy Documentation Precautions: Precautions Precautions: Fall Precaution Comments: waist restraint, PEG Required Braces or Orthoses: Sling Restrictions Weight Bearing Restrictions: Yes RUE Weight Bearing: Non weight bearing LUE Weight Bearing: Non weight bearing RLE Weight Bearing: Weight bearing as tolerated LLE Weight Bearing: Weight bearing as tolerated Other Position/Activity Restrictions: gentle ROM allowed to L UE per notes 8/17  Agitated Behavior Scale: TBI  Observation Details Observation Environment: pt room Start of observation period - Date: 04/06/21 Start of observation period - Time: 1115 End of observation period - Date: 04/06/21 End of observation period - Time: 1200 Agitated Behavior Scale (DO NOT LEAVE BLANKS) Short attention span, easy distractibility, inability to concentrate: Present to a slight degree Impulsive, impatient, low tolerance for pain or frustration: Present to a moderate degree Uncooperative, resistant to care, demanding: Present to a slight degree Violent and/or threatening violence toward people or property: Absent Explosive and/or unpredictable anger: Absent Rocking, rubbing, moaning, or other self-stimulating behavior: Absent Pulling at tubes, restraints, etc.: Absent Wandering from treatment areas: Absent Restlessness, pacing, excessive movement: Absent Repetitive behaviors, motor,  and/or verbal: Absent Rapid, loud, or excessive talking: Absent Sudden changes of mood: Absent Easily initiated or excessive crying and/or laughter: Absent Self-abusiveness, physical and/or verbal: Absent Agitated behavior scale total score: 18  Agitated Behavior Scale: TBI  Observation Details Observation Environment: pt room Start of observation period - Date: 04/06/21 Start of observation period - Time: 1305 End of observation period - Date: 04/06/21 End of observation period - Time: 1335 Agitated Behavior Scale (DO NOT LEAVE BLANKS) Short attention span, easy distractibility, inability to concentrate: Present to a slight degree Impulsive, impatient, low tolerance for pain or frustration: Present to a slight degree Uncooperative, resistant to care, demanding: Absent Violent and/or threatening violence toward people or property: Absent Explosive and/or unpredictable anger: Absent Rocking, rubbing, moaning, or other self-stimulating behavior: Absent Pulling at tubes, restraints, etc.: Absent Wandering from treatment areas: Absent Restlessness, pacing, excessive movement: Absent Repetitive behaviors, motor, and/or verbal: Absent Rapid, loud, or excessive talking: Absent Sudden changes of mood: Absent Easily initiated or excessive crying and/or laughter: Absent Self-abusiveness, physical and/or verbal: Absent Agitated behavior scale total score: 16  Therapy/Group: Individual Therapy  Katlin Bortner L Yarelli Decelles PT, DPT  04/06/2021, 12:34 PM
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
## 2021-04-07 DIAGNOSIS — I10 Essential (primary) hypertension: Secondary | ICD-10-CM | POA: Diagnosis not present

## 2021-04-07 DIAGNOSIS — R339 Retention of urine, unspecified: Secondary | ICD-10-CM | POA: Diagnosis not present

## 2021-04-07 DIAGNOSIS — T07XXXA Unspecified multiple injuries, initial encounter: Secondary | ICD-10-CM | POA: Diagnosis not present

## 2021-04-07 DIAGNOSIS — S069X3S Unspecified intracranial injury with loss of consciousness of 1 hour to 5 hours 59 minutes, sequela: Secondary | ICD-10-CM | POA: Diagnosis not present

## 2021-04-07 LAB — BASIC METABOLIC PANEL
Anion gap: 10 (ref 5–15)
BUN: 5 mg/dL — ABNORMAL LOW (ref 6–20)
CO2: 26 mmol/L (ref 22–32)
Calcium: 9.8 mg/dL (ref 8.9–10.3)
Chloride: 96 mmol/L — ABNORMAL LOW (ref 98–111)
Creatinine, Ser: 0.61 mg/dL (ref 0.61–1.24)
GFR, Estimated: >60 mL/min (ref >60)
Glucose, Bld: 110 mg/dL — ABNORMAL HIGH (ref 70–99)
Potassium: 3.5 mmol/L (ref 3.5–5.1)
Sodium: 132 mmol/L — ABNORMAL LOW (ref 135–145)

## 2021-04-07 MED ORDER — PROSOURCE PLUS PO LIQD
30.0000 mL | Freq: Three times a day (TID) | ORAL | Status: DC
  Administered 2021-04-07 – 2021-04-15 (×15): 30 mL via ORAL
  Filled 2021-04-07 (×19): qty 30

## 2021-04-07 NOTE — Progress Notes (Signed)
Physical Therapy TBI Note  Patient Details  Name: Scott [name] MRN: [id number] Date of Birth: 1986/02/03  Today's Date: 04/07/2021 PT Individual Time: 3329-5188 PT Individual Time Calculation (min): 40 min   Short Term Goals: Week 1:  PT Short Term Goal 1 (Week 1): Pt will perform bed mobiltiy with supervision assist PT Short Term Goal 2 (Week 1): Pt will ambulate >241ft with min assist From PT PT Short Term Goal 3 (Week 1): Pt will attend to task for >5 min without requiring redirection PT Short Term Goal 4 (Week 1): Pt will transfer to and from [facility] with min assist  Skilled Therapeutic Interventions/Progress Updates:     Patient in bed asleep upon PT arrival. Patient easily aroused and agreeable to PT session. Patient reported 3-4/10 neck pain during session, RN made aware. PT provided repositioning, rest breaks, and distraction as pain interventions throughout session.   Patient reported that he has his X-Box in his room and would like to be able to play it to pass the time. PT educated on risk of over stimulation following TBI, however, benefits of scheduled time with this activity for cognitive and motor skills. Set schedule for 1 hour of video games on the X-Box following dinner. RN and team made aware and safety plan updated. Will monitor for over stimulation and modify schedule as appropriate.   SLP informed PT that the patient is not sitting up for eating or ST sessions limiting progress and participation with swallowing. Discussed with PT, reports he feels tired when he sits up and that he has increased cervical pain. Retrieved TIS w/c with head support for improved up-right sitting and OOB tolerance. Adjusted w/c to fit patient and promote increased sitting tolerance and participation in activities and therapies OOB.  Focused remainder of session on gait training for improved balance, independence, and endurance with functional gait and stairs to simulate home mobility    Therapeutic Activity: Bed Mobility: Patient performed sit to supine with supervision. Transfers: Patient performed sit to/from stand with supervision throughout session.  Gait Training:  Patient ambulated >150 feet x2 without an AD with close supervision. Ambulated with mild ataxia, decreased gait speed, decreased DF eccentric control at initial contact, lacking gastroc activation for forward propulsion, and forward and down visual gaze without head turn or visual scanning. Provided multimodal cues for increased gait speed and arm swing for improved balance, and looking ahead and scanning environment for safety. Patient ascended/descended 16x6" steps using 1 rail with CGA. Performed reciprocal gait pattern. Provided min cues for technique and sequencing. Patient with increased fatigue following stair training, required increased rest break. HR 97, SPO2 98%.  Patient in bed at end of session with breaks locked, bed alarm set, and all needs within reach. Encouraged patient to sit in TIS w/c at end of session, patient sat <5 min before attempting to remove the seat belt alarm to go back to bed. Patient returned to bed with supervision, as above, and agreeable to sitting OOB at lunch.   Therapy Documentation Precautions:  Precautions Precautions: Fall Precaution Comments: waist restraint, PEG Required Braces or Orthoses: Sling Restrictions Weight Bearing Restrictions: No RUE Weight Bearing: Weight bearing as tolerated LUE Weight Bearing: Weight bearing as tolerated RLE Weight Bearing: Weight bearing as tolerated LLE Weight Bearing: Weight bearing as tolerated Other Position/Activity Restrictions: gentle ROM allowed to L UE per notes 8/17  Agitated Behavior Scale: TBI Observation Details Observation Environment: patient room Start of observation period - Date: 04/07/21 Start of observation  period - Time: 1035 End of observation period - Date: 04/07/21 End of observation period - Time:  1115 Agitated Behavior Scale (DO NOT LEAVE BLANKS) Short attention span, easy distractibility, inability to concentrate: Present to a slight degree Impulsive, impatient, low tolerance for pain or frustration: Present to a slight degree Uncooperative, resistant to care, demanding: Present to a slight degree Violent and/or threatening violence toward people or property: Absent Explosive and/or unpredictable anger: Absent Rocking, rubbing, moaning, or other self-stimulating behavior: Absent Pulling at tubes, restraints, etc.: Absent Wandering from treatment areas: Absent Restlessness, pacing, excessive movement: Absent Repetitive behaviors, motor, and/or verbal: Absent Rapid, loud, or excessive talking: Absent Sudden changes of mood: Absent Easily initiated or excessive crying and/or laughter: Absent Self-abusiveness, physical and/or verbal: Absent Agitated behavior scale total score: 17    Therapy/Group: Individual Therapy PT, DPT  Piya Mesch L Eymi Lipuma PT, DPT  04/07/2021, 11:55 AM
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
## 2021-04-08 DIAGNOSIS — M25512 Pain in left shoulder: Secondary | ICD-10-CM

## 2021-04-08 DIAGNOSIS — R000 Tachycardia, unspecified: Secondary | ICD-10-CM | POA: Diagnosis not present

## 2021-04-08 DIAGNOSIS — I10 Essential (primary) hypertension: Secondary | ICD-10-CM | POA: Diagnosis not present

## 2021-04-08 DIAGNOSIS — S069X0D Unspecified intracranial injury without loss of consciousness, subsequent encounter: Secondary | ICD-10-CM

## 2021-04-08 DIAGNOSIS — R41844 Frontal lobe and executive function deficit: Secondary | ICD-10-CM | POA: Diagnosis not present

## 2021-04-08 DIAGNOSIS — S069X3S Unspecified intracranial injury with loss of consciousness of 1 hour to 5 hours 59 minutes, sequela: Secondary | ICD-10-CM | POA: Diagnosis not present

## 2021-04-08 NOTE — Progress Notes (Signed)
Occupational Therapy TBI Note  Patient Details  Name: Scott [name] MRN: [id number] Date of Birth: [birth year]  Today's Date: 04/08/2021 OT Individual Time: 7262-0355 OT Individual Time Calculation (min): 57 min    Short Term Goals: Week 1:  OT Short Term Goal 1 (Week 1): Pt will sequence bathing with MOD VC OT Short Term Goal 2 (Week 1): Pt will teminate grooming appropriately with no VC OT Short Term Goal 3 (Week 1): Pt will don shirt with MIN A OT Short Term Goal 4 (Week 1): Pt will complete 1/3 steps of donning pants Week 2:     Skilled Therapeutic Interventions/Progress Updates:    Pt received slouched down in bed with 8 out of 10 pain in cervical area. Hot pack provided for pain relief. Skin checks pre & post application normal. Pt agreed to try HEP plan provided.   ADL: Skilled interventions include: Pt agreeable to OT session focusing on self-care activities including showering. Pt insistent on going home tomorrow and said the doctor told him he could. OT facilitated redirection with reminders that he needs to listen to education and strategies provided by therapists d/t current level of functioning/deficits. OTS attempted to lower bed to level setting since pt will not be going home with hospital bed. Pt slightly argumentative and insistent that he will just buy a bed. Pt immediately started to put the bed with HOB elevated to rely on handrails to come supine > EOB. Impulsive sit>stand, not waiting for direction. BP 88/68 seated. Pt completes shower with close (S). Sat on TTB with neck in flexion and rounded shoulders. Neglected use of L UE and incorporated L hand only slightly when bathing. Able to sequence tasks. Minor LOB when pt stood to wash bottom - pt unaware and did not acknowledge. Pt able to ask for washcloth, and requested towel to stop water from leaking, demonstrating slightly more safety awareness than previous shower. Completed shower appropriately with no noted  perseveration. C/o neck pain throughout tx. Skilled education provided on proper sleep positioning and working on stretches provided in HEP. TED hose put on before pt returned to bed. Pt flopped back on bed, saying his neck hurt. Pt left at end of session in bed with exit alarm on, call light in reach and all needs met.    Access Code: [email or web address] URL: [email or web address] Date: 04/08/2021 Prepared by: Mariane Masters  Exercises Supine Chin Tuck - 1 x daily - 7 x weekly - 3 sets - 10 reps Supine Cervical Rotation AROM on Pillow - 1 x daily - 7 x weekly - 3 sets - 10 reps Seated Scapular Retraction - 1 x daily - 7 x weekly - 3 sets - 10 reps Seated Cervical Retraction - 1 x daily - 7 x weekly - 3 sets - 10 reps Seated Cervical Sidebending Stretch - 1 x daily - 7 x weekly - 3 sets - 10 reps Seated Levator Scapulae Stretch - 1 x daily - 7 x weekly - 3 sets - 10 reps  Therapy Documentation Precautions:  Precautions Precautions: Fall Precaution Comments: waist restraint, PEG Required Braces or Orthoses: Sling Restrictions Weight Bearing Restrictions: No RUE Weight Bearing: Weight bearing as tolerated LUE Weight Bearing: Non weight bearing RLE Weight Bearing: Weight bearing as tolerated LLE Weight Bearing: Weight bearing as tolerated Other Position/Activity Restrictions: gentle ROM allowed to L UE per notes 8/17    Agitated Behavior Scale: TBI Observation Details Observation Environment: pt room Start of observation period - Date: 04/08/21 Start  of observation period - Time: 0900 End of observation period - Date: 04/08/21 End of observation period - Time: 1000 Agitated Behavior Scale (DO NOT LEAVE BLANKS) Short attention span, easy distractibility, inability to concentrate: Present to a slight degree Impulsive, impatient, low tolerance for pain or frustration: Present to a slight degree Uncooperative, resistant to care, demanding: Present to a slight  degree Violent and/or threatening violence toward people or property: Absent Explosive and/or unpredictable anger: Absent Rocking, rubbing, moaning, or other self-stimulating behavior: Absent Pulling at tubes, restraints, etc.: Absent Wandering from treatment areas: Absent Restlessness, pacing, excessive movement: Absent Repetitive behaviors, motor, and/or verbal: Absent Rapid, loud, or excessive talking: Absent Sudden changes of mood: Absent Easily initiated or excessive crying and/or laughter: Absent Self-abusiveness, physical and/or verbal: Absent Agitated behavior scale total score: 17    Therapy/Group: Individual Therapy  Jurnie Garritano 04/08/2021, 6:48 AM

## 2021-04-08 NOTE — Progress Notes (Signed)
Physical Therapy Session Note  Patient Details  Name: Scott [name] MRN: [id number] Date of Birth: [birth year]  Today's Date: 04/08/2021 PT Individual Time: 1500-1525 PT Individual Time Calculation (min): 25 min   Short Term Goals: Week 1:  PT Short Term Goal 1 (Week 1): Pt will perform bed mobiltiy with supervision assist PT Short Term Goal 2 (Week 1): Pt will ambulate >253f with min assist From PT PT Short Term Goal 3 (Week 1): Pt will attend to task for >5 min without requiring redirection PT Short Term Goal 4 (Week 1): [facility]   Skilled Therapeutic Interventions/Progress Updates:   Pt received supine in bed and agreeable to PT. Supine>sit transfer with min assist at trunk. [facility] [place] [facility] engaged in sustained attention/balance training while engaged in Wii bowling. Performed 1 game in standing and 1 in sitting. Pt also engaged in Wii baseball x 3 innings with supervision assist and cues for attention to task and problem solving to master baseball controls. No LOB noted in standing with supervision assist for safety throughout.  Pt returned to room and performed ambulatory transfer to bed with supervision assist for safety. Sit>supine completed without assist, and left supine in bed with call bell in reach and all needs met.        Therapy Documentation Precautions:  Precautions Precautions: Fall Precaution Comments: waist restraint, PEG Required Braces or Orthoses: Sling Restrictions Weight Bearing Restrictions: No RUE Weight Bearing: Weight bearing as tolerated LUE Weight Bearing: Non weight bearing RLE Weight Bearing: Weight bearing as tolerated LLE Weight Bearing: Weight bearing as tolerated Other Position/Activity Restrictions: gentle ROM allowed to L UE per notes 8/17  Vital Signs: Therapy Vitals Temp: 98.5 F (36.9 C) Pulse Rate: 92 Resp:  16 BP: 117/69 Patient Position (if appropriate): Lying Oxygen Therapy SpO2: 97 % O2 Device: Room Air Pain:   No complaints throughout session      Therapy/Group: Individual Therapy  ALorie Phenix9/30/2022, 5:03 PM
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
## 2021-04-08 NOTE — Progress Notes (Signed)
Physical Therapy TBI Note  Patient Details  Name: Scott [name] MRN: [id number] Date of Birth: 1985/07/16  Today's Date: 04/08/2021 PT Individual Time: 1500-1525 PT Individual Time Calculation (min): 25 min   Short Term Goals: Week 1:  PT Short Term Goal 1 (Week 1): Pt will perform bed mobiltiy with supervision assist PT Short Term Goal 2 (Week 1): Pt will ambulate >270ft with min assist From PT PT Short Term Goal 3 (Week 1): Pt will attend to task for >5 min without requiring redirection PT Short Term Goal 4 (Week 1): Pt will transfer to and from [facility] with min assist  Skilled Therapeutic Interventions/Progress Updates:    Patient received in bathroom with RN present. He reports "a lot" of neck pain, but did not rate- premedicated. PT providing repositioning, distractions, rest and trigger point release to assist with pain management. RN performing bladder scan. PT educating patient on muscle guarding, proper prosturing and trigger point release to assist with managing neck pain. Trigger points found in B upper trapezius and superior paraspinals and suboccipitals. Patient unable to maintain upright and proper posture for longer periods of time due to this pain. In supine, PT reviewed cervical exercises that were provided by OT. Patient able to complete a few repetitions of each exercise before requesting to move on. Patient remaining in bed at end of session, bed alarm on, call light within reach.   Therapy Documentation Precautions:  Precautions Precautions: Fall Precaution Comments: waist restraint, PEG Required Braces or Orthoses: Sling Restrictions Weight Bearing Restrictions: No RUE Weight Bearing: Weight bearing as tolerated LUE Weight Bearing: Non weight bearing RLE Weight Bearing: Weight bearing as tolerated LLE Weight Bearing: Weight bearing as tolerated Other Position/Activity Restrictions: gentle ROM allowed to L UE per notes 8/17  Agitated Behavior  Scale: TBI Observation Details Observation Environment: patient room Start of observation period - Date: 04/08/21 Start of observation period - Time: 1500 End of observation period - Date: 04/08/21 End of observation period - Time: 1530 Agitated Behavior Scale (DO NOT LEAVE BLANKS) Short attention span, easy distractibility, inability to concentrate: Present to a slight degree Impulsive, impatient, low tolerance for pain or frustration: Present to a slight degree Uncooperative, resistant to care, demanding: Absent Violent and/or threatening violence toward people or property: Absent Explosive and/or unpredictable anger: Absent Rocking, rubbing, moaning, or other self-stimulating behavior: Absent Pulling at tubes, restraints, etc.: Absent Wandering from treatment areas: Absent Restlessness, pacing, excessive movement: Absent Repetitive behaviors, motor, and/or verbal: Absent Rapid, loud, or excessive talking: Absent Sudden changes of mood: Absent Easily initiated or excessive crying and/or laughter: Absent Self-abusiveness, physical and/or verbal: Absent Agitated behavior scale total score: 16      Therapy/Group: Individual Therapy  Elizebeth Koller, PT, DPT, CBIS  04/08/2021, 3:33 PM

## 2021-04-09 NOTE — Progress Notes (Signed)
Occupational Therapy Weekly Progress Note  Patient Details  Name: Scott [name] MRN: [id number] Date of Birth: 1985/12/15  Beginning of progress report period: April 01, 2021 End of progress report period: April 09, 2021   Patient has met 4 of 4 short term goals.  Pt is making good progress towards OT goals, however recently patient is becoming more frustrated with rules as he is unaware and denies any deficits from TBI. Pt has improved from min-mod A with transfers and mod-max A with ADLs to CGA-supervision level for balance difficulties. Pt has also developed increased neck pain and been educated and given interventions to assist with this pain, however pt continues to lack the intrinsic motivation to participate in these interventions. Reinforcement is still required.   Patient continues to demonstrate the following deficits: muscle weakness, decreased cardiorespiratoy endurance, impaired timing and sequencing and decreased coordination, decreased visual perceptual skills, decreased attention to left, decreased awareness, decreased problem solving, decreased safety awareness, and decreased memory, and decreased standing balance, decreased postural control, and decreased balance strategies and therefore will continue to benefit from skilled OT intervention to enhance overall performance with BADL, iADL, and Vocation.  Patient progressing toward long term goals..  Continue plan of care.  OT Short Term Goals Week 1:  OT Short Term Goal 1 (Week 1): Pt will sequence bathing with MOD VC OT Short Term Goal 1 - Progress (Week 1): Met OT Short Term Goal 2 (Week 1): Pt will teminate grooming appropriately with no VC OT Short Term Goal 2 - Progress (Week 1): Met OT Short Term Goal 3 (Week 1): Pt will don shirt with MIN A OT Short Term Goal 3 - Progress (Week 1): Met OT Short Term Goal 4 (Week 1): Pt will complete 1/3 steps of donning pants OT Short Term Goal 4 - Progress (Week 1): Met Week  2:  OT Short Term Goal 1 (Week 2): Pt will verbalize 1 deficit from TBI with mod cuing OT Short Term Goal 2 (Week 2): Pt will bathe with set up/no cuing for sequencing OT Short Term Goal 3 (Week 2): Pt will don shirt with set up OT Short Term Goal 4 (Week 2): pt will engage in morning ADL routine 3x with no encouragement to demo improved intrinsic motivation   Tonny Branch 04/09/2021, 6:56 AM

## 2021-04-09 NOTE — Progress Notes (Signed)
Physical Therapy Session Note  Patient Details  Name: Scott [name] MRN: [id number] Date of Birth: 1985-10-19  Today's Date: 04/09/2021 PT Individual Time: 1005-1100 PT Individual Time Calculation (min): 55 min   Short Term Goals: Week 1:  PT Short Term Goal 1 (Week 1): Pt will perform bed mobiltiy with supervision assist PT Short Term Goal 2 (Week 1): Pt will ambulate >244f with min assist From PT PT Short Term Goal 3 (Week 1): Pt will attend to task for >5 min without requiring redirection PT Short Term Goal 4 (Week 1): [facility]   Skilled Therapeutic Interventions/Progress Updates:   Pt received supine in bed and agreeable to PT. Supine>sit transfer without assistor cues. [facility] room. Sit<>stand performed without assist from TIS WC throughout session. Wii bowling standing on airex pad x 10 frames with supervision assist and only min assist when stepping off pad. Wii fit table tilt, penguin slide, and bubble run x 1 each with CGA for improved anterior weight shift.    Stair management training x 12 steps with supervision assist and BUE supported. Noted to utilize step over step for both 3 And 6 inch steps.   Gait training in rehab gym with supervision assist x 74fwith min cues for increased BOS width to limit lateral LOB.   Sustained attention/ cognitive task of Chess. Able to complete with competency and accuracy without instruction from PT. Pt returned to room and performed stand pivot transfer to bed with no AD and supervision assist. Sit>supine completed without assist, and left supine in bed with call bell in reach and all needs met.       Therapy Documentation Precautions:  Precautions Precautions: Fall Precaution Comments: waist restraint, PEG Required Braces or Orthoses: Sling Restrictions Weight Bearing Restrictions: Yes RUE Weight Bearing: Weight bearing as  tolerated LUE Weight Bearing: Non weight bearing RLE Weight Bearing: Weight bearing as tolerated LLE Weight Bearing: Weight bearing as tolerated Other Position/Activity Restrictions: gentle ROM allowed to L UE per notes 8/17  Vital Signs: Therapy Vitals Temp: 98.6 F (37 C) Pulse Rate: 95 Resp: 17 BP: (!) 144/94 Patient Position (if appropriate): Lying Oxygen Therapy SpO2: 100 % O2 Device: Room Air Pain: No complaints of pain throughout session   Therapy/Group: Individual Therapy  AuLorie Phenix0/07/2020, 4:52 PM

## 2021-04-09 NOTE — Progress Notes (Signed)
Occupational Therapy TBI Note  Patient Details  Name: Scott [name] MRN: [id number] Date of Birth: 12/18/1985  Today's Date: 04/09/2021 OT Individual Time: 5093-2671 OT Individual Time Calculation (min): 53 min    Short Term Goals: Week 2:  OT Short Term Goal 1 (Week 2): Pt will verbalize 1 deficit from TBI with mod cuing OT Short Term Goal 2 (Week 2): Pt will bathe with set up/no cuing for sequencing OT Short Term Goal 3 (Week 2): Pt will don shirt with set up OT Short Term Goal 4 (Week 2): pt will engage in morning ADL routine 3x with no encouragement to demo improved intrinsic motivation  Skilled Therapeutic Interventions/Progress Updates:  Pt greeted supine in bed agreeable to OT intervention. Session focus on various therapeutic activities focused on higher level cognition. Pt insistent on wanting to go home and perseverating on making sure this OTA writes a "good note so that he can go home" however pt pleasant and ultimately agreeable to session. Pt completed bed mobility with supervision. Pt required MOD A to don shoes from EOB. Pt completed functional ambulation from room to therapy gym with CGA and no AD to work on higher level cognition with BITS. Pt completed standing cognitive task where pt instructed to listen for words and locate words on board to work on [employer] memory and immediate recall skills. Pt completed first trial with a 55.53% accuracy score with pt completing task in 2 mins and 13 seconds with a 4.92 sec reaction time. Pt requested to complete task again. During second trial pt completed task with 86.2% accuracy in 1 min and 43 secs with a 4.41 sec reaction time. Pt completed additional memory retrieval task on the BITS where pt shown 2-7 words together and then pt asked to put words back in order- pt completed this task with 88.89% accuracy, of note when >6 words given pt seemed to be guessing by picking random words. Pt reported throughout session that he has trouble  taking a deep breath- issued pt deep breathing exercises including pursed lip breathing, seated diaphragmatic breathing, and trunk flexion/ extension with coordinated lip breathing. Additionally asked RN about getting pt IS. Pt completed functional mobility back to room with CGA with pt able to return to room with no directional cues. Pt left supine in bed with bed alarm activated and all needs within reach  Therapy Documentation Precautions:  Precautions Precautions: Fall Precaution Comments: waist restraint, PEG Required Braces or Orthoses: Sling Restrictions Weight Bearing Restrictions: Yes RUE Weight Bearing: Weight bearing as tolerated LUE Weight Bearing: Non weight bearing RLE Weight Bearing: Weight bearing as tolerated LLE Weight Bearing: Weight bearing as tolerated Other Position/Activity Restrictions: gentle ROM allowed to L UE per notes 8/17  Pain: pt reports pain in neck; offered to work on stretches however pt reports that makes his neck hurt more. Provided rest breaks and relaxation techniques during session.    Agitated Behavior Scale: TBI Observation Details Observation Environment: pt room Start of observation period - Date: 04/09/21 Start of observation period - Time: 1333 End of observation period - Date: 04/09/21 End of observation period - Time: 1426 Agitated Behavior Scale (DO NOT LEAVE BLANKS) Short attention span, easy distractibility, inability to concentrate: Present to a slight degree Impulsive, impatient, low tolerance for pain or frustration: Present to a slight degree Uncooperative, resistant to care, demanding: Absent Violent and/or threatening violence toward people or property: Absent Explosive and/or unpredictable anger: Absent Rocking, rubbing, moaning, or other self-stimulating behavior: Absent Pulling  at tubes, restraints, etc.: Absent Wandering from treatment areas: Absent Restlessness, pacing, excessive movement: Absent Repetitive behaviors,  motor, and/or verbal: Absent Rapid, loud, or excessive talking: Absent Sudden changes of mood: Absent Easily initiated or excessive crying and/or laughter: Absent Self-abusiveness, physical and/or verbal: Absent Agitated behavior scale total score: 16     Therapy/Group: Individual Therapy  Scott [name] 04/09/2021, 3:52 PM

## 2021-04-09 NOTE — Progress Notes (Signed)
Pt. Vomited this morning after drinking coca cola soda. Denies nausea and refused PRN Zofran. Elevated HOB. Pt. Alert and verbal.

## 2021-04-10 ENCOUNTER — Other Ambulatory Visit: Payer: Self-pay

## 2021-04-10 DIAGNOSIS — I10 Essential (primary) hypertension: Secondary | ICD-10-CM | POA: Diagnosis not present

## 2021-04-10 DIAGNOSIS — R339 Retention of urine, unspecified: Secondary | ICD-10-CM | POA: Diagnosis not present

## 2021-04-10 DIAGNOSIS — S069X3S Unspecified intracranial injury with loss of consciousness of 1 hour to 5 hours 59 minutes, sequela: Secondary | ICD-10-CM | POA: Diagnosis not present
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
## 2021-04-11 DIAGNOSIS — R339 Retention of urine, unspecified: Secondary | ICD-10-CM | POA: Diagnosis not present

## 2021-04-11 DIAGNOSIS — S069X3S Unspecified intracranial injury with loss of consciousness of 1 hour to 5 hours 59 minutes, sequela: Secondary | ICD-10-CM | POA: Diagnosis not present

## 2021-04-11 DIAGNOSIS — T07XXXA Unspecified multiple injuries, initial encounter: Secondary | ICD-10-CM | POA: Diagnosis not present

## 2021-04-11 DIAGNOSIS — I10 Essential (primary) hypertension: Secondary | ICD-10-CM | POA: Diagnosis not present
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
## 2021-04-11 NOTE — Progress Notes (Signed)
Physical Therapy TBI Note  Patient Details  Name: Scott [name] MRN: [id number] Date of Birth: [birth year]  Today's Date: 04/11/2021 PT Individual Time: 1010-1100 PT Individual Time Calculation (min): 50 min   Short Term Goals: Week 2:  PT Short Term Goal 1 (Week 2): STG=LTG due to ELOS  Skilled Therapeutic Interventions/Progress Updates:     Patient in bed upon PT arrival. Patient alert and agreeable to PT session. Patient denied pain during session.  Therapeutic Activity: Bed Mobility: Patient performed supine to/from sit independently x2. Transfers: Patient performed sit to/from stand independently throughout session.   Gait Training:  Patient ambulated throughout the unit with supervision. Patient descended 80 steps (4 flights in the stair well) and ascending 20 steps (1 flight) using R rail with close supervision. Performed reciprocal gait pattern throughout. Provided cues for safety awareness and reduced speed due to impulsivity.  6 Min Walk Test:  Instructed patient to ambulate as quickly and as safely as possible for 6 minutes using LRAD. Patient was allowed to take standing rest breaks without stopping the test, but if the patient required a sitting rest break the clock would be stopped and the test would be over.  Results: 198 feet, stopping to sit after 1 min 32 sec (60 meters). Patient denied symptoms or fatigue when sitting stating, "this is dumb and boring."  Patient ambulated outside over unlevel side-walk outside the [facility] entrance with CGA >500 feet ambulating 5 min and 30 sec before needing sitting rest break.   Patient reported fatigue and requested to return to the room to rest. Encouraged patient to sit up in the TIS for increased sitting tolerance. Patient declined, but agreeable to eating lunch in the TIS w/c.  Patient in bed at end of session with breaks locked, bed alarm set, and all needs within reach. Patient missed 10 min of skilled PT due to fatigue, RN made  aware. Will attempt to make-up missed time as able.     Therapy Documentation Precautions:  Precautions Precautions: Fall Precaution Comments: waist restraint, PEG Required Braces or Orthoses: Sling Restrictions Weight Bearing Restrictions: Yes RUE Weight Bearing: Weight bearing as tolerated LUE Weight Bearing: Non weight bearing RLE Weight Bearing: Weight bearing as tolerated LLE Weight Bearing: Weight bearing as tolerated Other Position/Activity Restrictions: gentle ROM allowed to L UE per notes 8/17 General: PT Amount of Missed Time (min): 10 Minutes PT Missed Treatment Reason: Patient fatigue  Agitated Behavior Scale: TBI Observation Details Observation Environment: patient room Start of observation period - Date: 04/11/21 Start of observation period - Time: 1010 End of observation period - Date: 04/11/21 End of observation period - Time: 1100 Agitated Behavior Scale (DO NOT LEAVE BLANKS) Short attention span, easy distractibility, inability to concentrate: Present to a slight degrees Impulsive, impatient, low tolerance for pain or frustration: Present to a slight degree Uncooperative, resistant to care, demanding: Absent Violent and/or threatening violence toward people or property: Absent Explosive and/or unpredictable anger: Absent Rocking, rubbing, moaning, or other self-stimulating behavior: Absent Pulling at tubes, restraints, etc.: Absent Wandering from treatment areas: Absent Restlessness, pacing, excessive movement: Absent Repetitive behaviors, motor, and/or verbal: Absent Rapid, loud, or excessive talking: Absent Sudden changes of mood: Absent Easily initiated or excessive crying and/or laughter: Absent Self-abusiveness, physical and/or verbal: Absent Agitated behavior scale total score: 16     Therapy/Group: Individual Therapy  Knoah Nedeau L Sheneika Walstad PT, DPT  04/11/2021, 11:01 AM
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
## 2021-04-11 NOTE — Progress Notes (Signed)
Occupational Therapy TBI Note  Patient Details  Name: Scott [name] MRN: [id number] Date of Birth: 06-May-1986  Today's Date: 04/11/2021 OT Individual Time: 7867-6720 OT Individual Time Calculation (min): 25 min   Short Term Goals: Week 2:  OT Short Term Goal 1 (Week 2): Pt will verbalize 1 deficit from TBI with mod cuing OT Short Term Goal 2 (Week 2): Pt will bathe with set up/no cuing for sequencing OT Short Term Goal 3 (Week 2): Pt will don shirt with set up OT Short Term Goal 4 (Week 2): pt will engage in morning ADL routine 3x with no encouragement to demo improved intrinsic motivation  Skilled Therapeutic Interventions/Progress Updates:    Pt greeted semi-reclined in bed with hoodie up, agreeable to OT treatment session. Pt sat EOB with supervsision while nursing administered meds. Discussed pt goals for OT with pt stating he just wanted to go home. Addressed problem solving skills and L hand function with card game. Pt unable to hsuffle cards, but worked on tumb opposition, grasp, and wrist rotation to pick up and flip cards during "Crazy 8's" game." Pt able to understand directions of novelty game and recalled them without cues throughout the game. Pt returned to bed and left semi-reclined in bed with bed alarm on and needs met.  Therapy Documentation Precautions:  Precautions Precautions: Fall Precaution Comments: waist restraint, PEG Required Braces or Orthoses: Sling Restrictions Weight Bearing Restrictions: Yes RUE Weight Bearing: Weight bearing as tolerated LUE Weight Bearing: Non weight bearing RLE Weight Bearing: Weight bearing as tolerated LLE Weight Bearing: Weight bearing as tolerated Other Position/Activity Restrictions: gentle ROM allowed to L UE per notes 8/17 Pain: Pain Assessment Pain Scale: 0-10 Pain Score: 0-No pain Faces Pain Scale: No hurt Agitated Behavior Scale: TBI Observation Details Observation Environment: Room Start of observation period -  Date: 04/11/21 Start of observation period - Time: 0900 End of observation period - Date: 04/11/21 End of observation period - Time: 0930 Agitated Behavior Scale (DO NOT LEAVE BLANKS) Short attention span, easy distractibility, inability to concentrate: Present to a slight degree Impulsive, impatient, low tolerance for pain or frustration: Absent Uncooperative, resistant to care, demanding: Absent Violent and/or threatening violence toward people or property: Absent Explosive and/or unpredictable anger: Absent Rocking, rubbing, moaning, or other self-stimulating behavior: Absent Pulling at tubes, restraints, etc.: Absent Wandering from treatment areas: Absent Restlessness, pacing, excessive movement: Absent Repetitive behaviors, motor, and/or verbal: Absent Rapid, loud, or excessive talking: Absent Sudden changes of mood: Absent Easily initiated or excessive crying and/or laughter: Absent Self-abusiveness, physical and/or verbal: Absent Agitated behavior scale total score: 15    Therapy/Group: Individual Therapy  Valma Cava 04/11/2021, 3:37 PM

## 2021-04-11 NOTE — Progress Notes (Signed)
Occupational Therapy Session Note  Patient Details  Name: Scott [name] MRN: [id number] Date of Birth: Jan 13, 1986  Today's Date: 04/11/2021 OT Individual Time: 8469-6295 OT Individual Time Calculation (min): 27 min    Short Term Goals: Week 1:  OT Short Term Goal 1 (Week 1): Pt will sequence bathing with MOD VC OT Short Term Goal 1 - Progress (Week 1): Met OT Short Term Goal 2 (Week 1): Pt will teminate grooming appropriately with no VC OT Short Term Goal 2 - Progress (Week 1): Met OT Short Term Goal 3 (Week 1): Pt will don shirt with MIN A OT Short Term Goal 3 - Progress (Week 1): Met OT Short Term Goal 4 (Week 1): Pt will complete 1/3 steps of donning pants OT Short Term Goal 4 - Progress (Week 1): Met Week 2:  OT Short Term Goal 1 (Week 2): Pt will verbalize 1 deficit from TBI with mod cuing OT Short Term Goal 2 (Week 2): Pt will bathe with set up/no cuing for sequencing OT Short Term Goal 3 (Week 2): Pt will don shirt with set up OT Short Term Goal 4 (Week 2): pt will engage in morning ADL routine 3x with no encouragement to demo improved intrinsic motivation   Skilled Therapeutic Interventions/Progress Updates:    Pt greeted at time of session semireclined in bed resting agreeable to OT session, wanting to wash laundry. No pain reported throughout session. Bed mobility with Supervision and ambulated throughout room for oral hygiene at sink with CGA/close supervision. Pt able to functionally use LUE for light tasks throughout such as tying pants and reaching for objects but did not attempt to weight bear. Ambulated room <> laundry room same manner with pt remembering location of laundry room. Laundry tasks with supervision only, no assist needed. Back in room set up with alarm on call bell in reach.   Therapy Documentation Precautions:  Precautions Precautions: Fall Precaution Comments: waist restraint, PEG Required Braces or Orthoses: Sling Restrictions Weight Bearing  Restrictions: Yes RUE Weight Bearing: Weight bearing as tolerated LUE Weight Bearing: Non weight bearing RLE Weight Bearing: Weight bearing as tolerated LLE Weight Bearing: Weight bearing as tolerated Other Position/Activity Restrictions: gentle ROM allowed to L UE per notes 8/17    Therapy/Group: Individual Therapy  Viona Gilmore 04/11/2021, 7:14 AM

## 2021-04-11 NOTE — Progress Notes (Signed)
Occupational Therapy Session Note  Patient Details  Name: Scott [name] MRN: [id number] Date of Birth: 12-08-1985  Today's Date: 04/11/2021 OT Individual Time: 1415-1530 OT Individual Time Calculation (min): 75 min    Short Term Goals: Week 2:  OT Short Term Goal 1 (Week 2): Pt will verbalize 1 deficit from TBI with mod cuing OT Short Term Goal 2 (Week 2): Pt will bathe with set up/no cuing for sequencing OT Short Term Goal 3 (Week 2): Pt will don shirt with set up OT Short Term Goal 4 (Week 2): pt will engage in morning ADL routine 3x with no encouragement to demo improved intrinsic motivation  Skilled Therapeutic Interventions/Progress Updates:    Patient in bed, alert and states that his peg tube is bothering him - assisted with repositioning tube away from waist band relieving his discomfort.  He demonstrates good recall of activities completed earlier today and good safety with activities performed.  He requests to complete a shower and change clothing at this time.   Bed mobility mod I, sit to stand and ambulation with room without assistive device to gather clothing and items needed for shower with CS.  Able to undress and perform shower/bathing with CS.  Dressing completed seated and in stance with CS - reviewed safety with balance and recommendation of sitting for lower body dressing - he demonstrates understanding.  C/o fatigue this afternoon and neck soreness with prolonged sitting - returned to bed and became involved in using computer to locate pictures that he had described to me earlier in session - good visual scanning and FMC/cognitive skills for computer use.  He declined further activities out of bed this afternoon.   Overall appropriate affect and behavior t/o session.  Bed alarm set and call bell in reach at close of session.    Therapy Documentation Precautions:  Precautions Precautions: Fall Precaution Comments: waist restraint, PEG Required Braces or Orthoses:  Sling Restrictions Weight Bearing Restrictions: Yes RUE Weight Bearing: Weight bearing as tolerated LUE Weight Bearing: Non weight bearing RLE Weight Bearing: Weight bearing as tolerated LLE Weight Bearing: Weight bearing as tolerated Other Position/Activity Restrictions: gentle ROM allowed to L UE per notes 8/17   Therapy/Group: Individual Therapy  Barrie Lyme 04/11/2021, 7:51 AM

## 2021-04-12 DIAGNOSIS — T07XXXA Unspecified multiple injuries, initial encounter: Secondary | ICD-10-CM | POA: Diagnosis not present

## 2021-04-12 DIAGNOSIS — S069X3S Unspecified intracranial injury with loss of consciousness of 1 hour to 5 hours 59 minutes, sequela: Secondary | ICD-10-CM | POA: Diagnosis not present

## 2021-04-12 DIAGNOSIS — R339 Retention of urine, unspecified: Secondary | ICD-10-CM | POA: Diagnosis not present

## 2021-04-12 DIAGNOSIS — I10 Essential (primary) hypertension: Secondary | ICD-10-CM | POA: Diagnosis not present

## 2021-04-12 NOTE — Progress Notes (Signed)
Patient ID: Scott [name], male   DOB: 20-Feb-1986, 35 y.o.   MRN: [id number]  Met with pt and spoke with April via telephone to inform team conference goals supervision level and discharge now this sat 10/8. Both are pleased with his progress and glad to have a new date. April will be in Thursday for more education prior to his discharge home. [name] to return on Thursday

## 2021-04-12 NOTE — Progress Notes (Signed)
Physical Therapy TBI Note  Patient Details  Name: Scott [name] MRN: [id number] Date of Birth: 1985-12-29  Today's Date: 04/12/2021 PT Individual Time: 0920-0950 and 1400-1455 PT Individual Time Calculation (min): 30 min and 55 min  Short Term Goals: Week 2:  PT Short Term Goal 1 (Week 2): STG=LTG due to ELOS  Skilled Therapeutic Interventions/Progress Updates:     Session 1: Patient in bed upon PT arrival. Patient alert and agreeable to PT session. Patient denied pain during session.  Patient reports poor sleep quality last night with decreased mood due to prolonged hospital stay. Focused session on TBI education, d/c planning, and therapy goals. Provided and reviewed handouts on [facility], TBI symptoms and management, depression and TBI and coping strategies, driving, and balance deficits. Discussed d/c goals and areas for improvement to expedite d/c, and setting up family education with [name] prior to d/c. Patient appropriate and attentive throughout session with appropriate questions and incite into present deficits. Reports he does not plan to drive at d/c, but asked about steps to return to driving, provided education. Patient asked to trial a cane for community ambulation, stated that he ordered one yesterday to help his balance. PT in agreement to trial [facility] this afternoon during PT session.  Patient in bed at end of session with breaks locked, bed alarm set, and all needs within reach.   Session 2: Patient in bed upon PT arrival. Patient alert and agreeable to PT session. Patient denied pain during session.  Patient very excited about d/c date being moved up during team conference with much improved mood this afternoon. Focused session on community integration and continued education.   Patient performed sit to/from stand transfers independently and gait with supervision using SPC throughout session. Provided cues for sequencing with AD. Patient without LOB throughout. Patient  ambulated while performing path finding task from his room to/from the [employer] and outside the [facility] using maps and signs on the walls with min progressing to no cues. Patient carried as self selected object, a cow balloon he named "Ribeye," throughout for additional awareness to L side.  Patient sat outside and reviewed benefits of a daily routine and consistency to improve integration to home life at d/c. Patient able to select appropriate order of daily activities and appropriate activities to engage in at d/c. 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   Patient in bed at end of session with breaks locked, bed alarm set, and all needs within reach.   Therapy Documentation Precautions:  Precautions Precautions: Fall Precaution Comments: waist restraint, PEG Required Braces or Orthoses: Sling Restrictions Weight Bearing Restrictions: No RUE Weight Bearing: Weight bearing as tolerated LUE Weight Bearing: Non weight bearing RLE Weight Bearing: Weight bearing as tolerated LLE Weight Bearing: Weight bearing as tolerated Other Position/Activity Restrictions: gentle ROM allowed to L UE per notes 8/17  Agitated Behavior Scale: TBI Observation Details Observation Environment: CIR Start of observation period - Date: 04/12/21 Start of observation period - Time: 0800 End of observation period - Date: 04/12/21 End of observation period - Time: 0900 Agitated Behavior Scale (DO NOT LEAVE BLANKS) Short attention span, easy distractibility, inability to concentrate: Absent Impulsive, impatient, low tolerance for pain or frustration: Absent Uncooperative, resistant to care, demanding: Absent Violent and/or threatening violence toward people or property: Absent Explosive and/or unpredictable anger: Absent Rocking, rubbing, moaning, or other self-stimulating behavior: Absent Pulling at  tubes,  restraints, etc.: Absent Wandering from treatment areas: Absent Restlessness, pacing, excessive movement: Absent Repetitive behaviors, motor, and/or verbal: Absent Rapid, loud, or excessive talking: Absent Sudden changes of mood: Absent Easily initiated or excessive crying and/or laughter: Absent Self-abusiveness, physical and/or verbal: Absent Agitated behavior scale total score: 14    Therapy/Group: Individual Therapy  Layani Foronda L Arlie Posch 04/12/2021, 10:18 AM
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
## 2021-04-12 NOTE — Progress Notes (Signed)
Occupational Therapy Session Note  Patient Details  Name: RAMESH MOAN MRN: [id number] Date of Birth: 10/09/1985  Today's Date: 04/12/2021 OT Individual Time: 0800-0900 OT Individual Time Calculation (min): 60 min    Short Term Goals: Week 2:  OT Short Term Goal 1 (Week 2): Pt will verbalize 1 deficit from TBI with mod cuing OT Short Term Goal 2 (Week 2): Pt will bathe with set up/no cuing for sequencing OT Short Term Goal 3 (Week 2): Pt will don shirt with set up OT Short Term Goal 4 (Week 2): pt will engage in morning ADL routine 3x with no encouragement to demo improved intrinsic motivation  Skilled Therapeutic Interventions/Progress Updates:    Patient in bed, alert and ready for therapy session.  He notes mild pain in left forearm with activity but otherwise intermittent neck pain with fatigue.  He recalls our session yesterday and has a video ready to show me.  Bed mobility mod I, OH sweatshirt mod I, donns socks and shoes with CS and cues for use of chair to prop feet to improve reach.  Reviewed option of using sock aide, shoe funnel and elastic laces but reach strategy seems to be most effective.  Oral care in stance with CS.  Ambulation in room and on unit without AD CS.  Reviewed and practiced left wrist and hand exercises using light weight, theraputty, foam and FMC activities with household objects - he is able to complete with encouragement and strategies for stretching.  Returned to bed at close of session, good recall and safety t/o.  Bed alarm set and call bell in hand.    Therapy Documentation Precautions:  Precautions Precautions: Fall Precaution Comments: waist restraint, PEG Required Braces or Orthoses: Sling Restrictions Weight Bearing Restrictions: No RUE Weight Bearing: Weight bearing as tolerated LUE Weight Bearing: Non weight bearing RLE Weight Bearing: Weight bearing as tolerated LLE Weight Bearing: Weight bearing as tolerated Other Position/Activity  Restrictions: gentle ROM allowed to L UE per notes 8/17  Therapy/Group: Individual Therapy  Barrie Lyme 04/12/2021, 7:47 AM
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
## 2021-04-13 MED ORDER — POTASSIUM CHLORIDE CRYS ER 20 MEQ PO TBCR
20.0000 meq | EXTENDED_RELEASE_TABLET | Freq: Every day | ORAL | Status: DC
  Administered 2021-04-14 – 2021-04-16 (×3): 20 meq via ORAL
  Filled 2021-04-13 (×3): qty 1
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
## 2021-04-13 NOTE — Progress Notes (Signed)
Occupational Therapy TBI Note  Patient Details  Name: Scott [name] MRN: [id number] Date of Birth: March 11, 1986  Today's Date: 04/13/2021 OT Individual Time: 0905-1005 OT Individual Time Calculation (min): 60 min    Short Term Goals: Week 2:  OT Short Term Goal 1 (Week 2): Pt will verbalize 1 deficit from TBI with mod cuing OT Short Term Goal 2 (Week 2): Pt will bathe with set up/no cuing for sequencing OT Short Term Goal 3 (Week 2): Pt will don shirt with set up OT Short Term Goal 4 (Week 2): pt will engage in morning ADL routine 3x with no encouragement to demo improved intrinsic motivation  Skilled Therapeutic Interventions/Progress Updates:    Pt received in bed with 7 out of 10 pain in neck, and agreeable to OT session focusing on morning routine. Repositioning provided for pain relief. Pt declined heat pack. BP supine 112/67 and SPO2 100%.   ADL: Pt completes BADL at overall CGA level. Skilled interventions include: Pt demonstrates impulsivity during tx and rushed through morning routine, resulting in SOB & required rest breaks. Pt with consistent lack of righting reactions while walking, did not acknowledge deficits or attempt to correct. Pt reported the need to void BM prior to showering, successful void of BM on commode. Pt stood up from toilet without waiting for OT. Pt able to gather items for shower from around room with MIN cuing. Overall, supervision for components of bathing, required MIN multimodal cuing to notice soap residue on R side of body. Pt used L arm sparingly. Able to close fingers into a fist but kept elbow at 90 degrees for majority of shower. Pt stated it hurt to move d/t fx. Pt with c/o pain in neck throughout shower. Demonstrated good awareness by placing towels and drying floor before standing. Pt dressed UB/LB with close (S). Cleaned up after shower with MIN directional cuing to gather all dirty towels. TED hose was donned with A from OT. Attempted to make bed  with clean sheets, pt put sheet on inside out and did not align on bed properly. Demonstrated poor problem solving with functional task, insisting he could just hire someone to do things for him.Required MIN educational cuing to put sheets on correct way. Pt with low motivation & frustration tolerance, stating "I'm over it" multiple times. Able to problem-solve attaching balloon to windowsill before going back to bed.  Pt left at end of session in bed with exit alarm on, call light in reach and all needs met   Therapy Documentation Precautions:  Precautions Precautions: Fall Precaution Comments: waist restraint, PEG Required Braces or Orthoses: Sling Restrictions Weight Bearing Restrictions: Yes RUE Weight Bearing: Weight bearing as tolerated LUE Weight Bearing: Non weight bearing RLE Weight Bearing: Weight bearing as tolerated LLE Weight Bearing: Weight bearing as tolerated Other Position/Activity Restrictions: gentle ROM allowed to L UE per notes 8/17  Agitated Behavior Scale: TBI Observation Details Observation Environment: Pt's room Start of observation period - Date: 04/13/21 Start of observation period - Time: 0900 End of observation period - Date: 04/13/21 End of observation period - Time: 1000 Agitated Behavior Scale (DO NOT LEAVE BLANKS) Short attention span, easy distractibility, inability to concentrate: Absent Impulsive, impatient, low tolerance for pain or frustration: Absent Uncooperative, resistant to care, demanding: Absent Violent and/or threatening violence toward people or property: Absent Explosive and/or unpredictable anger: Absent Rocking, rubbing, moaning, or other self-stimulating behavior: Absent Pulling at tubes, restraints, etc.: Absent Wandering from treatment areas: Absent Restlessness, pacing, excessive movement:  Absent Repetitive behaviors, motor, and/or verbal: Absent Rapid, loud, or excessive talking: Absent Sudden changes of mood: Absent Easily  initiated or excessive crying and/or laughter: Absent Self-abusiveness, physical and/or verbal: Absent Agitated behavior scale total score: 14   Therapy/Group: Individual Therapy  Ludene Stokke 04/13/2021, 12:48 PM
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
## 2021-04-14 ENCOUNTER — Other Ambulatory Visit (HOSPITAL_COMMUNITY): Payer: Self-pay

## 2021-04-14 MED ORDER — OXYCODONE HCL 5 MG PO TABS: 10.0000 mg | ORAL_TABLET | ORAL | Status: DC | PRN

## 2021-04-14 MED ORDER — QUETIAPINE FUMARATE 50 MG PO TABS
50.0000 mg | ORAL_TABLET | Freq: Every day | ORAL | 0 refills | Status: DC
  Filled 2021-04-14: qty 30, 30d supply, fill #0

## 2021-04-14 MED ORDER — OXYCODONE HCL 10 MG PO TABS
5.0000 mg | ORAL_TABLET | ORAL | 0 refills | Status: DC | PRN
  Filled 2021-04-14: qty 30, 5d supply, fill #0

## 2021-04-14 MED ORDER — FOLIC ACID 1 MG PO TABS
1.0000 mg | ORAL_TABLET | Freq: Every day | ORAL | 0 refills | Status: DC
  Filled 2021-04-14: qty 30, 30d supply, fill #0

## 2021-04-14 MED ORDER — FREE WATER
50.0000 mL | Freq: Every day | Status: DC
  Administered 2021-04-15 – 2021-04-16 (×2): 50 mL

## 2021-04-14 MED ORDER — ACETAMINOPHEN 325 MG PO TABS: 325.0000 mg | ORAL_TABLET | ORAL | Status: AC | PRN

## 2021-04-14 MED ORDER — CLONAZEPAM 0.5 MG PO TABS
0.5000 mg | ORAL_TABLET | Freq: Two times a day (BID) | ORAL | 0 refills | Status: DC
Start: 2021-04-14 — End: 2021-04-27
  Filled 2021-04-14: qty 60, 30d supply, fill #0

## 2021-04-14 MED ORDER — METOPROLOL TARTRATE 50 MG PO TABS
150.0000 mg | ORAL_TABLET | Freq: Three times a day (TID) | ORAL | 0 refills | Status: DC
  Filled 2021-04-14: qty 270, 30d supply, fill #0

## 2021-04-14 MED ORDER — METHOCARBAMOL 500 MG PO TABS
1000.0000 mg | ORAL_TABLET | Freq: Three times a day (TID) | ORAL | 0 refills | Status: DC | PRN
  Filled 2021-04-14: qty 90, 15d supply, fill #0

## 2021-04-14 MED ORDER — ADULT MULTIVITAMIN W/MINERALS CH: 1.0000 | ORAL_TABLET | Freq: Every day | ORAL | Status: AC

## 2021-04-14 MED ORDER — APIXABAN 5 MG PO TABS
5.0000 mg | ORAL_TABLET | Freq: Two times a day (BID) | ORAL | 0 refills | Status: DC
  Filled 2021-04-14: qty 60, 30d supply, fill #0

## 2021-04-14 MED ORDER — LISINOPRIL 20 MG PO TABS
20.0000 mg | ORAL_TABLET | Freq: Every evening | ORAL | 0 refills | Status: DC
  Filled 2021-04-14: qty 30, 30d supply, fill #0
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
## 2021-04-14 NOTE — Progress Notes (Signed)
Occupational Therapy TBI Note  Patient Details  Name: Scott [name] MRN: [id number] Date of Birth: [birth year]  Today's Date: 04/14/2021 OT Individual Time: 0850-1005 OT Individual Time Calculation (min): 75 min    Short Term Goals: Week 1:  OT Short Term Goal 1 (Week 1): Pt will sequence bathing with MOD VC OT Short Term Goal 1 - Progress (Week 1): Met OT Short Term Goal 2 (Week 1): Pt will teminate grooming appropriately with no VC OT Short Term Goal 2 - Progress (Week 1): Met OT Short Term Goal 3 (Week 1): Pt will don shirt with MIN A OT Short Term Goal 3 - Progress (Week 1): Met OT Short Term Goal 4 (Week 1): Pt will complete 1/3 steps of donning pants OT Short Term Goal 4 - Progress (Week 1): Met Week 2:  OT Short Term Goal 1 (Week 2): Pt will verbalize 1 deficit from TBI with mod cuing OT Short Term Goal 2 (Week 2): Pt will bathe with set up/no cuing for sequencing OT Short Term Goal 3 (Week 2): Pt will don shirt with set up OT Short Term Goal 4 (Week 2): pt will engage in morning ADL routine 3x with no encouragement to demo improved intrinsic motivation  Skilled Therapeutic Interventions/Progress Updates:    Pt received in supine with no c/o pain and agreeable to OT session focusing on discharge planning, family education and self-care routines. Pt's girlfriend April present for education. Pt educated on TBI recovery, safety precautions, and current deficits. Pt acknowledging one deficit - nods when April brings up getting into his truck and says "I can't do that". Pt with mild insisting that there are no current deficits of balance or safety awareness. 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 Discussed no use of power tools, firearms, lawnmowers or vehicles. Pt and April verbalized understanding. Pt and April both aware that MD will provide clearance for return to driving.   TBI Educational Handouts Provided: - driving - irritability - alcohol -  behavior changes - neck pain  ADL: Pt completes BADL at overall close (S) level. Skilled interventions include: Pt declines showering today. Pt completes bed mobility with close (S) and grooming sink level with (S) for oral hygiene and brushing hair. Pt with mild rushing through tasks at times. Pt received educational cues to slow down for safety and environmental awareness.   Therapeutic activity Pt completed functional mobility to all therapeutic destinations with close (S) from OT or girlfriend April. Pt practiced using shower/tub in ADL apartment with forward stepping technique to simulate home environment. Pt stepped over tub sideways/forwards x 2 with no LOB. Sit > stand from recliner with distant supervision. Pt completed kitchen safety awareness locating 5/5 unsafe items with no cuing. Pt educated on strategies for safe IADL tasks while at home. Pt and April verbalized understanding of close (S) upon d/c, especially when showering and donning clothing in seated. April return demonstrated supervision of pt while he completed tasks in apartment and kitchen.  Pt left at end of session in chair with April present , SLP entering room, call light in reach and all needs met.    Therapy Documentation Precautions:  Precautions Precautions: Fall Precaution Comments: waist restraint, PEG Required Braces or Orthoses: Sling Restrictions Weight Bearing Restrictions: Yes RUE Weight Bearing: Weight bearing as tolerated LUE Weight Bearing: Non weight bearing RLE Weight Bearing: Weight bearing as tolerated LLE Weight Bearing: Weight bearing as tolerated Other Position/Activity Restrictions: gentle ROM allowed to  L UE per notes 8/17 Agitated Behavior Scale: TBI Observation Details Observation Environment: Pts room/apt Start of observation period - Date: 04/14/21 Start of observation period - Time: 0850 End of observation period - Date: 04/14/21 End of observation period - Time: 1000 Agitated  Behavior Scale (DO NOT LEAVE BLANKS) Short attention span, easy distractibility, inability to concentrate: Absent Impulsive, impatient, low tolerance for pain or frustration: Absent Uncooperative, resistant to care, demanding: Absent Violent and/or threatening violence toward people or property: Absent Explosive and/or unpredictable anger: Absent Rocking, rubbing, moaning, or other self-stimulating behavior: Absent Pulling at tubes, restraints, etc.: Absent Wandering from treatment areas: Absent Restlessness, pacing, excessive movement: Absent Repetitive behaviors, motor, and/or verbal: Absent Rapid, loud, or excessive talking: Absent Sudden changes of mood: Absent Easily initiated or excessive crying and/or laughter: Absent Self-abusiveness, physical and/or verbal: Absent Agitated behavior scale total score: 14  Therapy/Group: Individual Therapy  Makenlee Mckeag 04/14/2021, 1:25 PM

## 2021-04-14 NOTE — Progress Notes (Signed)
Inpatient Rehabilitation Medication Review by a Pharmacist  A complete drug regimen review was completed for this patient to identify any potential clinically significant medication issues.  High Risk Drug Classes Is patient taking? Indication by Medication  Antipsychotic Yes Quetiapine for mood  Anticoagulant Yes Apixaban for DVT treatment  Antibiotic No   Opioid Yes Oxycodone PRN pain  Antiplatelet No   Hypoglycemics/insulin No   Vasoactive Medication Yes Metoprolol, lisinopril for hypertension  Chemotherapy No   Other No      Type of Medication Issue Identified Description of Issue Recommendation(s)  Drug Interaction(s) (clinically significant)     Duplicate Therapy     Allergy     No Medication Administration End Date     Incorrect Dose     Additional Drug Therapy Needed     Significant med changes from prior encounter (inform family/care partners about these prior to discharge).    Other       Clinically significant medication issues were identified that warrant physician communication and completion of prescribed/recommended actions by midnight of the next day:  No  Name of provider notified for urgent issues identified: not applicable  Time spent performing this drug regimen review (minutes):   Ulyses Southward, PharmD, BCIDP, AAHIVP, CPP Infectious Disease Pharmacist 04/14/2021 8:35 AM
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
## 2021-04-14 NOTE — Progress Notes (Signed)
Physical Therapy Session Note  Patient Details  Name: Scott [name] MRN: [id number] Date of Birth: 11/29/1985  Today's Date: 04/14/2021 PT Individual Time:  -      Short Term Goals: Week 2:  PT Short Term Goal 1 (Week 2): STG=LTG due to ELOS  Skilled Therapeutic Interventions/Progress Updates: Pt presents supine in bed and agreeable to therapy.  S.O. present for family ed during entire session.  Pt transfers sup to sit w/ independence and sat at EOB to don shoes w/ supervision.  Adjusted SPC for proper height.  Pt transfers sit to stand multiple trials during session from bed, outside benches and car w/ mod I.  Pt amb using SPC into hallways and required occasional ed on 2-step gait pattern, placement and grip of cane for improved control.  Pt negotiated 20 steps w/ R hand rail up and down, holding cane w/ reciprocal gait pattern.  Ed on safety and speed.  Pt transferred in and out of simulated car w/ supervision.  Pt stepped into car w/ 1 leg before sitting and educated on safety.  Pt negotiated ramp and mulch pit w/ supervision and cues for safety.  Pt amb on and off elevators to outdoors near [facility].  Pt amb on uneven surfaces w/ SPC and supervision, verbal cues for safe turns and negotiating along curbs.  Pt amb multiple trials down ramped driveway and back up.  Education on foot slap and improving foot clearance when uphill.  Pt educated on increased foot slap when fatigued.  Pt returned to room and transferred into bed w/ mod I, doffing shoes independently.  No further questions for pt or S.O.  S.O. remained in room.     Therapy Documentation Precautions:  Precautions Precautions: Fall Precaution Comments: waist restraint, PEG Required Braces or Orthoses: Sling Restrictions Weight Bearing Restrictions: Yes RUE Weight Bearing: Weight bearing as tolerated LUE Weight Bearing: Non weight bearing RLE Weight Bearing: Weight bearing as tolerated LLE Weight Bearing: Weight bearing as  tolerated Other Position/Activity Restrictions: gentle ROM allowed to L UE per notes 8/17 General:   Vital Signs:   Pain: no c/o pain.       Therapy/Group: Individual Therapy  Lucio Edward 04/14/2021, 11:52 AM

## 2021-04-14 NOTE — Progress Notes (Addendum)
Physical Therapy Session Note  Patient Details  Name: Scott [name] MRN: [id number] Date of Birth: 1986-05-21  Today's Date: 04/13/2021 PT Individual Time:  1132-1200 and 1300-1341  PT Individual Time Calculation (min):  28 min and 41 min   Short Term Goals: Week 1:  PT Short Term Goal 1 (Week 1): Pt will perform bed mobiltiy with supervision assist PT Short Term Goal 1 - Progress (Week 1): Met PT Short Term Goal 2 (Week 1): Pt will ambulate >276f with min assist From PT PT Short Term Goal 2 - Progress (Week 1): Met PT Short Term Goal 3 (Week 1): Pt will attend to task for >5 min without requiring redirection PT Short Term Goal 3 - Progress (Week 1): Met PT Short Term Goal 4 (Week 1): [facility] Short Term Goal 4 - Progress (Week 1): Met Week 2:  PT Short Term Goal 1 (Week 2): STG=LTG due to ELOS   Skilled Therapeutic Interventions/Progress Updates:  Session 1: Patient supine in bed on entrance to room watching movie on laptop. Patient alert and agreeable to PT session. Toilets with close/ distant supervision with no cueing.  Patient with no pain complaint throughout session.  Therapeutic Activity: Bed Mobility: Patient performed supine <> sit with Mod I. Dons shoes in seated position with no physical assist.  Transfers: Patient performed sit<>stand and stand pivot transfers throughout session with Mod I. No vc required for technique or safety.  Neuromuscular Re-ed: NMR facilitated during session with focus on standing balance and dual tasking. Pt guided in standing therex while watching movie and passing weighted ball. Completes minisquats, forward and side lunges, continuous toe touches.  NMR performed for improvements in motor control and coordination, balance, sequencing, judgement, and self confidence/ efficacy in performing all aspects of mobility at highest level of independence.   Patient supine  in bed at end of session with  brakes locked, bed alarm set, and all needs within reach.    Session 2: Patient supine in bed on entrance to room. Patient alert and agreeable to PT session.   Patient with no pain complaint throughout session.  Gait Training/ NMR:  Patient ambulated to front of building with no rest breaks until reaching bench outdoors. Pt able to hold discussion throughout relating personal information re: family, home environment, likes/ dislikes, activities he's looking forward to getting back to doing on d/c - mainly being able to get up and move around freely without alarms going off. Minimal instances of pause in ambulation to think prior to answering. 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 Completes ambulation over even/ uneven and ramped surfaces. Therapeutic rest breaks taken twice throughout prn. Ambulates back up to room from front entrance with no breaks and no pauses in ambulation while continuing discussion of tree work.  NMR performed for improvements in motor control and coordination, balance, sequencing, judgement, and self confidence/ efficacy in performing all aspects of mobility at highest level of independence.   Patient supine  in bed at end of session with brakes locked, bed alarm set, and all needs within reach.   Therapy Documentation Precautions:  Precautions Precautions: Fall Precaution Comments: waist restraint, PEG Required Braces or Orthoses: Sling Restrictions Weight Bearing Restrictions: Yes RUE Weight Bearing: Weight bearing as tolerated LUE Weight Bearing: Non weight bearing RLE Weight Bearing: Weight bearing as tolerated LLE Weight Bearing: Weight bearing as tolerated Other Position/Activity Restrictions: gentle ROM allowed to  L UE per notes 8/17   Pain: No pain complaint during both sessions.  Agitated Behavior Scale: TBI Observation Details Observation Environment: Pt's room/ Outside Start of observation period - Date:  04/13/21 Start of observation period - Time: 1300 End of observation period - Date: 04/13/21 End of observation period - Time: 1341 Agitated Behavior Scale (DO NOT LEAVE BLANKS) Short attention span, easy distractibility, inability to concentrate: Absent Impulsive, impatient, low tolerance for pain or frustration: Absent Uncooperative, resistant to care, demanding: Absent Violent and/or threatening violence toward people or property: Absent Explosive and/or unpredictable anger: Absent Rocking, rubbing, moaning, or other self-stimulating behavior: Absent Pulling at tubes, restraints, etc.: Absent Wandering from treatment areas: Absent Restlessness, pacing, excessive movement: Absent Repetitive behaviors, motor, and/or verbal: Absent Rapid, loud, or excessive talking: Absent Sudden changes of mood: Absent Easily initiated or excessive crying and/or laughter: Absent Self-abusiveness, physical and/or verbal: Absent Agitated behavior scale total score: 14  Therapy/Group: Individual Therapy  Alger Simons PT, DPT 04/13/2021, 12:52 PM
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
## 2021-04-14 NOTE — Progress Notes (Addendum)
Patient ID: Scott [name], male   DOB: [birth year], 35 y.o.   MRN: [id number] Met with pt and girlfriend-April who is here for education. Discussed OP rehab and both report [facility] is closer to them then any [facility] OP facility. [facility] [phone] to get fax number for referral [phone]-fax-PT & OT [phone] for SP. Will fax order and ask them to contact April to set up appointments. Made aware unsure of co-pays for appointments. Faxed information to both PT & OT and [facility] centers

## 2021-04-15 ENCOUNTER — Other Ambulatory Visit (HOSPITAL_COMMUNITY): Payer: Self-pay

## 2021-04-15 NOTE — Progress Notes (Signed)
Occupational Therapy TBI Note  Patient Details  Name: FARZAD TIBBETTS MRN: [id number] Date of Birth: 1986/06/29  Today's Date: 04/15/2021 OT Individual Time: 1515-1601 OT Individual Time Calculation (min): 46 min    Short Term Goals: Week 1:  OT Short Term Goal 1 (Week 1): Pt will sequence bathing with MOD VC OT Short Term Goal 1 - Progress (Week 1): Met OT Short Term Goal 2 (Week 1): Pt will teminate grooming appropriately with no VC OT Short Term Goal 2 - Progress (Week 1): Met OT Short Term Goal 3 (Week 1): Pt will don shirt with MIN A OT Short Term Goal 3 - Progress (Week 1): Met OT Short Term Goal 4 (Week 1): Pt will complete 1/3 steps of donning pants OT Short Term Goal 4 - Progress (Week 1): Met Week 2:  OT Short Term Goal 1 (Week 2): Pt will verbalize 1 deficit from TBI with mod cuing OT Short Term Goal 2 (Week 2): Pt will bathe with set up/no cuing for sequencing OT Short Term Goal 3 (Week 2): Pt will don shirt with set up OT Short Term Goal 4 (Week 2): pt will engage in morning ADL routine 3x with no encouragement to demo improved intrinsic motivation Week 3:     Skilled Therapeutic Interventions/Progress Updates:    Pt received in bed with no c/o pain and agreeable to OT session focusing on path finding, problem-solving, sequencing and visual perceptual re-assessment. Pt close (S) with no AD to all therapeutic destinations. Pt with slight LOB with vision occluded (putting on mask while walking).    Therapeutic activity Pt completes Trail Making B activity on the BITS for alternating numbers/letters in standing. Pt with numerous errors during first demo attempt due to rushing - required cuing to slow down initially for increased accuracy. Pt with 100% accuracy at 1 min 10 seconds after demo and MIN cuing for following alternating number/letter pattern. Pt worked on path finding through hospital to use an unfamiliar exit to go outside. Pt able to locate elevators with signs to  N exit. Received further education on slowing down during functional activities for safety and decreased errors. Pt agrees that this would benefit him at work for future purposes. Pt able to return to room from unfamiliar area of hospital with no cuing. Pt left at end of session in bed with exit alarm on, call light in reach and all needs met.    Therapy Documentation Precautions:  Precautions Precautions: Fall Precaution Comments:  (PEG) Required Braces or Orthoses: Sling Restrictions Weight Bearing Restrictions: Yes RUE Weight Bearing: Weight bearing as tolerated LUE Weight Bearing: Non weight bearing RLE Weight Bearing: Weight bearing as tolerated LLE Weight Bearing: Weight bearing as tolerated Other Position/Activity Restrictions: gentle ROM allowed to L UE per notes 8/17  Pain:  No c/o pain Agitated Behavior Scale: TBI Observation Details Observation Environment: pts room/gym/outside Start of observation period - Date: 04/15/21 Start of observation period - Time: 1515 End of observation period - Date: 04/15/21 End of observation period - Time: 1601 Agitated Behavior Scale (DO NOT LEAVE BLANKS) Short attention span, easy distractibility, inability to concentrate: Absent Impulsive, impatient, low tolerance for pain or frustration: Absent Uncooperative, resistant to care, demanding: Absent Violent and/or threatening violence toward people or property: Absent Explosive and/or unpredictable anger: Absent Rocking, rubbing, moaning, or other self-stimulating behavior: Absent Pulling at tubes, restraints, etc.: Absent Wandering from treatment areas: Absent Restlessness, pacing, excessive movement: Absent Repetitive behaviors, motor, and/or verbal: Absent Rapid, loud,  or excessive talking: Absent Sudden changes of mood: Absent Easily initiated or excessive crying and/or laughter: Absent Self-abusiveness, physical and/or verbal: Absent Agitated behavior scale total score:  14    Therapy/Group: Individual Therapy    04/15/2021, 4:12 PM
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
## 2021-04-15 NOTE — Plan of Care (Signed)
  Problem: RH Balance °Goal: LTG Patient will maintain dynamic sitting balance (PT) °Description: LTG:  Patient will maintain dynamic sitting balance with assistance during mobility activities (PT) °Outcome: Completed/Met °Goal: LTG Patient will maintain dynamic standing balance (PT) °Description: LTG:  Patient will maintain dynamic standing balance with assistance during mobility activities (PT) °Outcome: Completed/Met °  °Problem: RH Bed Mobility °Goal: LTG Patient will perform bed mobility with assist (PT) °Description: LTG: Patient will perform bed mobility with assistance, with/without cues (PT). °Outcome: Completed/Met °  °Problem: RH Bed to Chair Transfers °Goal: LTG Patient will perform bed/chair transfers w/assist (PT) °Description: LTG: Patient will perform bed to chair transfers with assistance (PT). °Outcome: Completed/Met °  °Problem: RH Car Transfers °Goal: LTG Patient will perform car transfers with assist (PT) °Description: LTG: Patient will perform car transfers with assistance (PT). °Outcome: Completed/Met °  °Problem: RH Ambulation °Goal: LTG Patient will ambulate in controlled environment (PT) °Description: LTG: Patient will ambulate in a controlled environment, # of feet with assistance (PT). °Outcome: Completed/Met °Goal: LTG Patient will ambulate in home environment (PT) °Description: LTG: Patient will ambulate in home environment, # of feet with assistance (PT). °Outcome: Completed/Met °  °Problem: RH Stairs °Goal: LTG Patient will ambulate up and down stairs w/assist (PT) °Description: LTG: Patient will ambulate up and down # of stairs with assistance (PT) °Outcome: Completed/Met °  °

## 2021-04-15 NOTE — Progress Notes (Signed)
Speech Language Pathology Discharge Summary  Patient Details  Name: Scott [name] MRN: [id number] Date of Birth: 1985/11/04  Today's Date: 04/15/2021 SLP Individual Time: 1345-1415 SLP Individual Time Calculation (min): 30 min   Skilled Therapeutic Interventions:  Skilled treatment session focused on cognitive goals. SLP facilitated session by providing extra time for patient to donn his shoes. Patient ambulated to and from the SLP office with supervision. Patient participated in a complex scheduling task and required supervision verbal cues for problem solving. Patient was Mod I for selective attention in a mildly distracting environment for ~20 minutes. Patient left upright in bed with alarm on and all needs within reach.    Patient has met 8 of 8 long term goals.  Patient to discharge at overall Supervision level.   Reasons goals not met: N/A   Clinical Impression/Discharge Summary: Patient has made excellent gains and has met 8 of 8 LTGs this admission. Currently, patient demonstrates behaviors consistent with a Rancho Level VIII and requires overall supervision level verbal cues to complete complex tasks safely in regards to problem solving, attention and awareness. Patient has a hoarse vocal quality, suspect due to prolonged intubation but is 100% intelligible at the sentence level. Patient is consuming regular textures with thin liquids without overt s/s of aspiration and is overall Mod I for use of swallowing compensatory strategies. Patient and family education is complete and patient will discharge home with 24 hour supervision from his significant other. Patient would benefit from f/u SLP services to maximize his cognitive functioning and overall functional independence in order to reduce caregiver burden.   Care Partner:  Caregiver Able to Provide Assistance: Yes  Type of Caregiver Assistance: Physical;Cognitive  Recommendation:  Outpatient SLP;24 hour supervision/assistance   Rationale for SLP Follow Up: Reduce caregiver burden;Maximize cognitive function and independence   Equipment: N/A   Reasons for discharge: Discharged from hospital;Treatment goals met   Patient/Family Agrees with Progress Made and Goals Achieved: Yes    Murle Otting, [place] 04/15/2021, 6:11 AM

## 2021-04-15 NOTE — Progress Notes (Signed)
Family member visited and refused to wear gown and gloves in contact isolation room with posted signage and reminder from staff. "I won't wear them but you can feel free to." Noted to supervisor.   Patient medications to be taken home given to visiting family to take home.  Scott [name]

## 2021-04-15 NOTE — Progress Notes (Signed)
Physical Therapy Discharge Summary  Patient Details  Name: Scott [name] MRN: [id number] Date of Birth: 07-21-1985  Today's Date: 04/15/2021 PT Individual Time: 0905-1000 PT Individual Time Calculation (min): 55 min    Patient has met 8 of 8 long term goals due to improved activity tolerance, improved balance, improved postural control, increased strength, increased range of motion, decreased pain, ability to compensate for deficits, functional use of  left lower extremity, improved attention, improved awareness, and improved coordination.  Patient to discharge at an ambulatory level Modified Independent.   Patient's care partner is independent to provide the necessary physical and cognitive assistance at discharge.  Reasons goals not met: All PT goals met.   Recommendation:  Patient will benefit from ongoing skilled PT services in outpatient setting to continue to advance safe functional mobility, address ongoing impairments in balance, awareness, strength, coordinaiotn, and minimize fall risk.  Equipment: [facility]  Reasons for discharge: treatment goals met and discharge from hospital  Patient/family agrees with progress made and goals achieved: Yes   PT treatment.  Pt received supine in bed and agreeable to PT. Supine>sit transfer without assist or cues. Ambulatory transfer to bathroom for urination, pt able to void standing at toilet without assist. PT instructed pt in Grad day assessment to measure progress toward goals. See below for details. PT instructed pt in DGI. See below for results. Demonstrates increased fall risk with score of 21/24. (<19 indicates increased fall risk)   Patient demonstrates low fall risk as noted by score of   53/56 on Berg Balance Scale.  (<36= high risk for falls, close to 100%; 37-45 significant >80%; 46-51 moderate >50%; 52-55 lower >25%) Car transfer training with SPC and no assist from PT as well ambulation through mulch and ramp. Gait training with  SPC x 277f, 2536fand [id number] required use of SPC for safety, but no LOB noted; mild foot slap on the RLE throughout, no change with fatigue on this day.     PT Discharge Precautions/Restrictions Fall. NWB LUE  Pain Pain Assessment Pain Scale: 0-10 Faces Pain Scale: No hurt Pain Interference Pain Interference Pain Effect on Sleep: 1. Rarely or not at all Pain Interference with Therapy Activities: 1. Rarely or not at all Pain Interference with Day-to-Day Activities: 2. Occasionally Vision/Perception  Vision - Assessment Additional Comments: mild malalignment on the R eye with Lateral tracking. no dipolia noted. 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 Coordination Gross Motor Movements are Fluid and Coordinated: Yes Fine Motor Movements are Fluid and Coordinated: Yes Coordination and Movement Description: grossly within functional limits Heel Shin Test: [facility]  Motor Motor: Hemiplegia Motor - Discharge Observations: mild left sided hemiplegia  Mobility Bed Mobility Bed Mobility: Sit to Supine;Supine to Sit;Rolling Right Rolling Right: Independent Supine to Sit: Independent Sit to Supine: Independent Transfers Sit to Stand: Independent Stand to Sit: Independent Stand Pivot Transfers: Independent with assistive device Transfer (Assistive device): Straight cane Locomotion  Gait Ambulation: Yes Gait Assistance: Independent with assistive device Gait Distance (Feet): 250 Feet Assistive device: Straight cane Gait Gait: Yes Gait Pattern: Impaired Gait Pattern: Left foot flat Stairs / Additional Locomotion Stairs: Yes Stairs Assistance: Independent with assistive device Stair Management  Technique: One rail Right Number of Stairs: 12 Height of Stairs: 6 Curb: Independent with assistive  device Wheelchair Mobility Wheelchair Mobility: No  Trunk/Postural Assessment  Cervical Assessment Cervical Assessment: Within Functional Limits Thoracic Assessment Thoracic Assessment: Within Functional Limits Lumbar Assessment Lumbar Assessment: Within Functional Limits Postural Control Postural Control: Within Functional Limits  Balance Standardized Balance Assessment Standardized Balance Assessment: Berg Balance Test Berg Balance Test Sit to Stand: Able to stand without using hands and stabilize independently Standing Unsupported: Able to stand safely 2 minutes Sitting with Back Unsupported but Feet Supported on Floor or Stool: Able to sit safely and securely 2 minutes Stand to Sit: Sits safely with minimal use of hands Transfers: Able to transfer safely, minor use of hands Standing Unsupported with Eyes Closed: Able to stand 10 seconds safely Standing Ubsupported with Feet Together: Able to place feet together independently and stand 1 minute safely From Standing, Reach Forward with Outstretched Arm: Can reach confidently >25 cm (10") From Standing Position, Pick up Object from Floor: Able to pick up shoe safely and easily From Standing Position, Turn to Look Behind Over each Shoulder: Looks behind from both sides and weight shifts well Turn 360 Degrees: Able to turn 360 degrees safely one side only in 4 seconds or less Standing Unsupported, Alternately Place Feet on Step/Stool: Able to stand independently and complete 8 steps >20 seconds Standing Unsupported, One Foot in Front: Able to plae foot ahead of the other independently and hold 30 seconds Standing on One Leg: Able to lift leg independently and hold > 10 seconds Total Score: 53 Dynamic Gait Index Level Surface: Mild Impairment Change in Gait Speed: Normal Gait with Horizontal Head Turns: Normal Gait with Vertical  Head Turns: Mild Impairment Gait and Pivot Turn: Normal Step Over Obstacle: Normal Step Around Obstacles: Normal Steps: Mild Impairment Total Score: 21 Extremity Assessment      RLE Assessment RLE Assessment: Exceptions to [facility] RLE Strength RLE Overall Strength: Deficits Right Hip Flexion: 4+/5 Right Hip Extension: 4+/5 Right Hip ABduction: 4+/5 Right Hip ADduction: 5/5 Right Knee Flexion: 5/5 Right Knee Extension: 5/5 Right Ankle Dorsiflexion: 3/5 Right Ankle Plantar Flexion: 4-/5 LLE Assessment LLE Assessment: Exceptions to [facility] LLE Strength Left Hip Flexion: 4+/5 Left Hip Extension: 4+/5 Left Hip ABduction: 4+/5 Left Hip ADduction: 5/5 Left Knee Flexion: 5/5 Left Knee Extension: 5/5 Left Ankle Dorsiflexion: 2+/5 Left Ankle Plantar Flexion: 3-/5    Lorie Phenix 04/15/2021, 11:32 AM

## 2021-04-15 NOTE — Progress Notes (Signed)
Patient ID: Scott [name], male   DOB: 11-May-1986, 35 y.o.   MRN: [id number]  SW spoke with Brittany/[facility] (p: [phone]/Fax for PT/OT to #[phone]-fax-PT & OT and fax for SLP# [phone]) to confirm referral received. SW was informed they do not accept [facility], and there is $100 deposit for initial therapy appointment, and then $75 deposit at each therapy session plus the services patient will be billed for.   SW spoke with his girlfriend April [phone]) to discuss above. Prefers to go to a location that accepts [facility]. SW informed can send referral to a [facility] facility based on their location since [facility] is only accepting patient's within their health system. SW provided her with contact information for site and encouraged follow-up by next Friday if she has not received a phone call.   SW faxed referral to [facility] ([phone]/f:[phone]). SW left message to confirm if referral was received. SW updated d/c instructions.  Cecile Sheerer, MSW, LCSWA Office: [phone] Cell: [phone] Fax: [phone]

## 2021-04-15 NOTE — Progress Notes (Signed)
Occupational Therapy Discharge Summary  Patient Details  Name: Scott [name] MRN: [id number] Date of Birth: Sep 13, 1985  Today's Date: 04/15/2021 OT Individual Time: 1100-1200 OT Individual Time Calculation (min): 60 min   D/C Session: Pt greeted supine in bed, no c/o pain and very agreeable to OT discharge session. Pt in good spirits and is very happy to be going home tomorrow! Pt able to answer all orientation questions with 100% accuracy and demonstrated ability to dual task by having conversation about how his accident happened while in shower. Pt completes all showering + dressing tasks with overall close (S). Able to gather clothes and items needed for shower with directional cue. Pt demonstrated good safety awareness by sitting to doff clothing in shower and putting towels down to stop water flow. Pt held conversation appropriately and talked about safety at home. Pt mentioned prior use of marijuana and attributed his SOB partially to that, but also mentioned that he was getting SOB d/t being in the hospital for months. States that he will not be using marijuana after d/c. BP assessed seated: 90/68, 108/71 after shower. Pt needed VC to sit to don pants at bed level as he initially attempted to do so in standing. Pt left in bed, with needs met and bed alarm on.   _______________________________________________________________________ Pt has improved during his time at [facility], demonstrating progress by advancing from overall MOD-MAX with basic self-care skills to overall SUPERVISION level at discharge. Pt is eager to go home tomorrow and joked frequently during tx. Pt has supportive significant other that he lives with. 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 Pt demonstrates increased cognition for functional BADL/IADL tasks by increased accuracy and problem solving skills evident  by ability to complete morning routine.   Patient has met 13 of 13 long term goals due to improved activity tolerance, improved balance, postural control, ability to compensate for deficits, improved attention, improved awareness, and improved coordination.  Patient to discharge at overall Supervision level.  Patient's care partner is independent to provide the necessary physical and cognitive assistance at discharge.    Reasons goals not met:   Recommendation:  Patient will benefit from ongoing skilled OT services in outpatient setting to continue to advance functional skills in the area of BADL, iADL, Vocation, and Reduce care partner burden.  Equipment: N/A  Reasons for discharge: treatment goals met and discharge from hospital  Patient/family agrees with progress made and goals achieved: Yes  OT Discharge Precautions/Restrictions  Precautions Precautions: Fall Precaution Comments:  (PEG) Restrictions Weight Bearing Restrictions: Yes RUE Weight Bearing: Weight bearing as tolerated LUE Weight Bearing: Non weight bearing RLE Weight Bearing: Weight bearing as tolerated LLE Weight Bearing: Weight bearing as tolerated Other Position/Activity Restrictions: gentle ROM allowed to L UE per notes 8/17 General   Vital Signs  Pain Pain Assessment Pain Scale: 0-10 Pain Score: 0-No pain Faces Pain Scale: No hurt ADL ADL Eating: Independent Where Assessed-Eating: Edge of bed Grooming: Supervision/safety Where Assessed-Grooming: Standing at sink Upper Body Bathing: Supervision/safety Where Assessed-Upper Body Bathing: Shower Lower Body Bathing: Supervision/safety Where Assessed-Lower Body Bathing: Shower Upper Body Dressing: Supervision/safety Where Assessed-Upper Body Dressing: Edge of bed Lower Body Dressing: Supervision/safety Where Assessed-Lower Body Dressing: Edge of bed Toileting: Supervision/safety Where Assessed-Toileting: Glass blower/designer: Close  supervision Toilet Transfer Method: Theatre manager Transfer: Close supervison Clinical cytogeneticist Method: Optometrist: Civil engineer, contracting with back Social research officer, government:  Close supervision Social research officer, government Method: Print production planner with back Vision Baseline Vision/History: 0 No visual deficits Patient Visual Report: No change from baseline Additional Comments: mild malalignment on the R eye with Lateral tracking. no dipolia noted. peripheral visual WFL Perception  Perception: Within Functional Limits Inattention/Neglect: Appears intact Praxis Praxis: Intact Cognition Overall Cognitive Status: Impaired/Different from baseline Arousal/Alertness: Awake/alert Orientation Level: Oriented X4 Year: 2022 Month: October Day of Week: Correct Attention: Sustained;Focused Focused Attention: Appears intact Sustained Attention: Appears intact Memory: Appears intact Immediate Memory Recall: Sock;Blue;Bed Memory Recall Sock: Without Cue Memory Recall Blue: Without Cue Memory Recall Bed: Without Cue Awareness: Impaired Awareness Impairment: Anticipatory impairment Problem Solving: Impaired Problem Solving Impairment: Functional complex Behaviors: Impulsive;Poor frustration tolerance Safety/Judgment: Impaired [employer] Scales of Cognitive Functioning: Purposeful/appropriate Sensation Sensation Light Touch: Appears Intact Hot/Cold: Appears Intact Proprioception: Appears Intact Stereognosis: Appears Intact Additional Comments: 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 Coordination Gross Motor Movements are Fluid and Coordinated: No (L UE limited by pain d/t fx) Fine Motor Movements are Fluid and Coordinated: Yes Coordination and Movement Description: grossly within functional limits Heel Shin Test: [facility] Motor  Motor Motor: Abnormal postural alignment and control Motor - Discharge  Observations: mild left sided hemiplegia Mobility  Bed Mobility Bed Mobility: Sit to Supine;Supine to Sit;Rolling Right Rolling Right: Independent Supine to Sit: Independent Sit to Supine: Independent Transfers Sit to Stand: Independent Stand to Sit: Independent  Trunk/Postural Assessment  Cervical Assessment Cervical Assessment: Exceptions to [facility] (forward head d/t neck pain) Thoracic Assessment Thoracic Assessment: Within Functional Limits Lumbar Assessment Lumbar Assessment: Within Functional Limits Postural Control Postural Control: Within Functional Limits  Balance Balance Balance Assessed: Yes Standardized Balance Assessment Standardized Balance Assessment: Berg Balance Test Berg Balance Test Sit to Stand: Able to stand without using hands and stabilize independently Standing Unsupported: Able to stand safely 2 minutes Sitting with Back Unsupported but Feet Supported on Floor or Stool: Able to sit safely and securely 2 minutes Stand to Sit: Sits safely with minimal use of hands Transfers: Able to transfer safely, minor use of hands Standing Unsupported with Eyes Closed: Able to stand 10 seconds safely Standing Ubsupported with Feet Together: Able to place feet together independently and stand 1 minute safely From Standing, Reach Forward with Outstretched Arm: Can reach confidently >25 cm (10") From Standing Position, Pick up Object from Floor: Able to pick up shoe safely and easily From Standing Position, Turn to Look Behind Over each Shoulder: Looks behind from both sides and weight shifts well Turn 360 Degrees: Able to turn 360 degrees safely one side only in 4 seconds or less Standing Unsupported, Alternately Place Feet on Step/Stool: Able to stand independently and complete 8 steps >20 seconds Standing Unsupported, One Foot in Front: Able to plae foot ahead of the other independently and hold 30 seconds Standing on One Leg: Able to lift leg independently and hold > 10  seconds Total Score: 53 Dynamic Gait Index Level Surface: Mild Impairment Change in Gait Speed: Normal Gait with Horizontal Head Turns: Normal Gait with Vertical Head Turns: Mild Impairment Gait and Pivot Turn: Normal Step Over Obstacle: Normal Step Around Obstacles: Normal Steps: Mild Impairment Total Score: 21 Static Sitting Balance Static Sitting - Balance Support: No upper extremity supported Static Sitting - Level of Assistance: 7: Independent Dynamic Sitting Balance Dynamic Sitting - Balance Support: No upper extremity supported Dynamic Sitting - Level of Assistance: 7: Independent Static Standing Balance  Static Standing - Balance Support: No upper extremity supported Static Standing - Level of Assistance: 6: Modified independent (Device/Increase time) Extremity/Trunk Assessment RUE Assessment RUE Assessment: Within Functional Limits LUE Assessment LUE Assessment: Exceptions to [facility] General Strength Comments: clavicle/scap fx therefore no formal testing completed   Chananya Canizalez 04/15/2021, 12:36 PM

## 2021-04-16 NOTE — Progress Notes (Signed)
Patient discharged off of unit with all belongings. Discharge papers/instructions explained by physician assistant to family. Patient and family have no further questions at time of discharge. No complications noted at this time.  Scott Holsapple C Wilmer Jr.   

## 2021-04-18 NOTE — Progress Notes (Addendum)
Inpatient Rehabilitation Care Coordinator Discharge Note   Patient Details  Name: Scott [name] MRN: [id number] Date of Birth: 1986/04/06   Discharge location: D/c to home with girlfriend April  Length of Stay: 14 days  Discharge activity level: Supervision  Home/community participation: Limited  Patient response [id number] Literacy - How often do you need to have someone help you when you read instructions, pamphlets, or other written material from your doctor or pharmacy?: Never  Patient response [id number] Isolation - How often do you feel lonely or isolated from those around you?: Never  Services provided included: MD, RD, PT, OT, SLP, RN, CM, TR, Pharmacy, Neuropsych, SW  Financial Services:  Field seismologist Utilized: Private Insurance [employer]  Choices offered to/list presented to: Yes  Follow-up services arranged:  Outpatient    Outpatient Servicies: [facility] for PT/OT/SLP  Patient response to transportation need: Is the patient able to respond to transportation needs?: Yes In the past 12 months, has lack of transportation kept you from medical appointments or from getting medications?: No In the past 12 months, has lack of transportation kept you from meetings, work, or from getting things needed for daily living?: No   Comments (or additional information):  Patient/Family verbalized understanding of follow-up arrangements:  Yes  Individual responsible for coordination of the follow-up plan: Contact April #[phone]  Confirmed correct DME delivered: Scott [name] 04/18/2021    Scott [name]

## 2021-04-19 ENCOUNTER — Other Ambulatory Visit: Payer: Self-pay

## 2021-04-19 ENCOUNTER — Encounter: Payer: Self-pay | Admitting: Occupational Therapy

## 2021-04-19 ENCOUNTER — Ambulatory Visit: Payer: 59 | Admitting: Occupational Therapy

## 2021-04-19 ENCOUNTER — Encounter: Payer: Self-pay | Admitting: Physical Therapy

## 2021-04-19 ENCOUNTER — Ambulatory Visit: Payer: 59 | Admitting: Physical Therapy

## 2021-04-19 DIAGNOSIS — R278 Other lack of coordination: Secondary | ICD-10-CM | POA: Insufficient documentation

## 2021-04-19 DIAGNOSIS — R29818 Other symptoms and signs involving the nervous system: Secondary | ICD-10-CM

## 2021-04-19 DIAGNOSIS — R41841 Cognitive communication deficit: Secondary | ICD-10-CM | POA: Insufficient documentation

## 2021-04-19 DIAGNOSIS — S066XAA Traumatic subarachnoid hemorrhage with loss of consciousness status unknown, initial encounter: Secondary | ICD-10-CM | POA: Diagnosis not present

## 2021-04-19 DIAGNOSIS — R2681 Unsteadiness on feet: Secondary | ICD-10-CM | POA: Insufficient documentation

## 2021-04-19 DIAGNOSIS — M6281 Muscle weakness (generalized): Secondary | ICD-10-CM | POA: Insufficient documentation

## 2021-04-19 DIAGNOSIS — R2689 Other abnormalities of gait and mobility: Secondary | ICD-10-CM | POA: Insufficient documentation

## 2021-04-19 DIAGNOSIS — M25512 Pain in left shoulder: Secondary | ICD-10-CM

## 2021-04-19 DIAGNOSIS — S069X0A Unspecified intracranial injury without loss of consciousness, initial encounter: Secondary | ICD-10-CM

## 2021-04-19 DIAGNOSIS — R29898 Other symptoms and signs involving the musculoskeletal system: Secondary | ICD-10-CM

## 2021-04-19 DIAGNOSIS — R41844 Frontal lobe and executive function deficit: Secondary | ICD-10-CM

## 2021-04-19 NOTE — Patient Instructions (Signed)
Access Code: [email or web address] URL: [email or web address] Date: 04/19/2021 Prepared by: Oley Balm  Exercises Step Up - 1 x daily - 7 x weekly - 3 sets - 10 reps Lateral Step Up - 1 x daily - 7 x weekly - 3 sets - 10 reps Side Stepping with Resistance at Feet - 1 x daily - 7 x weekly - 3 sets - 10 reps
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
## 2021-04-20 ENCOUNTER — Encounter: Payer: Self-pay | Admitting: Speech Pathology

## 2021-04-20 ENCOUNTER — Ambulatory Visit: Payer: 59 | Admitting: Speech Pathology

## 2021-04-20 DIAGNOSIS — R41841 Cognitive communication deficit: Secondary | ICD-10-CM

## 2021-04-20 DIAGNOSIS — S066XAA Traumatic subarachnoid hemorrhage with loss of consciousness status unknown, initial encounter: Secondary | ICD-10-CM | POA: Diagnosis not present
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
## 2021-04-25 ENCOUNTER — Ambulatory Visit: Payer: 59 | Admitting: Speech Pathology

## 2021-04-27 ENCOUNTER — Encounter: Payer: Self-pay | Admitting: Occupational Therapy

## 2021-04-27 ENCOUNTER — Other Ambulatory Visit: Payer: Self-pay

## 2021-04-27 ENCOUNTER — Encounter: Payer: Self-pay | Admitting: Registered Nurse

## 2021-04-27 ENCOUNTER — Ambulatory Visit: Payer: 59 | Admitting: Occupational Therapy

## 2021-04-27 ENCOUNTER — Encounter: Payer: 59 | Attending: Registered Nurse | Admitting: Registered Nurse

## 2021-04-27 ENCOUNTER — Ambulatory Visit: Payer: 59 | Admitting: Physical Therapy

## 2021-04-27 DIAGNOSIS — R278 Other lack of coordination: Secondary | ICD-10-CM

## 2021-04-27 DIAGNOSIS — M25512 Pain in left shoulder: Secondary | ICD-10-CM | POA: Insufficient documentation

## 2021-04-27 DIAGNOSIS — R000 Tachycardia, unspecified: Secondary | ICD-10-CM

## 2021-04-27 DIAGNOSIS — M6281 Muscle weakness (generalized): Secondary | ICD-10-CM

## 2021-04-27 DIAGNOSIS — R29818 Other symptoms and signs involving the nervous system: Secondary | ICD-10-CM

## 2021-04-27 DIAGNOSIS — R29898 Other symptoms and signs involving the musculoskeletal system: Secondary | ICD-10-CM

## 2021-04-27 DIAGNOSIS — R41844 Frontal lobe and executive function deficit: Secondary | ICD-10-CM | POA: Insufficient documentation

## 2021-04-27 DIAGNOSIS — R2689 Other abnormalities of gait and mobility: Secondary | ICD-10-CM

## 2021-04-27 DIAGNOSIS — S069X3S Unspecified intracranial injury with loss of consciousness of 1 hour to 5 hours 59 minutes, sequela: Secondary | ICD-10-CM

## 2021-04-27 DIAGNOSIS — S066XAA Traumatic subarachnoid hemorrhage with loss of consciousness status unknown, initial encounter: Secondary | ICD-10-CM | POA: Diagnosis not present

## 2021-04-27 DIAGNOSIS — S069X0A Unspecified intracranial injury without loss of consciousness, initial encounter: Secondary | ICD-10-CM
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
## 2021-04-27 NOTE — Patient Instructions (Signed)
Grip Strengthening (Resistive Putty)    Squeeze putty using thumb and all fingers. Repeat 20 times. Do 2-3 sessions per day.  Palmar Pinch Strengthening (Resistive Putty)    Pinch putty between thumb and each fingertip in turn. Repeat 5 times along length of putty. Do 2-3 sessions per day.  Copyright  VHI. All rights reserved.
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
## 2021-04-28 ENCOUNTER — Other Ambulatory Visit (HOSPITAL_COMMUNITY): Payer: Self-pay

## 2021-04-28 ENCOUNTER — Telehealth (HOSPITAL_COMMUNITY): Payer: Self-pay
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
## 2021-04-28 NOTE — Telephone Encounter (Signed)
Transitions of Care Pharmacy  ° °Call attempted for a pharmacy transitions of care follow-up. HIPAA appropriate voicemail was left with call back information provided.  ° °Call attempt #1. Will follow-up in 2-3 days.  °  °

## 2021-04-29 ENCOUNTER — Telehealth (HOSPITAL_COMMUNITY): Payer: Self-pay

## 2021-04-29 NOTE — Telephone Encounter (Signed)
Transitions of Care Pharmacy   Call attempted for a pharmacy transitions of care follow-up. Unable to leave voicemail.   Call attempt #2. Will follow-up in 2-3 days.    

## 2021-05-02 ENCOUNTER — Telehealth (HOSPITAL_COMMUNITY): Payer: Self-pay

## 2021-05-02 NOTE — Telephone Encounter (Signed)
Transitions of Care Pharmacy   Call attempted for a pharmacy transitions of care follow-up. Unable to leave voicemail.  Call attempt #3. Will no longer attempt follow up for TOC pharmacy   

## 2021-05-03 ENCOUNTER — Telehealth: Payer: Self-pay | Admitting: Physical Therapy

## 2021-05-03 ENCOUNTER — Ambulatory Visit: Payer: 59 | Admitting: Speech Pathology

## 2021-05-03 ENCOUNTER — Ambulatory Visit: Payer: 59 | Admitting: Physical Therapy

## 2021-05-03 NOTE — Telephone Encounter (Signed)
No-show for appt on 10/25- attempted to call but no answer/unable to leave VM.   Madelaine Etienne, DPT, PN2   Supplemental Physical Therapist [facility]    Pager [phone] Acute Rehab Office [phone]

## 2021-05-05 ENCOUNTER — Ambulatory Visit: Payer: 59 | Admitting: Speech Pathology

## 2021-05-05 ENCOUNTER — Ambulatory Visit: Payer: 59 | Admitting: Physical Therapy

## 2021-05-05 ENCOUNTER — Ambulatory Visit: Payer: 59 | Admitting: Occupational Therapy

## 2021-05-09 ENCOUNTER — Telehealth: Payer: Self-pay | Admitting: *Deleted

## 2021-05-09 ENCOUNTER — Ambulatory Visit: Payer: 59 | Admitting: Occupational Therapy

## 2021-05-09 ENCOUNTER — Ambulatory Visit: Payer: 59 | Admitting: Physical Therapy

## 2021-05-09 ENCOUNTER — Ambulatory Visit: Payer: 59 | Admitting: Speech Pathology

## 2021-05-09 NOTE — Telephone Encounter (Signed)
April, SO of Scott [name] called to request a refill on his pain medication.  She mentioned he needed to make an appt for a NCS/EMG.  He has a follow up with Dr Riley Kill 06/29/21. Last refill on his pain med was from discharge 04/14/21 oxycodone 10 mg #30.

## 2021-05-10 NOTE — Telephone Encounter (Signed)
This was reported as "not taking" last month

## 2021-05-12 ENCOUNTER — Ambulatory Visit: Payer: 59 | Admitting: Physical Therapy

## 2021-05-12 ENCOUNTER — Ambulatory Visit: Payer: 59 | Admitting: Speech Pathology

## 2021-05-12 NOTE — Telephone Encounter (Signed)
I spoke with Scott [name] and [name] about his medication.   1) He has been having increased pain with his shoulder and has been taking the medication.  He does see the orthopedist on Monday but does not have medication to get to that appointment.   2) Also they are asking about his other medications Dan prescribed. He is on Eliquis and I do not know who will be following him for that medication.  3) They talked with Riley Lam at his HFU appt about getting an EMG/NCS since he is having a lot of nerve pain.  Should they schedule that with you?

## 2021-05-13 MED ORDER — OXYCODONE HCL 10 MG PO TABS: 5.0000 mg | ORAL_TABLET | ORAL | 0 refills | Status: DC | PRN

## 2021-05-13 MED ORDER — APIXABAN 5 MG PO TABS: 5.0000 mg | ORAL_TABLET | Freq: Two times a day (BID) | ORAL | 0 refills | Status: DC

## 2021-05-13 MED ORDER — LISINOPRIL 20 MG PO TABS: 20.0000 mg | ORAL_TABLET | Freq: Every evening | ORAL | 0 refills | Status: DC

## 2021-05-13 MED ORDER — METOPROLOL TARTRATE 50 MG PO TABS: 150.0000 mg | ORAL_TABLET | Freq: Three times a day (TID) | ORAL | 0 refills | Status: DC

## 2021-05-13 MED ORDER — FOLIC ACID 1 MG PO TABS: 1.0000 mg | ORAL_TABLET | Freq: Every day | ORAL | 0 refills | Status: DC

## 2021-05-13 NOTE — Telephone Encounter (Signed)
I refilled oxy and eliquis. He needs to be seen by ortho or me before we order NCS/EMG.  thx

## 2021-05-16 ENCOUNTER — Ambulatory Visit: Payer: 59 | Admitting: Physical Therapy

## 2021-05-16 ENCOUNTER — Ambulatory Visit: Payer: 59 | Admitting: Speech Pathology

## 2021-05-16 ENCOUNTER — Ambulatory Visit: Payer: 59 | Admitting: Occupational Therapy

## 2021-05-17 ENCOUNTER — Encounter: Payer: Self-pay | Admitting: Speech Pathology

## 2021-05-17 ENCOUNTER — Ambulatory Visit: Payer: 59 | Admitting: Occupational Therapy

## 2021-05-17 ENCOUNTER — Ambulatory Visit: Payer: 59 | Attending: Physical Medicine and Rehabilitation | Admitting: Speech Pathology

## 2021-05-17 ENCOUNTER — Other Ambulatory Visit: Payer: Self-pay

## 2021-05-17 DIAGNOSIS — R41841 Cognitive communication deficit: Secondary | ICD-10-CM | POA: Insufficient documentation

## 2021-05-17 DIAGNOSIS — R2681 Unsteadiness on feet: Secondary | ICD-10-CM | POA: Insufficient documentation

## 2021-05-17 DIAGNOSIS — R29818 Other symptoms and signs involving the nervous system: Secondary | ICD-10-CM | POA: Diagnosis present

## 2021-05-17 DIAGNOSIS — R2689 Other abnormalities of gait and mobility: Secondary | ICD-10-CM | POA: Insufficient documentation

## 2021-05-17 DIAGNOSIS — S069X0A Unspecified intracranial injury without loss of consciousness, initial encounter: Secondary | ICD-10-CM | POA: Diagnosis present

## 2021-05-17 DIAGNOSIS — M25512 Pain in left shoulder: Secondary | ICD-10-CM | POA: Insufficient documentation

## 2021-05-17 DIAGNOSIS — M6281 Muscle weakness (generalized): Secondary | ICD-10-CM | POA: Diagnosis present

## 2021-05-17 DIAGNOSIS — R278 Other lack of coordination: Secondary | ICD-10-CM | POA: Insufficient documentation

## 2021-05-17 DIAGNOSIS — R41844 Frontal lobe and executive function deficit: Secondary | ICD-10-CM | POA: Diagnosis present

## 2021-05-17 DIAGNOSIS — R29898 Other symptoms and signs involving the musculoskeletal system: Secondary | ICD-10-CM | POA: Diagnosis present
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
## 2021-05-24 ENCOUNTER — Ambulatory Visit: Payer: 59 | Admitting: Physical Therapy

## 2021-05-24 ENCOUNTER — Ambulatory Visit: Payer: 59 | Admitting: Speech Pathology

## 2021-05-24 ENCOUNTER — Ambulatory Visit: Payer: 59 | Admitting: Occupational Therapy

## 2021-05-31 ENCOUNTER — Ambulatory Visit: Payer: 59 | Admitting: Occupational Therapy

## 2021-05-31 ENCOUNTER — Encounter: Payer: Self-pay | Admitting: Speech Pathology

## 2021-05-31 ENCOUNTER — Other Ambulatory Visit: Payer: Self-pay

## 2021-05-31 ENCOUNTER — Ambulatory Visit: Payer: 59 | Admitting: Speech Pathology

## 2021-05-31 ENCOUNTER — Encounter: Payer: Self-pay | Admitting: Occupational Therapy

## 2021-05-31 ENCOUNTER — Ambulatory Visit: Payer: 59 | Admitting: Physical Therapy

## 2021-05-31 DIAGNOSIS — R29898 Other symptoms and signs involving the musculoskeletal system: Secondary | ICD-10-CM

## 2021-05-31 DIAGNOSIS — R2689 Other abnormalities of gait and mobility: Secondary | ICD-10-CM

## 2021-05-31 DIAGNOSIS — R41841 Cognitive communication deficit: Secondary | ICD-10-CM

## 2021-05-31 DIAGNOSIS — R278 Other lack of coordination: Secondary | ICD-10-CM

## 2021-05-31 DIAGNOSIS — R29818 Other symptoms and signs involving the nervous system: Secondary | ICD-10-CM

## 2021-05-31 DIAGNOSIS — R2681 Unsteadiness on feet: Secondary | ICD-10-CM

## 2021-05-31 DIAGNOSIS — M6281 Muscle weakness (generalized): Secondary | ICD-10-CM

## 2021-05-31 DIAGNOSIS — M25512 Pain in left shoulder: Secondary | ICD-10-CM

## 2021-05-31 DIAGNOSIS — S069X0A Unspecified intracranial injury without loss of consciousness, initial encounter: Secondary | ICD-10-CM

## 2021-05-31 DIAGNOSIS — R41844 Frontal lobe and executive function deficit: Secondary | ICD-10-CM
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
## 2021-06-07 ENCOUNTER — Encounter: Payer: Self-pay | Admitting: Speech Pathology

## 2021-06-07 ENCOUNTER — Other Ambulatory Visit: Payer: Self-pay

## 2021-06-07 ENCOUNTER — Ambulatory Visit: Payer: 59 | Admitting: Speech Pathology

## 2021-06-07 ENCOUNTER — Ambulatory Visit: Payer: 59 | Admitting: Occupational Therapy

## 2021-06-07 ENCOUNTER — Encounter: Payer: Self-pay | Admitting: Occupational Therapy

## 2021-06-07 DIAGNOSIS — R41841 Cognitive communication deficit: Secondary | ICD-10-CM | POA: Diagnosis not present

## 2021-06-07 DIAGNOSIS — R29818 Other symptoms and signs involving the nervous system: Secondary | ICD-10-CM

## 2021-06-07 DIAGNOSIS — R29898 Other symptoms and signs involving the musculoskeletal system: Secondary | ICD-10-CM

## 2021-06-07 DIAGNOSIS — M6281 Muscle weakness (generalized): Secondary | ICD-10-CM

## 2021-06-07 DIAGNOSIS — M25512 Pain in left shoulder: Secondary | ICD-10-CM

## 2021-06-07 DIAGNOSIS — R278 Other lack of coordination: Secondary | ICD-10-CM
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
## 2021-06-09 ENCOUNTER — Encounter: Payer: Self-pay | Admitting: Physical Therapy

## 2021-06-09 ENCOUNTER — Ambulatory Visit: Payer: 59 | Attending: Physical Medicine and Rehabilitation | Admitting: Physical Therapy

## 2021-06-09 ENCOUNTER — Other Ambulatory Visit: Payer: Self-pay

## 2021-06-09 DIAGNOSIS — R29818 Other symptoms and signs involving the nervous system: Secondary | ICD-10-CM | POA: Insufficient documentation

## 2021-06-09 DIAGNOSIS — S069X0A Unspecified intracranial injury without loss of consciousness, initial encounter: Secondary | ICD-10-CM | POA: Diagnosis present

## 2021-06-09 DIAGNOSIS — M6281 Muscle weakness (generalized): Secondary | ICD-10-CM | POA: Diagnosis not present

## 2021-06-09 DIAGNOSIS — R278 Other lack of coordination: Secondary | ICD-10-CM | POA: Diagnosis present

## 2021-06-09 DIAGNOSIS — R2681 Unsteadiness on feet: Secondary | ICD-10-CM | POA: Diagnosis present

## 2021-06-09 DIAGNOSIS — R2689 Other abnormalities of gait and mobility: Secondary | ICD-10-CM | POA: Diagnosis present

## 2021-06-09 DIAGNOSIS — M25512 Pain in left shoulder: Secondary | ICD-10-CM | POA: Diagnosis present

## 2021-06-09 DIAGNOSIS — R29898 Other symptoms and signs involving the musculoskeletal system: Secondary | ICD-10-CM | POA: Diagnosis present

## 2021-06-09 DIAGNOSIS — R41844 Frontal lobe and executive function deficit: Secondary | ICD-10-CM | POA: Diagnosis present

## 2021-06-09 DIAGNOSIS — R52 Pain, unspecified: Secondary | ICD-10-CM | POA: Insufficient documentation
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
## 2021-06-13 ENCOUNTER — Telehealth: Payer: Self-pay

## 2021-06-13 MED ORDER — APIXABAN 5 MG PO TABS: 5.0000 mg | ORAL_TABLET | Freq: Two times a day (BID) | ORAL | 3 refills | Status: DC

## 2021-06-13 MED ORDER — OXYCODONE HCL 10 MG PO TABS: 5.0000 mg | ORAL_TABLET | ORAL | 0 refills | Status: AC | PRN

## 2021-06-13 MED ORDER — FOLIC ACID 1 MG PO TABS: 1.0000 mg | ORAL_TABLET | Freq: Every day | ORAL | 3 refills | Status: DC

## 2021-06-13 NOTE — Telephone Encounter (Signed)
Patient is calling for a refill of Folic Acid, Eliquis and Oxycodone. I am not sure if we are following him for the Eliquis. Last fill date for Oxycodone was 05/14/21.

## 2021-06-13 NOTE — Telephone Encounter (Signed)
Patient notified

## 2021-06-13 NOTE — Telephone Encounter (Signed)
Rxs sent

## 2021-06-14 ENCOUNTER — Ambulatory Visit: Payer: 59 | Admitting: Physical Therapy

## 2021-06-14 ENCOUNTER — Ambulatory Visit: Payer: 59 | Admitting: Occupational Therapy

## 2021-06-21 ENCOUNTER — Ambulatory Visit: Payer: 59 | Admitting: Occupational Therapy

## 2021-06-21 ENCOUNTER — Ambulatory Visit: Payer: 59 | Admitting: Physical Therapy

## 2021-06-27 ENCOUNTER — Telehealth: Payer: Self-pay

## 2021-06-27 NOTE — Telephone Encounter (Signed)
Patient's significant other called to get a refill on Lisinopril. She stated it was given to him after he was discharged from hospital. Informed her that PCP would have to take over this medication. She stated he does have an appointment with them on Thursday 06/30/21

## 2021-06-28 ENCOUNTER — Ambulatory Visit: Payer: 59 | Admitting: Physical Therapy

## 2021-06-28 ENCOUNTER — Encounter: Payer: Self-pay | Admitting: Occupational Therapy

## 2021-06-28 ENCOUNTER — Ambulatory Visit: Payer: 59 | Admitting: Occupational Therapy

## 2021-06-28 ENCOUNTER — Other Ambulatory Visit: Payer: Self-pay

## 2021-06-28 DIAGNOSIS — R29818 Other symptoms and signs involving the nervous system: Secondary | ICD-10-CM

## 2021-06-28 DIAGNOSIS — R41844 Frontal lobe and executive function deficit: Secondary | ICD-10-CM

## 2021-06-28 DIAGNOSIS — R29898 Other symptoms and signs involving the musculoskeletal system: Secondary | ICD-10-CM

## 2021-06-28 DIAGNOSIS — R278 Other lack of coordination: Secondary | ICD-10-CM

## 2021-06-28 DIAGNOSIS — R52 Pain, unspecified: Secondary | ICD-10-CM

## 2021-06-28 DIAGNOSIS — M25512 Pain in left shoulder: Secondary | ICD-10-CM

## 2021-06-28 DIAGNOSIS — M6281 Muscle weakness (generalized): Secondary | ICD-10-CM
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
## 2021-06-29 ENCOUNTER — Encounter: Payer: 59 | Attending: Registered Nurse | Admitting: Physical Medicine & Rehabilitation

## 2021-06-29 ENCOUNTER — Encounter: Payer: Self-pay | Admitting: Physical Medicine & Rehabilitation

## 2021-06-29 ENCOUNTER — Other Ambulatory Visit: Payer: Self-pay

## 2021-06-29 VITALS — BP 105/74 | HR 69 | Temp 98.8°F | Ht 71.0 in | Wt 157.4 lb

## 2021-06-29 DIAGNOSIS — R339 Retention of urine, unspecified: Secondary | ICD-10-CM

## 2021-06-29 DIAGNOSIS — S069X0S Unspecified intracranial injury without loss of consciousness, sequela: Secondary | ICD-10-CM | POA: Insufficient documentation

## 2021-06-29 DIAGNOSIS — I10 Essential (primary) hypertension: Secondary | ICD-10-CM

## 2021-06-29 MED ORDER — METOPROLOL TARTRATE 50 MG PO TABS: 150.0000 mg | ORAL_TABLET | Freq: Two times a day (BID) | ORAL | 0 refills | Status: AC

## 2021-06-29 MED ORDER — GABAPENTIN 300 MG PO CAPS: 300.0000 mg | ORAL_CAPSULE | Freq: Three times a day (TID) | ORAL | 3 refills | Status: AC
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
## 2021-06-29 NOTE — Patient Instructions (Addendum)
PLEASE FEEL FREE TO CALL OUR OFFICE WITH ANY PROBLEMS OR QUESTIONS [phone])  LOPRESSOR: DECREASE TO TWICE DAILY   GABAPENTIN (NERVE PAIN) (300MG ) START 1 CAP AT BEDTIME FOR 3 DAYS, THEN TWICE DAILY FOR 3 DAYS THEN THREE X DAILY   IF YOU FEEL GROGGY, OFF BALANCE, THEN DECREASED TO PRIOR FREQUENCY. IE, IF YOU'RE TAKING 3X DAILY, DECREASE TO 2X DAILY.

## 2021-07-05 ENCOUNTER — Other Ambulatory Visit: Payer: Self-pay

## 2021-07-05 ENCOUNTER — Ambulatory Visit: Payer: 59 | Admitting: Occupational Therapy

## 2021-07-05 ENCOUNTER — Ambulatory Visit: Payer: 59 | Admitting: Physical Therapy

## 2021-07-05 ENCOUNTER — Encounter: Payer: Self-pay | Admitting: Occupational Therapy

## 2021-07-05 DIAGNOSIS — R29818 Other symptoms and signs involving the nervous system: Secondary | ICD-10-CM

## 2021-07-05 DIAGNOSIS — M25512 Pain in left shoulder: Secondary | ICD-10-CM

## 2021-07-05 DIAGNOSIS — R29898 Other symptoms and signs involving the musculoskeletal system: Secondary | ICD-10-CM

## 2021-07-05 DIAGNOSIS — S069X0A Unspecified intracranial injury without loss of consciousness, initial encounter: Secondary | ICD-10-CM

## 2021-07-05 DIAGNOSIS — R41844 Frontal lobe and executive function deficit: Secondary | ICD-10-CM

## 2021-07-05 DIAGNOSIS — R2681 Unsteadiness on feet: Secondary | ICD-10-CM

## 2021-07-05 DIAGNOSIS — M6281 Muscle weakness (generalized): Secondary | ICD-10-CM

## 2021-07-05 DIAGNOSIS — R2689 Other abnormalities of gait and mobility: Secondary | ICD-10-CM

## 2021-07-05 DIAGNOSIS — R278 Other lack of coordination: Secondary | ICD-10-CM
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
## 2021-07-13 ENCOUNTER — Encounter: Payer: Self-pay | Admitting: Physical Therapy

## 2021-07-13 ENCOUNTER — Ambulatory Visit: Payer: Self-pay | Attending: Physical Medicine and Rehabilitation | Admitting: Occupational Therapy

## 2021-07-13 ENCOUNTER — Ambulatory Visit: Payer: Self-pay | Admitting: Physical Therapy

## 2021-07-13 ENCOUNTER — Encounter: Payer: Self-pay | Admitting: Occupational Therapy

## 2021-07-13 ENCOUNTER — Other Ambulatory Visit: Payer: Self-pay

## 2021-07-13 DIAGNOSIS — R29898 Other symptoms and signs involving the musculoskeletal system: Secondary | ICD-10-CM | POA: Insufficient documentation

## 2021-07-13 DIAGNOSIS — S069X0A Unspecified intracranial injury without loss of consciousness, initial encounter: Secondary | ICD-10-CM | POA: Insufficient documentation

## 2021-07-13 DIAGNOSIS — R29818 Other symptoms and signs involving the nervous system: Secondary | ICD-10-CM

## 2021-07-13 DIAGNOSIS — M25512 Pain in left shoulder: Secondary | ICD-10-CM | POA: Insufficient documentation

## 2021-07-13 DIAGNOSIS — R2689 Other abnormalities of gait and mobility: Secondary | ICD-10-CM | POA: Insufficient documentation

## 2021-07-13 DIAGNOSIS — R2681 Unsteadiness on feet: Secondary | ICD-10-CM | POA: Insufficient documentation

## 2021-07-13 DIAGNOSIS — M6281 Muscle weakness (generalized): Secondary | ICD-10-CM | POA: Insufficient documentation

## 2021-07-13 DIAGNOSIS — R278 Other lack of coordination: Secondary | ICD-10-CM

## 2021-07-13 NOTE — Patient Instructions (Signed)
Access Code: [email or web address] URL: [email or web address] Date: 07/13/2021 Prepared by: Oley Balm  Exercises Side Stepping with Resistance at Feet - 1 x daily - 7 x weekly - 3 sets - 10 reps Backward Monster Walks - 1 x daily - 7 x weekly - 3 sets - 10 reps Towel Scrunches - 1 x daily - 7 x weekly - 1 sets - 10 reps Seated Toe Flexion Extension PROM - 1 x daily - 7 x weekly - 1 sets - 10 reps Ankle Inversion with Resistance - 1 x daily - 7 x weekly - 1 sets - 10 reps Ankle Eversion with Resistance - 1 x daily - 7 x weekly - 3 sets - 10 reps Ankle Dorsiflexion with Resistance - 1 x daily - 7 x weekly - 3 sets - 10 reps
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
## 2021-07-20 ENCOUNTER — Ambulatory Visit: Payer: Self-pay | Admitting: Physical Therapy

## 2021-08-18 ENCOUNTER — Other Ambulatory Visit: Payer: Self-pay | Admitting: Physical Medicine & Rehabilitation

## 2021-08-30 ENCOUNTER — Encounter: Payer: Self-pay | Admitting: Physical Medicine & Rehabilitation

## 2021-08-30 ENCOUNTER — Encounter: Payer: 59 | Attending: Physical Medicine & Rehabilitation | Admitting: Physical Medicine & Rehabilitation

## 2021-08-30 ENCOUNTER — Other Ambulatory Visit: Payer: Self-pay

## 2021-08-30 VITALS — BP 97/58 | HR 76 | Ht 71.0 in | Wt 156.4 lb

## 2021-08-30 DIAGNOSIS — R200 Anesthesia of skin: Secondary | ICD-10-CM | POA: Diagnosis not present

## 2021-08-30 DIAGNOSIS — Z87820 Personal history of traumatic brain injury: Secondary | ICD-10-CM | POA: Insufficient documentation

## 2021-08-30 DIAGNOSIS — R29898 Other symptoms and signs involving the musculoskeletal system: Secondary | ICD-10-CM | POA: Diagnosis present

## 2021-08-30 NOTE — Patient Instructions (Signed)
Ulnar Nerve Contusion Rehab Ask your health care provider which exercises are safe for you. Do exercises exactly as told by your health care provider and adjust them as directed. It is normal to feel mild stretching, pulling, tightness, or discomfort as you do these exercises. Stop right away if you feel sudden pain or your pain gets worse. Do not begin these exercises until told by your health care provider. Stretching and range-of-motion exercises These exercises warm up your muscles and joints and improve the movement and flexibility of your forearm. These exercises also help to relieve pain, numbness, and tingling. Wrist flexion, assisted  Stand over a tabletop with your left / right hand resting palm-up on the tabletop and your fingers pointing away from your body. Your arm should be extended, and there should be a slight bend in your elbow. Gently press the back of your hand down (flexion) onto the table by straightening your elbow. You should feel a stretch on the top of your forearm. Hold this position for __________ seconds. Slowly return to the starting position. Repeat __________ times. Complete this exercise __________ times a day. Wrist extension, assisted  Stand over a tabletop with your left / right hand resting palm-down on the tabletop and your fingers pointing away from your body. Your arm should be extended, and there should be a slight bend in your elbow. Gently press your fingers and palm down (extension) onto the table by straightening your elbow. You should feel a stretch on the inside of your forearm. Hold this position for __________ seconds. Slowly return to the starting position. Repeat __________ times. Complete this exercise __________ times a day. Ulnar nerve glide, hand moving Stand or sit with your left / right elbow bent and your hand at the height of your shoulder. Move your elbow about 6 inches (15 cm) away from your body. Gently and slowly wave your hand back  and forth (nerve glide). Repeat this motion for __________ seconds. Repeat __________ times. Complete this exercise __________ times a day. Ulnar nerve glide, elbow moving Stand or sit with your left / right arm straight out to your side at shoulder height. Bring your fingers up toward the ceiling so your palm faces away from you. Bend your elbow and bring it to your side and bend your wrist so your palm now faces the floor. Slowly go back and forth between doing the step 2 position and the step 3 position (elbow nerve glide). Repeat these motions for __________ seconds. Repeat __________ times. Complete this exercise __________ times a day. Strengthening exercise This exercise builds strength and endurance in your forearm. Endurance is the ability to use your muscles for a long time, even after they get tired. Grip  Hold one of these items in your left / right hand: a dense sponge, a tennis ball, or a large, rolled sock. Slowly squeeze as hard as you can without increasing any pain. Hold this position for __________ seconds. Slowly release your grip. Repeat __________ times. Complete this exercise __________ times a day. This information is not intended to replace advice given to you by your health care provider. Make sure you discuss any questions you have with your health care provider. Document Revised: 10/15/2018 Document Reviewed: 08/12/2018 Elsevier Patient Education  2022 [employer].

## 2021-08-30 NOTE — Progress Notes (Signed)
EMG/NCV LUE performed today see report in media.  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  Has evidence of reinnervation in Left ulnar intrinsic hand muscles.  F/u Dr Riley Kill. Repeat EMG in 6 mo if no clinical improvement

## 2021-12-07 ENCOUNTER — Encounter: Payer: 59 | Attending: Physical Medicine & Rehabilitation | Admitting: Physical Medicine & Rehabilitation

## 2021-12-19 ENCOUNTER — Other Ambulatory Visit: Payer: Self-pay | Admitting: Physical Medicine & Rehabilitation

## 2022-03-18 ENCOUNTER — Other Ambulatory Visit: Payer: Self-pay | Admitting: Physical Medicine & Rehabilitation

## 2022-07-22 IMAGING — DX DG CHEST 1V PORT
1 series · 1 of 1 positions shown · non-contrast
Comparison: 02/21/2021

CLINICAL DATA: Endotracheal tube, respiratory failure

EXAM:
PORTABLE CHEST 1 VIEW

[chest]
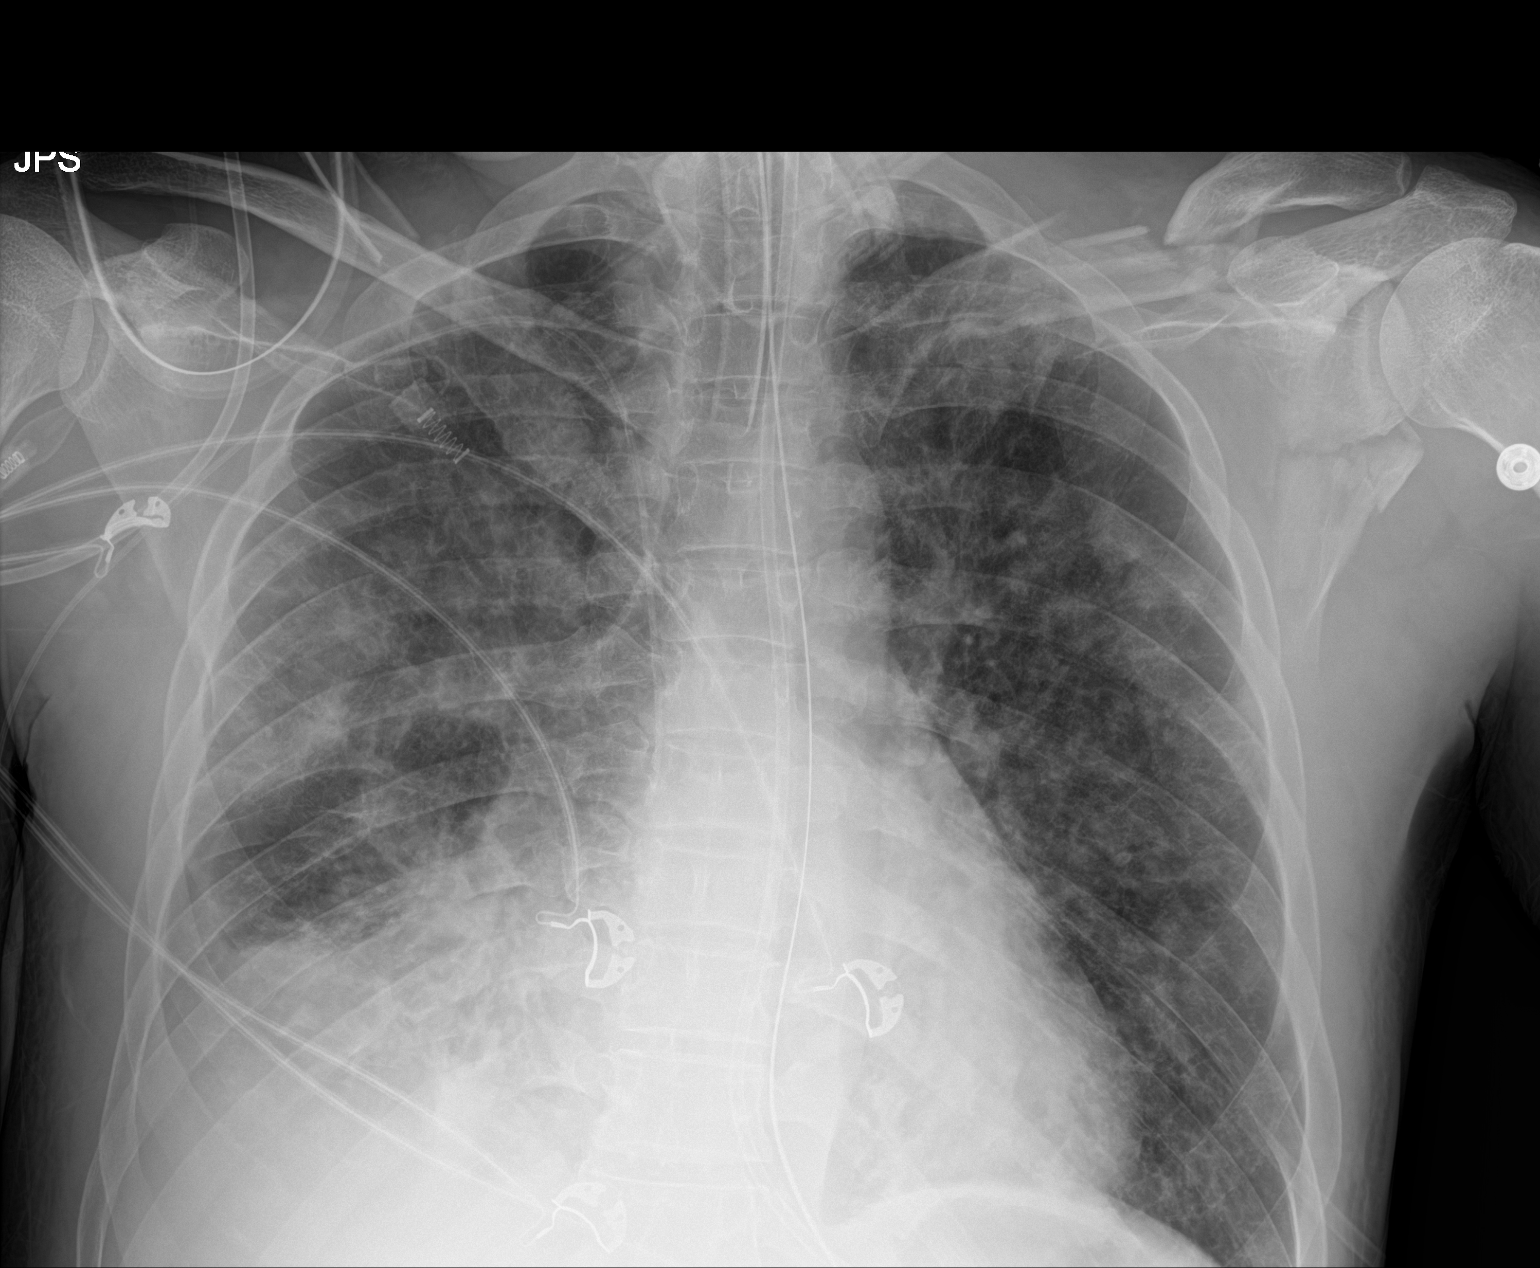

[1 of 1 positions shown; findings below may reference images not displayed]

FINDINGS: Endotracheal tube, right PICC line, and 2 enteric tubes are present.
Persistent but improved bilateral pulmonary opacities. Probable
small right pleural effusion. No pneumothorax. The left costophrenic
angle is partially excluded. Similar cardiomediastinal contours.
IMPRESSION: Persistent but improved bilateral pulmonary opacities. Probable
small right pleural effusion.
